# Patient Record
Sex: Female | Born: 1935
Health system: Southern US, Community
[De-identification: ages and names within clinical notes are randomized; demographics above are authoritative.]

## PROBLEM LIST (undated history)

## (undated) DIAGNOSIS — E079 Disorder of thyroid, unspecified: Secondary | ICD-10-CM

## (undated) DIAGNOSIS — I10 Essential (primary) hypertension: Secondary | ICD-10-CM

## (undated) DIAGNOSIS — K219 Gastro-esophageal reflux disease without esophagitis: Secondary | ICD-10-CM

## (undated) HISTORY — DX: Disorder of thyroid, unspecified: E07.9

## (undated) HISTORY — DX: Gastro-esophageal reflux disease without esophagitis: K21.9

## (undated) HISTORY — DX: Essential (primary) hypertension: I10

---

## 2005-07-24 ENCOUNTER — Other Ambulatory Visit: Payer: Self-pay

## 2005-07-24 ENCOUNTER — Inpatient Hospital Stay: Payer: Self-pay | Admitting: Surgery

## 2007-10-21 ENCOUNTER — Ambulatory Visit: Payer: Self-pay | Admitting: Family Medicine

## 2007-12-24 ENCOUNTER — Other Ambulatory Visit: Payer: Self-pay

## 2007-12-24 ENCOUNTER — Ambulatory Visit: Payer: Self-pay | Admitting: Obstetrics and Gynecology

## 2007-12-28 ENCOUNTER — Ambulatory Visit: Payer: Self-pay | Admitting: Obstetrics and Gynecology

## 2008-02-09 ENCOUNTER — Ambulatory Visit: Payer: Self-pay | Admitting: Obstetrics and Gynecology

## 2008-02-15 ENCOUNTER — Inpatient Hospital Stay: Payer: Self-pay | Admitting: Obstetrics and Gynecology

## 2008-11-01 ENCOUNTER — Ambulatory Visit: Payer: Self-pay | Admitting: Family Medicine

## 2014-04-28 DIAGNOSIS — E46 Unspecified protein-calorie malnutrition: Secondary | ICD-10-CM | POA: Diagnosis not present

## 2014-04-28 DIAGNOSIS — M810 Age-related osteoporosis without current pathological fracture: Secondary | ICD-10-CM | POA: Diagnosis not present

## 2014-04-28 DIAGNOSIS — E039 Hypothyroidism, unspecified: Secondary | ICD-10-CM | POA: Diagnosis not present

## 2014-04-28 DIAGNOSIS — K219 Gastro-esophageal reflux disease without esophagitis: Secondary | ICD-10-CM | POA: Diagnosis not present

## 2014-04-28 DIAGNOSIS — I10 Essential (primary) hypertension: Secondary | ICD-10-CM | POA: Diagnosis not present

## 2014-06-08 DIAGNOSIS — E119 Type 2 diabetes mellitus without complications: Secondary | ICD-10-CM | POA: Diagnosis not present

## 2014-06-08 DIAGNOSIS — H35039 Hypertensive retinopathy, unspecified eye: Secondary | ICD-10-CM | POA: Diagnosis not present

## 2014-06-08 DIAGNOSIS — I10 Essential (primary) hypertension: Secondary | ICD-10-CM | POA: Diagnosis not present

## 2014-06-08 DIAGNOSIS — Z961 Presence of intraocular lens: Secondary | ICD-10-CM | POA: Diagnosis not present

## 2014-09-16 ENCOUNTER — Other Ambulatory Visit: Payer: Self-pay | Admitting: Family Medicine

## 2014-09-16 MED ORDER — LEVOTHYROXINE SODIUM 125 MCG PO TABS: 125.0000 ug | ORAL_TABLET | Freq: Every day | ORAL | Status: DC

## 2014-10-31 ENCOUNTER — Encounter: Payer: Self-pay | Admitting: Family Medicine

## 2015-01-11 ENCOUNTER — Encounter: Payer: Self-pay | Admitting: Family Medicine

## 2015-01-27 ENCOUNTER — Other Ambulatory Visit: Payer: Self-pay | Admitting: Family Medicine

## 2015-02-13 ENCOUNTER — Other Ambulatory Visit: Payer: Self-pay | Admitting: Family Medicine

## 2015-02-22 ENCOUNTER — Other Ambulatory Visit: Payer: Self-pay | Admitting: Family Medicine

## 2015-03-18 ENCOUNTER — Other Ambulatory Visit: Payer: Self-pay | Admitting: Family Medicine

## 2015-03-28 ENCOUNTER — Encounter: Payer: Self-pay | Admitting: Family Medicine

## 2015-03-28 ENCOUNTER — Ambulatory Visit (INDEPENDENT_AMBULATORY_CARE_PROVIDER_SITE_OTHER): Payer: Medicare Other | Admitting: Family Medicine

## 2015-03-28 VITALS — BP 101/51 | HR 78 | Temp 97.3°F | Resp 16 | Ht 60.0 in | Wt 98.0 lb

## 2015-03-28 DIAGNOSIS — K219 Gastro-esophageal reflux disease without esophagitis: Secondary | ICD-10-CM | POA: Insufficient documentation

## 2015-03-28 DIAGNOSIS — I10 Essential (primary) hypertension: Secondary | ICD-10-CM | POA: Diagnosis not present

## 2015-03-28 DIAGNOSIS — R7303 Prediabetes: Secondary | ICD-10-CM

## 2015-03-28 DIAGNOSIS — Z23 Encounter for immunization: Secondary | ICD-10-CM | POA: Diagnosis not present

## 2015-03-28 DIAGNOSIS — M810 Age-related osteoporosis without current pathological fracture: Secondary | ICD-10-CM | POA: Diagnosis not present

## 2015-03-28 DIAGNOSIS — E039 Hypothyroidism, unspecified: Secondary | ICD-10-CM | POA: Diagnosis not present

## 2015-03-28 DIAGNOSIS — R7309 Other abnormal glucose: Secondary | ICD-10-CM | POA: Insufficient documentation

## 2015-03-28 LAB — POCT GLYCOSYLATED HEMOGLOBIN (HGB A1C): HEMOGLOBIN A1C: 5.9

## 2015-03-28 MED ORDER — LOSARTAN POTASSIUM 25 MG PO TABS: ORAL_TABLET | ORAL | Status: DC

## 2015-03-28 NOTE — Progress Notes (Signed)
Name: Megan RedderBarbara J Carpenter   MRN: [id number]    DOB: 01-Aug-1928   Date:03/28/2015       Progress Note  Subjective  Chief Complaint  Chief Complaint  Patient presents with  . Diabetes    Prediabetes 04/2013   A1C 6.3     HPI Here for f/u of pre-diabetes and hypothyroid.  Doing well.  Eats too many sweets.  Taking her meds.  No problem-specific assessment & plan notes found for this encounter.   Past Medical History  Diagnosis Date  . Hypertension   . Thyroid disease   . GERD (gastroesophageal reflux disease)     History reviewed. No pertinent past surgical history.  Family History  Problem Relation Age of Onset  . Family history unknown: Yes    Social History   Social History  . Marital Status: Married    Spouse Name: N/A  . Number of Children: N/A  . Years of Education: N/A   Occupational History  . Not on file.   Social History Main Topics  . Smoking status: Never Smoker   . Smokeless tobacco: Never Used  . Alcohol Use: No  . Drug Use: No  . Sexual Activity: Not on file   Other Topics Concern  . Not on file   Social History Narrative  . No narrative on file     Current outpatient prescriptions:  .  alendronate (FOSAMAX) 70 MG tablet, TAKE ONE TABLET BY MOUTH ONCE A WEEK AS DIRECTED, Disp: 4 tablet, Rfl: 6 .  levothyroxine (SYNTHROID, LEVOTHROID) 125 MCG tablet, TAKE ONE (1) TABLET BY MOUTH EVERY DAY BEFORE BREAKFAST, Disp: 35 tablet, Rfl: 0 .  omeprazole (PRILOSEC) 20 MG capsule, TAKE ONE (1) CAPSULE EACH DAY, Disp: 35 capsule, Rfl: 0 .  losartan (COZAAR) 25 MG tablet, Take 1/2 tablet each AM., Disp: 30 tablet, Rfl: 6  Not on File   Review of Systems  Constitutional: Negative for fever, chills, weight loss and malaise/fatigue.  HENT: Negative for hearing loss.   Eyes: Negative for blurred vision and double vision.  Respiratory: Negative for cough, shortness of breath and wheezing.   Cardiovascular: Negative for chest pain, palpitations and leg  swelling.  Gastrointestinal: Negative for heartburn, abdominal pain and blood in stool.  Genitourinary: Negative for dysuria, urgency and frequency.  Musculoskeletal: Negative for myalgias and joint pain.  Skin: Negative for rash.  Neurological: Negative for dizziness, weakness and headaches.      Objective  Filed Vitals:   03/28/15 0925  BP: 101/51  Pulse: 78  Temp: 97.3 F (36.3 C)  Resp: 16  Height: 5' (1.524 m)  Weight: 98 lb (44.453 kg)    Physical Exam  Constitutional: She is oriented to person, place, and time and well-developed, well-nourished, and in no distress. No distress.  HENT:  Head: Normocephalic and atraumatic.  Eyes: Conjunctivae and EOM are normal. Pupils are equal, round, and reactive to light. No scleral icterus.  Neck: Normal range of motion. Neck supple. Carotid bruit is not present. No thyromegaly present.  Cardiovascular: Normal rate, regular rhythm, normal heart sounds and intact distal pulses.  Exam reveals no gallop and no friction rub.   No murmur heard. Pulmonary/Chest: Effort normal and breath sounds normal. No respiratory distress. She has no wheezes. She has no rales.  Abdominal: Soft. Bowel sounds are normal. She exhibits no distension, no abdominal bruit and no mass. There is no tenderness.  Musculoskeletal: She exhibits no edema.  Lymphadenopathy:    She has no cervical  adenopathy.  Neurological: She is alert and oriented to person, place, and time. Coordination normal.  Vitals reviewed.      Recent Results (from the past 2160 hour(s))  POCT HgB A1C     Status: Normal   Collection Time: 03/28/15  9:51 AM  Result Value Ref Range   Hemoglobin A1C 5.9      Assessment & Plan  Problem List Items Addressed This Visit      Cardiovascular and Mediastinum   Hypertension   Relevant Medications   losartan (COZAAR) 25 MG tablet   Other Relevant Orders   Comprehensive Metabolic Panel (CMET)   Lipid Profile     Digestive   GERD  (gastroesophageal reflux disease)   Relevant Orders   CBC with Differential     Endocrine   Hypothyroidism   Relevant Orders   TSH     Musculoskeletal and Integument   Osteoporosis     Other   Prediabetes - Primary   Relevant Orders   POCT HgB A1C (Completed)    Other Visit Diagnoses    Need for influenza vaccination        Relevant Orders    Flu vaccine HIGH DOSE PF (Fluzone High dose) (Completed)    Need for pneumococcal vaccination        Relevant Orders    Pneumococcal polysaccharide vaccine 23-valent greater than or equal to 2yo subcutaneous/IM       Meds ordered this encounter  Medications  . DISCONTD: losartan (COZAAR) 25 MG tablet    Sig:   . losartan (COZAAR) 25 MG tablet    Sig: Take 1/2 tablet each AM.    Dispense:  30 tablet    Refill:  6   1. Prediabetes  - POCT HgB A1C-5.9  2. Gastroesophageal reflux disease without esophagitis -con t. meds - CBC with Differential  3. Essential hypertension  - losartan (COZAAR) 25 MG tablet; Take 1/2 tablet each AM.  Dispense: 30 tablet; Refill: 6 - Comprehensive Metabolic Panel (CMET) - Lipid Profile  4. Hypothyroidism, unspecified hypothyroidism type  - TSH  5. Osteoporosis -cont. meds  6. Need for influenza vaccination  - Flu vaccine HIGH DOSE PF (Fluzone High dose)  7. Need for pneumococcal vaccination  - Pneumococcal polysaccharide vaccine 23-valent greater than or equal to 2yo subcutaneous/IM

## 2015-03-30 DIAGNOSIS — K219 Gastro-esophageal reflux disease without esophagitis: Secondary | ICD-10-CM | POA: Diagnosis not present

## 2015-03-30 DIAGNOSIS — I10 Essential (primary) hypertension: Secondary | ICD-10-CM | POA: Diagnosis not present

## 2015-03-30 DIAGNOSIS — E039 Hypothyroidism, unspecified: Secondary | ICD-10-CM | POA: Diagnosis not present

## 2015-03-30 LAB — CBC WITH DIFFERENTIAL/PLATELET
BASOS: 0 %
Basophils Absolute: 0 10*3/uL (ref 0.0–0.2)
EOS (ABSOLUTE): 0.1 10*3/uL (ref 0.0–0.4)
EOS: 1 %
HEMATOCRIT: 42.2 % (ref 34.0–46.6)
HEMOGLOBIN: 14.9 g/dL (ref 11.1–15.9)
IMMATURE GRANS (ABS): 0 10*3/uL (ref 0.0–0.1)
IMMATURE GRANULOCYTES: 0 %
LYMPHS: 30 %
Lymphocytes Absolute: 2.3 10*3/uL (ref 0.7–3.1)
MCH: 30.7 pg (ref 26.6–33.0)
MCHC: 35.3 g/dL (ref 31.5–35.7)
MCV: 87 fL (ref 79–97)
Monocytes Absolute: 0.7 10*3/uL (ref 0.1–0.9)
Monocytes: 9 %
NEUTROS PCT: 60 %
Neutrophils Absolute: 4.5 10*3/uL (ref 1.4–7.0)
PLATELETS: 293 10*3/uL (ref 150–379)
RBC: 4.86 x10E6/uL (ref 3.77–5.28)
RDW: 13.1 % (ref 12.3–15.4)
WBC: 7.6 10*3/uL (ref 3.4–10.8)

## 2015-03-31 LAB — COMPREHENSIVE METABOLIC PANEL
A/G RATIO: 1.8 (ref 1.1–2.5)
ALT: 8 IU/L (ref 0–32)
AST: 13 IU/L (ref 0–40)
Albumin: 4.6 g/dL (ref 3.5–4.7)
Alkaline Phosphatase: 61 IU/L (ref 39–117)
BUN/Creatinine Ratio: 20 (ref 11–26)
BUN: 15 mg/dL (ref 8–27)
Bilirubin Total: 0.5 mg/dL (ref 0.0–1.2)
CALCIUM: 9.2 mg/dL (ref 8.7–10.3)
CHLORIDE: 102 mmol/L (ref 96–106)
CO2: 24 mmol/L (ref 18–29)
Creatinine, Ser: 0.75 mg/dL (ref 0.57–1.00)
GFR calc Af Amer: 85 mL/min/{1.73_m2} (ref >59)
GFR, EST NON AFRICAN AMERICAN: 73 mL/min/{1.73_m2} (ref >59)
Globulin, Total: 2.5 g/dL (ref 1.5–4.5)
Glucose: 113 mg/dL — ABNORMAL HIGH (ref 65–99)
POTASSIUM: 4.8 mmol/L (ref 3.5–5.2)
Sodium: 142 mmol/L (ref 134–144)
Total Protein: 7.1 g/dL (ref 6.0–8.5)

## 2015-03-31 LAB — LIPID PANEL
CHOL/HDL RATIO: 3.1 ratio (ref 0.0–4.4)
Cholesterol, Total: 175 mg/dL (ref 100–199)
HDL: 57 mg/dL (ref >39)
LDL Calculated: 102 mg/dL — ABNORMAL HIGH (ref 0–99)
TRIGLYCERIDES: 81 mg/dL (ref 0–149)
VLDL Cholesterol Cal: 16 mg/dL (ref 5–40)

## 2015-03-31 LAB — TSH: TSH: 2.6 u[IU]/mL (ref 0.450–4.500)

## 2015-04-04 ENCOUNTER — Other Ambulatory Visit: Payer: Self-pay | Admitting: Family Medicine

## 2015-10-18 ENCOUNTER — Other Ambulatory Visit: Payer: Self-pay | Admitting: Family Medicine

## 2015-11-27 ENCOUNTER — Ambulatory Visit (INDEPENDENT_AMBULATORY_CARE_PROVIDER_SITE_OTHER): Payer: Medicare Other | Admitting: Family Medicine

## 2015-11-27 ENCOUNTER — Encounter: Payer: Self-pay | Admitting: Family Medicine

## 2015-11-27 VITALS — BP 130/78 | HR 65 | Temp 97.8°F | Ht 60.0 in | Wt 91.0 lb

## 2015-11-27 DIAGNOSIS — I10 Essential (primary) hypertension: Secondary | ICD-10-CM | POA: Diagnosis not present

## 2015-11-27 DIAGNOSIS — E038 Other specified hypothyroidism: Secondary | ICD-10-CM

## 2015-11-27 DIAGNOSIS — E034 Atrophy of thyroid (acquired): Secondary | ICD-10-CM | POA: Diagnosis not present

## 2015-11-27 DIAGNOSIS — F0390 Unspecified dementia without behavioral disturbance: Secondary | ICD-10-CM | POA: Diagnosis not present

## 2015-11-27 DIAGNOSIS — M810 Age-related osteoporosis without current pathological fracture: Secondary | ICD-10-CM

## 2015-11-27 DIAGNOSIS — F02818 Dementia in other diseases classified elsewhere, unspecified severity, with other behavioral disturbance: Secondary | ICD-10-CM | POA: Insufficient documentation

## 2015-11-27 DIAGNOSIS — K219 Gastro-esophageal reflux disease without esophagitis: Secondary | ICD-10-CM | POA: Diagnosis not present

## 2015-11-27 DIAGNOSIS — R7303 Prediabetes: Secondary | ICD-10-CM

## 2015-11-27 DIAGNOSIS — F0281 Dementia in other diseases classified elsewhere with behavioral disturbance: Secondary | ICD-10-CM | POA: Insufficient documentation

## 2015-11-27 DIAGNOSIS — G301 Alzheimer's disease with late onset: Secondary | ICD-10-CM

## 2015-11-27 LAB — POCT GLYCOSYLATED HEMOGLOBIN (HGB A1C): Hemoglobin A1C: 6.2

## 2015-11-27 MED ORDER — DONEPEZIL HCL 5 MG PO TABS: 5.0000 mg | ORAL_TABLET | Freq: Every day | ORAL | 6 refills | Status: DC

## 2015-11-27 NOTE — Progress Notes (Signed)
Name: Megan RedderBarbara J Carpenter   MRN: [id number]    DOB: [birth year]   Date:11/27/2015       Progress Note  Subjective  Chief Complaint  Chief Complaint  Patient presents with  . Hypertension  . Hypothyroidism  . Dementia    HPI  Here for f/u of HBP and pre-diabetes.  Having more dementia sx.  Dementia sx are bothering daughter more than anything ewlse.  No problem-specific Assessment & Plan notes found for this encounter.   Past Medical History:  Diagnosis Date  . GERD (gastroesophageal reflux disease)   . Hypertension   . Thyroid disease     No past surgical history on file.  Family History  Problem Relation Age of Onset  . Family history unknown: Yes    Social History   Social History  . Marital status: Married    Spouse name: N/A  . Number of children: N/A  . Years of education: N/A   Occupational History  . Not on file.   Social History Main Topics  . Smoking status: Never Smoker  . Smokeless tobacco: Never Used  . Alcohol use No  . Drug use: No  . Sexual activity: Not on file   Other Topics Concern  . Not on file   Social History Narrative  . No narrative on file     Current Outpatient Prescriptions:  .  alendronate (FOSAMAX) 70 MG tablet, TAKE ONE TABLET BY MOUTH ONCE A WEEK AS DIRECTED, Disp: 4 tablet, Rfl: 6 .  levothyroxine (SYNTHROID, LEVOTHROID) 125 MCG tablet, TAKE ONE (1) TABLET BY MOUTH EVERY DAY BEFORE BREAKFAST, Disp: 35 tablet, Rfl: 6 .  losartan (COZAAR) 25 MG tablet, Take 1/2 tablet each AM., Disp: 30 tablet, Rfl: 6 .  omeprazole (PRILOSEC) 20 MG capsule, TAKE ONE CAPSULE BY MOUTH DAILY, Disp: 30 capsule, Rfl: 0 .  donepezil (ARICEPT) 5 MG tablet, Take 1 tablet (5 mg total) by mouth at bedtime., Disp: 30 tablet, Rfl: 6  Allergies  Allergen Reactions  . Penicillins Hives     Review of Systems  Constitutional: Positive for weight loss. Negative for chills, fever and malaise/fatigue.  HENT: Negative for hearing loss.   Eyes: Negative  for blurred vision and double vision.  Respiratory: Negative for cough, shortness of breath and wheezing.   Cardiovascular: Negative for chest pain, palpitations and leg swelling.  Gastrointestinal: Negative for abdominal pain, blood in stool and heartburn.  Genitourinary: Negative for dysuria, frequency and urgency.  Skin: Negative for rash.  Neurological: Negative for dizziness, tremors, weakness and headaches.      Objective  Vitals:   11/27/15 1102  BP: 130/78  Pulse: 65  Temp: 97.8 F (36.6 C)  TempSrc: Oral  Weight: 91 lb (41.3 kg)  Height: 5' (1.524 m)    Physical Exam  Constitutional: She is oriented to person, place, and time and well-developed, well-nourished, and in no distress. No distress.  HENT:  Head: Normocephalic and atraumatic.  Eyes: Conjunctivae and EOM are normal. Pupils are equal, round, and reactive to light. No scleral icterus.  Neck: Normal range of motion. Carotid bruit is not present.  Cardiovascular: Normal rate, regular rhythm and normal heart sounds.  Exam reveals no gallop and no friction rub.   No murmur heard. Pulmonary/Chest: Effort normal and breath sounds normal. No respiratory distress. She has no wheezes. She has no rales.  Abdominal: Soft. Bowel sounds are normal. She exhibits no distension and no mass. There is no tenderness.  Musculoskeletal: She exhibits no  edema.  Lymphadenopathy:    She has no cervical adenopathy.  Neurological: She is alert and oriented to person, place, and time.  Vitals reviewed.      Recent Results (from the past 2160 hour(s))  POCT HgB A1C     Status: None   Collection Time: 11/27/15 12:02 PM  Result Value Ref Range   Hemoglobin A1C 6.2      Assessment & Plan  Problem List Items Addressed This Visit      Cardiovascular and Mediastinum   Hypertension     Digestive   GERD (gastroesophageal reflux disease)     Endocrine   Hypothyroidism     Nervous and Auditory   Dementia   Relevant  Medications   donepezil (ARICEPT) 5 MG tablet     Musculoskeletal and Integument   Osteoporosis     Other   Prediabetes - Primary   Relevant Orders   POCT HgB A1C (Completed)    Other Visit Diagnoses   None.     Meds ordered this encounter  Medications  . donepezil (ARICEPT) 5 MG tablet    Sig: Take 1 tablet (5 mg total) by mouth at bedtime.    Dispense:  30 tablet    Refill:  6   1. Prediabetes  - POCT HgB A1C-6.2  2. Essential hypertension Cont med  3. Hypothyroidism due to acquired atrophy of thyroid Cont med  4. Gastroesophageal reflux disease without esophagitis Cont med 5. Osteoporosis Cont med  6. Dementia, without behavioral disturbance  - donepezil (ARICEPT) 5 MG tablet; Take 1 tablet (5 mg total) by mouth at bedtime.  Dispense: 30 tablet; Refill: 6

## 2015-11-27 NOTE — Patient Instructions (Addendum)
[facility] for [facility] during the day.

## 2015-11-29 ENCOUNTER — Other Ambulatory Visit: Payer: Self-pay | Admitting: *Deleted

## 2015-11-29 DIAGNOSIS — G3183 Dementia with Lewy bodies: Principal | ICD-10-CM

## 2015-11-29 DIAGNOSIS — F0280 Dementia in other diseases classified elsewhere without behavioral disturbance: Secondary | ICD-10-CM

## 2015-11-29 NOTE — Patient Outreach (Addendum)
[facility]) Care Management  11/29/2015  Megan RedderBarbara J Hogan 1929/03/16 [id number]   Subjective: Telephone call to patient's home/ mobile number, spoke with patient's daughter Larene Beach(Vickie Boltinghouse), states she is currently working, and patient can be reached at [phone].   RNCM advised must obtain authorization from patient prior to discussing the nature of call. Telephone to patient's home number ([phone]), spoke with patient, and HIPAA verified.   [facility] gave [facility] verbal authorization to speak with daughter Larene Beach(Vickie Boltinghouse) regarding her healthcare needs as needed.   Telephone call to patient's daughter Larene Beach(Vickie Boltinghouse), spoke with daughter, patient's date of birth, address, and name verified.  Discussed [facility] services, reason for referral, and patient's daughter in agreement to complete telephone screen. Patient's daughter states she has someone clean patient's house, take patient to lunch, and get her hair done once a week.  States patient has dementia and hypertension.  States patient ambulates without assistive device.  States patient has not had any recent  falls, inpatient hospitalizations, or ED visits.   Patient has no transportation, pharmacy, disease management, disease monitoring, or care coordination needs at this time.   Daughter in agreement to [facility] Social Worker referral for respite, personal care, community outing activities, and Architectural technologistcommunity resource identification.  States patient may not be eligible for Medicaid due to rental property assets and may need dementia care assisted living in the future.  States goal is to keep patient in patient's home as long as possible.  Daughter states she will be going out of town for 10 days in September and will need caregiver assistance. States she is in the process of arranging assistance while out of town.   Patient will [facility] to receive [facility] services.  Objective:  Per chart review: No recent hospitalizations or ED visit.  Patient has a history of hypertension, dementia, hypothyroidism, and pre-diabetes.  Patient's last visit with primary MD was on  11/27/15.    Assessment: Received MD referral on 11/27/15.   Referral source: Dr. Fidel LevyJames Hawkins Jr.   Referral reason: Dementia lives alone.    Telephone screen completed.  Patient will be referred to [facility] Care Management Social Worker for [employer] resources.   No Telephonic RNCM needs at this time.   Plan: RNCM will refer patient to [facility] Social Worker referral for respite, personal care, community outing activities, and Architectural technologistcommunity resource identification.    Ashima Shrake H. Gardiner Barefootooper RN, BSN, CCM [facility] Care Management [facility] Telephonic CM Phone: [phone] Fax: [phone]

## 2015-12-01 ENCOUNTER — Encounter: Payer: Self-pay | Admitting: *Deleted

## 2015-12-01 ENCOUNTER — Other Ambulatory Visit: Payer: Self-pay | Admitting: *Deleted

## 2015-12-01 NOTE — Patient Outreach (Signed)
[facility]) Care Management  12/01/2015  Megan Carpenter 11/17/1928 [id number]   Phone call to patient's daughter, with patient's consent to provide resources for in home support.  Per patient's daughter, she is looking for additional in home assistance for patient and options for outside activities. Assisted Living pursued in the past, however patient refused.  Per patient's daughter, they would like patient to remain in the home as long as possible, however is aware that  if out of home placement is needed, she would need memory care.   Per patient's daughter, patient lives alone and is beginning to require more supervision.  Patient has someone that will take her to lunch, get her hair done, and clean her home and per patient's daughter, she looks forward to the company. However the person that was taking her out recently broke her foot and will not be able to assist until she recovers.  Finances are limited as they are only working with patient's social security.  Eligibility for medicaid is questionable due to property that patient owns.  This Child psychotherapistsocial worker provided patient's daughter with the contact information for the [facility] [phone] as a possible options for support during the day.  [facility] [place] also discussed as an option.  Hiring a private duty aid also explored. Contacting [facility] [phone] for additional resources and respite also discussed.  Per patient's daughter, patient very hesitant to leave her home to the unknown, however she will look into the Adult Day Program.  Patient's daughter also plans to request that her family members take more of an active part in patient's care.  She also plans to possible increase the hours that her current help is providing on a private pay basis.  Patient's daughter has already spoke to [facility] and is working with them to obtain any available respite care  hours.  Patient's daughter verbalized having no further community resource needs and reports that she will call this social worker back if she has any additional questions or needs.  Plan: Case to be closed to [facility] care management at this time-community resources provided.  Adriana ReamsChrystal Land, LCSW [facility] Care Management [phone]

## 2015-12-06 ENCOUNTER — Other Ambulatory Visit: Payer: Self-pay | Admitting: Family Medicine

## 2016-01-29 ENCOUNTER — Ambulatory Visit: Payer: Medicare Other | Admitting: Family Medicine

## 2016-02-15 ENCOUNTER — Other Ambulatory Visit: Payer: Self-pay | Admitting: Family Medicine

## 2016-02-26 ENCOUNTER — Ambulatory Visit (INDEPENDENT_AMBULATORY_CARE_PROVIDER_SITE_OTHER): Payer: Medicare Other | Admitting: Family Medicine

## 2016-02-26 ENCOUNTER — Encounter: Payer: Self-pay | Admitting: Family Medicine

## 2016-02-26 VITALS — BP 137/81 | HR 75 | Temp 97.5°F | Resp 16 | Ht 60.0 in | Wt 93.0 lb

## 2016-02-26 DIAGNOSIS — R7303 Prediabetes: Secondary | ICD-10-CM

## 2016-02-26 DIAGNOSIS — E038 Other specified hypothyroidism: Secondary | ICD-10-CM | POA: Diagnosis not present

## 2016-02-26 DIAGNOSIS — K219 Gastro-esophageal reflux disease without esophagitis: Secondary | ICD-10-CM

## 2016-02-26 DIAGNOSIS — E063 Autoimmune thyroiditis: Secondary | ICD-10-CM | POA: Diagnosis not present

## 2016-02-26 DIAGNOSIS — F0390 Unspecified dementia without behavioral disturbance: Secondary | ICD-10-CM

## 2016-02-26 DIAGNOSIS — Z23 Encounter for immunization: Secondary | ICD-10-CM | POA: Diagnosis not present

## 2016-02-26 DIAGNOSIS — I10 Essential (primary) hypertension: Secondary | ICD-10-CM

## 2016-02-26 DIAGNOSIS — M8000XA Age-related osteoporosis with current pathological fracture, unspecified site, initial encounter for fracture: Secondary | ICD-10-CM

## 2016-02-26 NOTE — Progress Notes (Signed)
Name: Megan RedderBarbara J Carpenter   MRN: [id number]    DOB: 1929-02-24   Date:02/26/2016       Progress Note  Subjective  Chief Complaint  Chief Complaint  Patient presents with  . Weight Check    up 2 lbs since 11/2015  . Dementia    HPI Here to f/u weight and dementia.  Daughter and son decided not to start Aricept yet.  They will wait for now.  Patient ha eaten a little better since last seen.  Ancil BoozerFam ily is checking on Meals on Wheels also.  No problem-specific Assessment & Plan notes found for this encounter.   Past Medical History:  Diagnosis Date  . GERD (gastroesophageal reflux disease)   . Hypertension   . Thyroid disease     History reviewed. No pertinent surgical history.  Family History  Problem Relation Age of Onset  . Family history unknown: Yes    Social History   Social History  . Marital status: Married    Spouse name: N/A  . Number of children: N/A  . Years of education: N/A   Occupational History  . Not on file.   Social History Main Topics  . Smoking status: Never Smoker  . Smokeless tobacco: Never Used  . Alcohol use No  . Drug use: No  . Sexual activity: Not on file   Other Topics Concern  . Not on file   Social History Narrative  . No narrative on file     Current Outpatient Prescriptions:  .  alendronate (FOSAMAX) 70 MG tablet, TAKE 1 TABLET EACH WEEK OR AS DIRECTED, Disp: 4 tablet, Rfl: 12 .  levothyroxine (SYNTHROID, LEVOTHROID) 125 MCG tablet, TAKE ONE TABLET BY MOUTH EVERY DAY BEFORE BREAKFAST, Disp: 35 tablet, Rfl: 12 .  losartan (COZAAR) 25 MG tablet, Take 1/2 tablet each AM., Disp: 30 tablet, Rfl: 6 .  omeprazole (PRILOSEC) 20 MG capsule, TAKE ONE CAPSULE BY MOUTH DAILY, Disp: 30 capsule, Rfl: 0  Allergies  Allergen Reactions  . Penicillins Hives     Review of Systems  Constitutional: Negative for chills, fever, malaise/fatigue and weight loss.  HENT: Negative for hearing loss and tinnitus.   Eyes: Negative for blurred vision  and double vision.  Respiratory: Negative for cough, shortness of breath and wheezing.   Cardiovascular: Negative for chest pain, palpitations and leg swelling.  Gastrointestinal: Negative for abdominal pain, blood in stool and heartburn.  Genitourinary: Negative for dysuria, frequency and urgency.  Musculoskeletal: Negative for joint pain and myalgias.  Skin: Negative for rash.  Neurological: Negative for dizziness, tingling, tremors, weakness and headaches.  Psychiatric/Behavioral:       Mild dementia by hx.      Objective  Vitals:   02/26/16 1049  BP: 137/81  Pulse: 75  Resp: 16  Temp: 97.5 F (36.4 C)  TempSrc: Oral  Weight: 93 lb (42.2 kg)  Height: 5' (1.524 m)    Physical Exam  Constitutional: She is oriented to person, place, and time and well-developed, well-nourished, and in no distress. No distress.  HENT:  Head: Normocephalic and atraumatic.  Eyes: Conjunctivae and EOM are normal. Pupils are equal, round, and reactive to light. No scleral icterus.  Neck: Normal range of motion. Neck supple. Carotid bruit is not present. No thyromegaly present.  Cardiovascular: Normal rate, regular rhythm and normal heart sounds.  Exam reveals no gallop and no friction rub.   No murmur heard. Pulmonary/Chest: Effort normal and breath sounds normal. No respiratory distress. She has  no wheezes. She has no rales.  Abdominal: Soft. Bowel sounds are normal. She exhibits no distension and no mass. There is no tenderness.  Musculoskeletal: She exhibits no edema.  Lymphadenopathy:    She has no cervical adenopathy.  Neurological: She is alert and oriented to person, place, and time.  Vitals reviewed.      No results found for this or any previous visit (from the past 2160 hour(s)).   Assessment & Plan  Problem List Items Addressed This Visit      Cardiovascular and Mediastinum   Hypertension   Relevant Orders   COMPLETE METABOLIC PANEL WITH GFR     Digestive   GERD  (gastroesophageal reflux disease)   Relevant Orders   CBC with Differential     Endocrine   Hypothyroidism   Relevant Orders   TSH     Nervous and Auditory   Dementia     Musculoskeletal and Integument   Osteoporosis     Other   Prediabetes   Relevant Orders   HgB A1c    Other Visit Diagnoses    Need for vaccination    -  Primary   Relevant Orders   Flu vaccine HIGH DOSE PF (Fluzone High dose) (Completed)      No orders of the defined types were placed in this encounter.  1. Need for vaccination  - Flu vaccine HIGH DOSE PF (Fluzone High dose)  2. Essential hypertension Cont Losartan - COMPLETE METABOLIC PANEL WITH GFR  3. Gastroesophageal reflux disease without esophagitis Cont Omeprazole - CBC with Differential  4. Hypothyroidism due to Hashimoto's thyroiditis Cont Levothyroxine-  TSH  5. Dementia without behavioral disturbance, unspecified dementia type   6. Age-related osteoporosis with current pathological fracture, initial encounter Cont Fosamax  7. Prediabetes  - HgB A1c

## 2016-02-27 ENCOUNTER — Other Ambulatory Visit: Payer: Medicare Other

## 2016-02-28 LAB — CBC WITH DIFFERENTIAL/PLATELET
BASOS ABS: 0 {cells}/uL (ref 0–200)
Basophils Relative: 0 %
EOS PCT: 1 %
Eosinophils Absolute: 58 cells/uL (ref 15–500)
HCT: 43 % (ref 35.0–45.0)
Hemoglobin: 14.3 g/dL (ref 11.7–15.5)
LYMPHS ABS: 1566 {cells}/uL (ref 850–3900)
Lymphocytes Relative: 27 %
MCH: 29.7 pg (ref 27.0–33.0)
MCHC: 33.3 g/dL (ref 32.0–36.0)
MCV: 89.4 fL (ref 80.0–100.0)
MONOS PCT: 13 %
MPV: 10.3 fL (ref 7.5–12.5)
Monocytes Absolute: 754 cells/uL (ref 200–950)
NEUTROS PCT: 59 %
Neutro Abs: 3422 cells/uL (ref 1500–7800)
PLATELETS: 266 10*3/uL (ref 140–400)
RBC: 4.81 MIL/uL (ref 3.80–5.10)
RDW: 13.6 % (ref 11.0–15.0)
WBC: 5.8 10*3/uL (ref 3.8–10.8)

## 2016-02-28 LAB — COMPLETE METABOLIC PANEL WITH GFR
ALBUMIN: 4.3 g/dL (ref 3.6–5.1)
ALK PHOS: 59 U/L (ref 33–130)
ALT: 14 U/L (ref 6–29)
AST: 22 U/L (ref 10–35)
BILIRUBIN TOTAL: 0.5 mg/dL (ref 0.2–1.2)
BUN: 16 mg/dL (ref 7–25)
CALCIUM: 9.1 mg/dL (ref 8.6–10.4)
CO2: 28 mmol/L (ref 20–31)
Chloride: 101 mmol/L (ref 98–110)
Creat: 0.73 mg/dL (ref 0.60–0.88)
GFR, EST NON AFRICAN AMERICAN: 75 mL/min (ref >=60)
GFR, Est African American: 87 mL/min (ref >=60)
GLUCOSE: 163 mg/dL — AB (ref 65–99)
POTASSIUM: 4.3 mmol/L (ref 3.5–5.3)
SODIUM: 139 mmol/L (ref 135–146)
TOTAL PROTEIN: 6.8 g/dL (ref 6.1–8.1)

## 2016-02-28 LAB — TSH: TSH: 0.05 m[IU]/L — AB

## 2016-02-28 LAB — HEMOGLOBIN A1C
HEMOGLOBIN A1C: 5.9 % — AB (ref <5.7)
Mean Plasma Glucose: 123 mg/dL

## 2016-03-01 ENCOUNTER — Other Ambulatory Visit: Payer: Self-pay | Admitting: *Deleted

## 2016-03-01 MED ORDER — LEVOTHYROXINE SODIUM 100 MCG PO TABS: ORAL_TABLET | ORAL | 12 refills | Status: DC

## 2016-03-13 ENCOUNTER — Ambulatory Visit (INDEPENDENT_AMBULATORY_CARE_PROVIDER_SITE_OTHER): Payer: Medicare Other | Admitting: Family Medicine

## 2016-03-13 ENCOUNTER — Encounter: Payer: Self-pay | Admitting: Family Medicine

## 2016-03-13 VITALS — BP 107/71 | HR 68 | Temp 98.1°F | Resp 16 | Ht 60.0 in | Wt 94.0 lb

## 2016-03-13 DIAGNOSIS — H9193 Unspecified hearing loss, bilateral: Secondary | ICD-10-CM | POA: Diagnosis not present

## 2016-03-13 DIAGNOSIS — H6123 Impacted cerumen, bilateral: Secondary | ICD-10-CM

## 2016-03-13 NOTE — Assessment & Plan Note (Signed)
Significant amount of large thick impacted cerumen bilaterally, suspected primary cause of current reduced bilateral hearing and symptoms. Consider presbycusis in 6 yr patient, also some cognitive difficulty with alzheimer's dementia could be a factor but seems less likely, especially given history of more recent change over few weeks not gradual loss. No prior hearing aids or hearing evaluation.  Plan: 1. Attempted office ear lavage cerumen removal without much success, noticeable softening and partial removal, but still deeper cerumen impaction. 2. Trial on OTC Debrox drops removal kit and flushing at home 3. Notify office if not improving 2-4 weeks, may return for re-evaluation or as discussed can proceed with ENT referral for formal cerumen removal and consider audiology hearing evaluation

## 2016-03-13 NOTE — Patient Instructions (Signed)
Thank you for coming in to clinic today.  1. She has bilateral impacted ear wax (cerumen), it does appear to have loosened up and is much softer after the ear drops and the flushing, I was able to scrap small fragments out but the larger pieces are still deeper in.  Recommend using the same ear drops at home, over the counter Debrox (Carbamide peroxide), use on both sides, pharmacist will direct you to the appropriate ear drops if you need help. May take a week or more.  You may use a Q-tip only on outer ear rim if you see wax visible, try not to push deeper, you can use rolled up kleenex as a wick to absorb fluid and wax as well.  If you are not making progress let me know, call and leave a message and we can place referral to get her ears 100% clear, hopefully do not even need hearing aids or other testing, but this may be useful as well.  [facility] [address]  [place], [place] [address] Ph: [phone]  [facility] [address]  [place], [place] [address] Ph: [phone]  Linus Salmonshapman McQueen, MD Marion DownerScott Bennett, MD Bud Facereighton Vaught, MD  Please schedule a follow-up appointment with Dr. Althea CharonKaramalegos or Dr Juanetta GoslingHawkins as needed within 4 weeks for ear wax or hearing loss  If you have any other questions or concerns, please feel free to call the clinic or send a message through MyChart. You may also schedule an earlier appointment if necessary.  Saralyn PilarAlexander Karamalegos, DO [facility]

## 2016-03-13 NOTE — Progress Notes (Signed)
Subjective:    Patient ID: Megan Carpenter, female    DOB: July 24, 1928, 82 y.o.   MRN: [id number]  Megan Carpenter is a 82 y.o. female presenting on 03/13/2016 for Ear Pain (as per Daughter can't hear noticed from last couple of days may need referral to ENT)  Daughter Megan Carpenter [facility]) provided history primarily for patient, patient provides some history as well, limited by dementia.  HPI  REDUCED HEARING, Bilateral / Ear Wax: - Reports that patient has had worsening hearing difficulty over past 2-3 weeks, which is a new change as she previously did not have significant problems with hearing previously. Hearing with conversational speech is difficult currently, family has to repeat things 2-3 times, and patient admits to difficulty hearing them and hard for her to hear herself clearly as well with some muffled sounds. - PMH Alzheimer's Dementia, lives by herself has [facility] aides and caregivers - Previously established with [facility] several years ago for dysphagia secondary to paralyzed vocal cord, has never had hearing evaluation. No prior hearing aids. She did require wax cleaning years ago. - Denies any tinnitus, ear pressure pain or drainage, headache, fever/chills, ear or head injury, numbness, tingling, dizziness, vertigo  Social History  Substance Use Topics  . Smoking status: Never Smoker  . Smokeless tobacco: Never Used  . Alcohol use No    Review of Systems Per HPI unless specifically indicated above     Objective:    BP 107/71   Pulse 68   Temp 98.1 F (36.7 C) (Oral)   Resp 16   Ht 5' (1.524 m)   Wt 94 lb (42.6 kg)   BMI 18.36 kg/m   Wt Readings from Last 3 Encounters:  03/13/16 94 lb (42.6 kg)  02/26/16 93 lb (42.2 kg)  11/27/15 91 lb (41.3 kg)    Physical Exam  Constitutional: She appears well-developed and well-nourished. No distress.  Well-appearing, comfortable, cooperative  HENT:  Head: Normocephalic and atraumatic.  Frontal / maxillary sinuses  non-tender. Nares patent without purulence or edema. Oropharynx clear without erythema, exudates, edema or asymmetry.   Bilateral TMs not visible obstructed by significant amount of hard thick impacted cerumen. External ears and mastoid non tender. --------- S/p treatment by nurse with Ear Drops (Carbamide peroxide), and Ear Flushing (warm water, hydrogen peroxide) - Significant softening of ear wax with some removal but still deeper thick impacted cerumen. Attempted curette removal, only partially removed limited results.  Cardiovascular: Normal rate.   Pulmonary/Chest: Effort normal.  Neurological: She is alert.  Skin: Skin is warm and dry. No rash noted. She is not diaphoretic. No erythema.  Psychiatric: Her behavior is normal.  Nursing note and vitals reviewed.   I have personally reviewed the following lab results from 02/26/16.  Results for orders placed or performed in visit on 02/26/16  COMPLETE METABOLIC PANEL WITH GFR  Result Value Ref Range   Sodium 139 135 - 146 mmol/L   Potassium 4.3 3.5 - 5.3 mmol/L   Chloride 101 98 - 110 mmol/L   CO2 28 20 - 31 mmol/L   Glucose, Bld 163 (H) 65 - 99 mg/dL   BUN 16 7 - 25 mg/dL   Creat 0.73 0.60 - 0.88 mg/dL   Total Bilirubin 0.5 0.2 - 1.2 mg/dL   Alkaline Phosphatase 59 33 - 130 U/L   AST 22 10 - 35 U/L   ALT 14 6 - 29 U/L   Total Protein 6.8 6.1 - 8.1 g/dL   Albumin  4.3 3.6 - 5.1 g/dL   Calcium 9.1 8.6 - 10.4 mg/dL   GFR, Est African American 87 >=60 mL/min   GFR, Est Non African American 75 >=60 mL/min  CBC with Differential  Result Value Ref Range   WBC 5.8 3.8 - 10.8 K/uL   RBC 4.81 3.80 - 5.10 MIL/uL   Hemoglobin 14.3 11.7 - 15.5 g/dL   HCT 43.0 35.0 - 45.0 %   MCV 89.4 80.0 - 100.0 fL   MCH 29.7 27.0 - 33.0 pg   MCHC 33.3 32.0 - 36.0 g/dL   RDW 13.6 11.0 - 15.0 %   Platelets 266 140 - 400 K/uL   MPV 10.3 7.5 - 12.5 fL   Neutro Abs 3,422 1,500 - 7,800 cells/uL   Lymphs Abs 1,566 850 - 3,900 cells/uL   Monocytes  Absolute 754 200 - 950 cells/uL   Eosinophils Absolute 58 15 - 500 cells/uL   Basophils Absolute 0 0 - 200 cells/uL   Neutrophils Relative % 59 %   Lymphocytes Relative 27 %   Monocytes Relative 13 %   Eosinophils Relative 1 %   Basophils Relative 0 %   Smear Review Criteria for review not met   TSH  Result Value Ref Range   TSH 0.05 (L) mIU/L  HgB A1c  Result Value Ref Range   Hgb A1c MFr Bld 5.9 (H) <5.7 %   Mean Plasma Glucose 123 mg/dL      Assessment & Plan:   Problem List Items Addressed This Visit    Impacted cerumen of both ears - Primary    Significant amount of large thick impacted cerumen bilaterally, suspected primary cause of current reduced bilateral hearing and symptoms. Consider presbycusis in 23 yr patient, also some cognitive difficulty with alzheimer's dementia could be a factor but seems less likely, especially given history of more recent change over few weeks not gradual loss. No prior hearing aids or hearing evaluation.  Plan: 1. Attempted office ear lavage cerumen removal without much success, noticeable softening and partial removal, but still deeper cerumen impaction. 2. Trial on OTC Debrox drops removal kit and flushing at home 3. Notify office if not improving 2-4 weeks, may return for re-evaluation or as discussed can proceed with ENT referral for formal cerumen removal and consider audiology hearing evaluation      Hearing reduced, bilateral    Suspected secondary to thick impacted cerumen bilaterally. See A&P         No orders of the defined types were placed in this encounter.     Follow up plan: Return in about 4 weeks (around 04/10/2016), or if symptoms worsen or fail to improve, for ear wax, hearing loss.  Megan Putnam, DO [facility] 03/13/2016, 10:43 AM

## 2016-03-13 NOTE — Assessment & Plan Note (Signed)
Suspected secondary to thick impacted cerumen bilaterally. See A&P

## 2016-03-27 DIAGNOSIS — H6123 Impacted cerumen, bilateral: Secondary | ICD-10-CM | POA: Diagnosis not present

## 2016-03-27 DIAGNOSIS — H903 Sensorineural hearing loss, bilateral: Secondary | ICD-10-CM | POA: Diagnosis not present

## 2016-05-20 ENCOUNTER — Other Ambulatory Visit: Payer: Self-pay | Admitting: Family Medicine

## 2016-05-20 DIAGNOSIS — I10 Essential (primary) hypertension: Secondary | ICD-10-CM

## 2016-05-28 ENCOUNTER — Ambulatory Visit: Payer: Medicare Other | Admitting: Family Medicine

## 2016-06-04 ENCOUNTER — Ambulatory Visit: Payer: Medicare Other | Admitting: Family Medicine

## 2016-07-01 ENCOUNTER — Ambulatory Visit: Payer: Medicare Other | Admitting: Family Medicine

## 2016-07-02 ENCOUNTER — Ambulatory Visit (INDEPENDENT_AMBULATORY_CARE_PROVIDER_SITE_OTHER): Payer: Medicare Other | Admitting: Family Medicine

## 2016-07-02 ENCOUNTER — Encounter: Payer: Self-pay | Admitting: Family Medicine

## 2016-07-02 VITALS — BP 123/72 | HR 89 | Temp 97.7°F | Resp 16 | Ht 60.0 in | Wt 94.6 lb

## 2016-07-02 DIAGNOSIS — R7303 Prediabetes: Secondary | ICD-10-CM

## 2016-07-02 DIAGNOSIS — E038 Other specified hypothyroidism: Secondary | ICD-10-CM | POA: Diagnosis not present

## 2016-07-02 DIAGNOSIS — E440 Moderate protein-calorie malnutrition: Secondary | ICD-10-CM | POA: Diagnosis not present

## 2016-07-02 DIAGNOSIS — F0281 Dementia in other diseases classified elsewhere with behavioral disturbance: Secondary | ICD-10-CM | POA: Diagnosis not present

## 2016-07-02 DIAGNOSIS — I10 Essential (primary) hypertension: Secondary | ICD-10-CM

## 2016-07-02 DIAGNOSIS — G301 Alzheimer's disease with late onset: Secondary | ICD-10-CM

## 2016-07-02 DIAGNOSIS — K219 Gastro-esophageal reflux disease without esophagitis: Secondary | ICD-10-CM

## 2016-07-02 DIAGNOSIS — F02818 Dementia in other diseases classified elsewhere, unspecified severity, with other behavioral disturbance: Secondary | ICD-10-CM

## 2016-07-02 DIAGNOSIS — E063 Autoimmune thyroiditis: Secondary | ICD-10-CM | POA: Diagnosis not present

## 2016-07-02 DIAGNOSIS — E43 Unspecified severe protein-calorie malnutrition: Secondary | ICD-10-CM | POA: Insufficient documentation

## 2016-07-02 LAB — POCT GLYCOSYLATED HEMOGLOBIN (HGB A1C): HEMOGLOBIN A1C: 6.2

## 2016-07-02 MED ORDER — OMEPRAZOLE 20 MG PO CPDR: 20.0000 mg | DELAYED_RELEASE_CAPSULE | Freq: Every day | ORAL | 3 refills | Status: DC

## 2016-07-02 NOTE — Assessment & Plan Note (Signed)
Last TSH 02/2016 with low abnormal, Levothyroxine down to 100mcg, did not follow-up with TSH re-check. Clinically doing well on current dose  Plan: 1. Continue Levothryoxine 100mcg daily 2. Check future TSH, Free T4 with all other labs 6 months

## 2016-07-02 NOTE — Assessment & Plan Note (Signed)
Stable Refilled Omeprazole for PRN flare use only, to reduce symptoms of heartburn or hoarseness if worsening again

## 2016-07-02 NOTE — Patient Instructions (Signed)
Thank you for coming in to clinic today.  1. Pre-Diabetes is controlled - A1c stable today 6.2, previously 5.9 over past 2 years - No need to change diet and lifestyle, continue regular diet - No new medications - We can start checking this A1c every 6 months for now  2. Blood pressure is well controlled - For BP and kidney protection, recommend to continue half tab Losartan 25mg  - try to use a pill cutter for this one  3. For stomach acid, refilled the Omeprazole only take as needed if flare up, can take for 2-4 weeks every day 30 min before 1st meal of day  4. For dementia  [facility] (require skilled medical need for nursing / PT, "homebound") [address] [place], [place] [address] Ph [phone] [email or web address]  Services Skilled RN, PT, OT, [employer] Aide, CSW, SLP  [facility] Zerita BoersHeather McKay (OT, Dementia Specialist) [address] [place], [place] [address] Ph [phone] [email or web address] - Navigate home and facility services for patients, caregiver support & resources  ---------------------- [facility] Address: [address], [place], [place] [address] Phone: [phone] ------------------------  HOME HEALTH  [facility] Health Care) [address] [place], [place] [address] [email or web address]  Lenn CalChristine Ray [phone] office  You will be due for FASTING BLOOD WORK (no food or drink after midnight before, only water or coffee without cream/sugar on the morning of)  - Please go ahead and schedule a "Lab Only" visit in the morning at the clinic for lab draw in 6 months before next Annual Physical - Make sure Lab Only appointment is at least 1-2 weeks before your next appointment, so that results will be available  For Lab Results, once available within 2-3 days of blood draw, you can can log in to MyChart online to view your results and a brief explanation. Also, we can discuss  results at next follow-up visit.   Please schedule a follow-up appointment with Dr. Althea CharonKaramalegos in 6 months for Annual Physical / Dementia / Pre-DM / HTN  If you have any other questions or concerns, please feel free to call the clinic or send a message through MyChart. You may also schedule an earlier appointment if necessary.  Saralyn PilarAlexander Altheia Shafran, DO [facility]

## 2016-07-02 NOTE — Assessment & Plan Note (Signed)
Stable Pre-DM, today A1c 6.2, recently has been 5.9 to 6.2 in past 2 yr No current management, no medications, and no diet/lifestyle changes, eats regular diet  Plan: 1. Reassurance, continue without any aggressive intervention, advised given age 83 and dementia, even if was dx with Type 2 DM, goal for A1c would be < 8.0, now advised more important to maintain appropriate nutrition, PO intake, and do not limit carbs - see A&P 2. Space out A1c q 6 months 3. Follow-up 6 months Annual Physical, A1c, labs

## 2016-07-02 NOTE — Progress Notes (Signed)
Subjective:    Patient ID: Megan Carpenter, female    DOB: September 24, 1928, 83 y.o.   MRN: [id number]  Megan Carpenter is a [age] y.o. female presenting on 07/02/2016 for Hypertension  Daughter Megan BoerVicki [facility]) provided history primarily for patient, patient provides some history as well, limited by dementia.  HPI   Pre-Diabetes: No concerns today. Prior A1c stable 5.9 to 6.2 CBGs: Does not check CBGs Meds: None Currently on ARB Lifestyle: Regular diet, does not follow DM diet, see below, goals to maintain and gain wt given poor nutritional status Denies hypoglycemia, polyuria, visual changes, numbness or tingling.  CHRONIC HTN: Reports no concerns. Does not check BP regularly at home. Current Meds - Losartan 25mg  (half tab for dose 12.5mg  daily) Reports good compliance, took med today. Tolerating well, w/o complaints. Denies CP, dyspnea, HA, edema, dizziness / lightheadedness  GERD, Hoarseness - Prior history of GERD in past had done well with PPI PRN only. Not taking chronic antacid. Also reports history of previous hoarseness voice, saw [facility]. - Currently of PPI, without admitting to symptoms of heartburn - Denies abdominal pain, heartburn, nausea, vomiting  DEMENTIA, with behavior disturbances / Protein Calorie Malnutrition - Chronic dementia, Alzheimer's type, gradual worsening, primarily managed by daughter as caregiver, other assistance is limited. In [facility], now she is limited coverage, mostly self pay, has hired other help at home for cleaning and other things but limited. In past had contacted [facility] and [facility] for [facility]. - Now has had Meals on Wheels, just recently started after on waiting list for a while, [facility], seems to be a very positive change - Taking ensure supplement once daily - Admits some behavioral changes occasionally, some wandering no significant agitation or violent  behavior, not considered harm to self or others - Admits reduced appetite and hydration, often can forget meals or not drink water regularly - Denies significant mood changes, depression  Hypothyroidism Continues Levothyroxine 100mcg daily, adjusted 02/2016 with low TSH.  Social History  Substance Use Topics  . Smoking status: Never Smoker  . Smokeless tobacco: Never Used  . Alcohol use No    Review of Systems Per HPI unless specifically indicated above     Objective:    BP 123/72   Pulse 89   Temp 97.7 F (36.5 C) (Oral)   Resp 16   Ht 5' (1.524 m)   Wt 94 lb 9.6 oz (42.9 kg)   BMI 18.48 kg/m   Wt Readings from Last 3 Encounters:  07/02/16 94 lb 9.6 oz (42.9 kg)  03/13/16 94 lb (42.6 kg)  02/26/16 93 lb (42.2 kg)    Physical Exam  Constitutional: She appears well-developed and well-nourished. No distress.  Pleasant 83 year female, chronically ill but currently well appearing, comfortable, cooperative  HENT:  Head: Normocephalic and atraumatic.  Frontal / maxillary sinuses non-tender. Nares patent without purulence or edema. Bilateral TMs clear without erythema, effusion or bulging, resolved cerumen impaction since last visit. Oropharynx clear without erythema, exudates, edema or asymmetry.  Mild oral mucosal tongue dryness, poor dentition  Cardiovascular: Normal rate.   Pulmonary/Chest: Effort normal.  Neurological: She is alert.  Skin: Skin is warm and dry. No rash noted. She is not diaphoretic. No erythema.  Psychiatric: Her behavior is normal.  Nursing note and vitals reviewed.   Nutrition Focused Physical Exam:  Subcutaneous Fat:  Orbital Region: Reduced Upper Arm Region:  Reduced Thoracic and Lumbar Region: Reduced  Muscle:  Temple Region: Mild to moderate atrophy Anterior Thigh Region: Mild to moderate atrophy  Edema: None  Results for orders placed or performed in visit on 07/02/16  POCT HgB A1C  Result Value Ref Range   Hemoglobin A1C 6.2        Assessment & Plan:   Problem List Items Addressed This Visit    Protein-calorie malnutrition, moderate (HCC)    Clinically consistent with protein calorie malnutrition based on some reduced subQ fatty tissue and muscle atrophy, BMI >18, no other systemic symptoms, in setting of dementia, reduced appetite.  Plan: 1. Continue ensure daily, may increase to twice daily if needed 2. Continue meals on wheels, caregiver support for regular meal intake 3. Follow-up 6 months for annual physical, future labs, consider meds such as Mirtazapine to stimulate appetite if needed      Relevant Orders   Lipid panel   Prediabetes - Primary    Stable Pre-DM, today A1c 6.2, recently has been 5.9 to 6.2 in past 2 yr No current management, no medications, and no diet/lifestyle changes, eats regular diet  Plan: 1. Reassurance, continue without any aggressive intervention, advised given age 83 and dementia, even if was dx with Type 2 DM, goal for A1c would be < 8.0, now advised more important to maintain appropriate nutrition, PO intake, and do not limit carbs - see A&P 2. Space out A1c q 6 months 3. Follow-up 6 months Annual Physical, A1c, labs      Relevant Orders   POCT HgB A1C (Completed)   Hemoglobin A1c   Hypothyroidism    Last TSH 02/2016 with low abnormal, Levothyroxine down to , did not follow-up with TSH re-check. Clinically doing well on current dose  Plan: 1. Continue Levothryoxine daily 2. Check future TSH, Free T4 with all other labs 6 months      Relevant Orders   TSH   T4, free   Hypertension    Stable, well controlled Continue Losartan half 25mg  tab (12.5mg  dose) daily - advised to obtain pill cutter for easier use Follow-up 6 months, future labs      Relevant Orders   COMPLETE METABOLIC PANEL WITH GFR   Lipid panel   GERD (gastroesophageal reflux disease)    Stable Refilled Omeprazole for PRN flare use only, to reduce symptoms of heartburn or hoarseness if  worsening again      Relevant Medications   omeprazole (PRILOSEC) 20 MG capsule   Dementia    Currently stable, with history of progressive Alzheimer's dementia, clinically FAST Stage 6 (needs help with ADLs), some history of behavioral disturbance without evidence of harm or danger to self/others. - Increased caregiver burden, limited financial and availability, previous on HH, and other resources - Meds: prior on Aricept, now off  Plan: 1. Reviewed Dementia prognosis and management, explained that may need additional support in future. Provided resources and contact info for [facility] [place] [facility], [facility] from hospice, and [facility] as initial options (in past had used [facility] for other resources), advised daughter (primary caregiver) may need to consider ALF vs more advanced care if needed      Relevant Orders   COMPLETE METABOLIC PANEL WITH GFR      Meds ordered this encounter  Medications  . omeprazole (PRILOSEC) 20 MG capsule    Sig: Take 1 capsule (20 mg total) by mouth daily before breakfast. Take for 2-4 weeks at  a time, only if needed for worsening acid reflux    Dispense:  30 capsule    Refill:  3      Follow up plan: Return in about 6 months (around 01/02/2017) for Annual Physical.  Saralyn Pilar, DO [facility] 07/02/2016, 10:06 PM

## 2016-07-02 NOTE — Assessment & Plan Note (Signed)
Clinically consistent with protein calorie malnutrition based on some reduced subQ fatty tissue and muscle atrophy, BMI >18, no other systemic symptoms, in setting of dementia, reduced appetite.  Plan: 1. Continue ensure daily, may increase to twice daily if needed 2. Continue meals on wheels, caregiver support for regular meal intake 3. Follow-up 6 months for annual physical, future labs, consider meds such as Mirtazapine to stimulate appetite if needed

## 2016-07-02 NOTE — Assessment & Plan Note (Signed)
Stable, well controlled Continue Losartan half 25mg  tab (12.5mg  dose) daily - advised to obtain pill cutter for easier use Follow-up 6 months, future labs

## 2016-07-02 NOTE — Assessment & Plan Note (Signed)
Currently stable, with history of progressive Alzheimer's dementia, clinically FAST Stage 6 (needs help with ADLs), some history of behavioral disturbance without evidence of harm or danger to self/others. - Increased caregiver burden, limited financial and availability, previous on HH, and other resources - Meds: prior on Aricept, now off  Plan: 1. Reviewed Dementia prognosis and management, explained that may need additional support in future. Provided resources and contact info for [facility] [place] [facility], [facility] from hosp[facility], and [facility] as initial options ([facility]), advised daughter (primary caregiver) may need to consider ALF vs more advanced care if needed

## 2016-12-31 ENCOUNTER — Other Ambulatory Visit: Payer: Self-pay | Admitting: Family Medicine

## 2017-01-02 ENCOUNTER — Other Ambulatory Visit: Payer: Self-pay | Admitting: Family Medicine

## 2017-01-02 DIAGNOSIS — K219 Gastro-esophageal reflux disease without esophagitis: Secondary | ICD-10-CM

## 2017-04-02 ENCOUNTER — Other Ambulatory Visit: Payer: Self-pay

## 2017-04-02 MED ORDER — LEVOTHYROXINE SODIUM 100 MCG PO TABS: ORAL_TABLET | ORAL | 0 refills | Status: DC

## 2017-05-09 ENCOUNTER — Encounter: Payer: Medicare Other | Attending: Physician Assistant | Admitting: Physician Assistant

## 2017-05-09 DIAGNOSIS — I10 Essential (primary) hypertension: Secondary | ICD-10-CM | POA: Insufficient documentation

## 2017-05-09 DIAGNOSIS — R7303 Prediabetes: Secondary | ICD-10-CM | POA: Diagnosis not present

## 2017-05-09 DIAGNOSIS — S91301A Unspecified open wound, right foot, initial encounter: Secondary | ICD-10-CM | POA: Insufficient documentation

## 2017-05-09 DIAGNOSIS — Z880 Allergy status to penicillin: Secondary | ICD-10-CM | POA: Diagnosis not present

## 2017-05-09 DIAGNOSIS — X58XXXA Exposure to other specified factors, initial encounter: Secondary | ICD-10-CM | POA: Diagnosis not present

## 2017-05-09 DIAGNOSIS — L97512 Non-pressure chronic ulcer of other part of right foot with fat layer exposed: Secondary | ICD-10-CM | POA: Diagnosis present

## 2017-05-09 DIAGNOSIS — F0390 Unspecified dementia without behavioral disturbance: Secondary | ICD-10-CM | POA: Diagnosis not present

## 2017-05-09 DIAGNOSIS — E440 Moderate protein-calorie malnutrition: Secondary | ICD-10-CM | POA: Insufficient documentation

## 2017-05-11 NOTE — Progress Notes (Addendum)
[name], [name] ([id number]) Visit Report for 05/09/2017 Chief Complaint Document Details Patient Name: Megan Carpenter, Megan Carpenter. Date of Service: 05/09/2017 8:00 AM Medical Record Number: [id number] Patient Account Number: [id number] Date of Birth/Sex: Dec 06, 1928 (84 y.o. Female) Treating RN: Huel Coventry Primary Care Provider: Jonna Clark, Fayrene Fearing Other Clinician: Referring Provider: Jonna Clark, Fayrene Fearing Treating Provider/Extender: Linwood Dibbles, HOYT Weeks in Treatment: 0 Information Obtained from: Patient Electronic Signature(s) Signed: 05/09/2017 4:49:03 PM By: Lenda Kelp PA-C Entered By: Lenda Kelp on 05/09/2017 09:26:18 Megan Carpenter ([id number]) -------------------------------------------------------------------------------- Debridement Details Patient Name: Megan Carpenter Date of Service: 05/09/2017 8:00 AM Medical Record Number: [id number] Patient Account Number: [id number] Date of Birth/Sex: [birth year] (84 y.o. Female) Treating RN: Huel Coventry Primary Care Provider: Jonna Clark, Fayrene Fearing Other Clinician: Referring Provider: Jonna Clark, Fayrene Fearing Treating Provider/Extender: Linwood Dibbles, HOYT Weeks in Treatment: 0 Debridement Performed for Wound #1 Right,Lateral Ankle Assessment: Performed By: Physician STONE III, HOYT E., PA-C Debridement: Debridement Pre-procedure Verification/Time Yes - 09:13 Out Taken: Start Time: 09:14 Pain Control: Lidocaine 4% Topical Solution Level: Skin/Subcutaneous Tissue Total Area Debrided (L x W): 0.5 (cm) x 0.7 (cm) = 0.35 (cm) Tissue and other material Viable, Non-Viable, Exudate, Fibrin/Slough, Subcutaneous debrided: Instrument: Curette Bleeding: Minimum Hemostasis Achieved: Pressure End Time: 09:18 Procedural Pain: 0 Post Procedural Pain: 0 Response to Treatment: Procedure was tolerated well Post Debridement Measurements of Total Wound Length: (cm) 0.5 Width: (cm) 0.7 Depth: (cm) 0.5 Volume: (cm) 0.137 Character of Wound/Ulcer Post  Debridement: Requires Further Debridement Post Procedure Diagnosis Same as Pre-procedure Electronic Signature(s) Signed: 05/09/2017 4:49:03 PM By: Lenda Kelp PA-C Signed: 05/09/2017 5:31:47 PM By: Elliot Gurney, BSN, RN, CWS, Kim RN, BSN Entered By: Elliot Gurney, BSN, RN, CWS, Kim on 05/09/2017 09:18:45 Megan Carpenter ([id number]) -------------------------------------------------------------------------------- HPI Details Patient Name: Megan Carpenter Date of Service: 05/09/2017 8:00 AM Medical Record Number: [id number] Patient Account Number: [id number] Date of Birth/Sex: 12/16/1928 (84 y.o. Female) Treating RN: Huel Coventry Primary Care Provider: Jonna Clark, Fayrene Fearing Other Clinician: Referring Provider: Jonna Clark, Fayrene Fearing Treating Provider/Extender: Linwood Dibbles, HOYT Weeks in Treatment: 0 History of Present Illness Associated Signs and Symptoms: Patient has a history of dementia, moderate protein calorie malnutrition, hypertension, and prediabetes. HPI Description: 05/09/17 on evaluation today patient appears to be doing decently well in regard to her right ankle wound although we are seeing her for initial evaluation. I did see the photograph that her daughter had and showed me today which seems to indicate that the wound was definitely much worse and her foot much more swollen prior to the initiation of antibiotics when she was seen at [facility] on [facility]. Subsequently she was placed on Bactrim DS along with Keflex. They did x-ray her foot and there did not appear to be any fracture or abnormality based on the x-ray noted in the chart which I did review. The x-ray the right ankle should osteoarthritis but no acute fracture. Initially they diagnosed her with a sprain of the ankle but then subsequently were more concerned about the possibility of cellulitis of her foot it was then recommended that she follow up with wound care which is where she comes into me today. This injury occurred last Thursday  which was on 05/01/17. There are unsure of exactly what might have precipitated this event although she does have a history of having scratches from her notable cats that she has in her home her daughter believes this might have been the factor involved. No fevers, chills, nausea, or vomiting noted at  this time. Electronic Signature(s) Signed: 06/03/2017 10:42:09 AM By: Lenda Kelp PA-C Previous Signature: 05/09/2017 4:49:03 PM Version By: Lenda Kelp PA-C Entered By: Lenda Kelp on 06/03/2017 09:34:48 Megan Carpenter ([id number]) -------------------------------------------------------------------------------- Physical Exam Details Patient Name: Megan Carpenter Date of Service: 05/09/2017 8:00 AM Medical Record Number: [id number] Patient Account Number: [id number] Date of Birth/Sex: [birth year] (84 y.o. Female) Treating RN: Huel Coventry Primary Care Provider: Jonna Clark, Fayrene Fearing Other Clinician: Referring Provider: Jonna Clark, Fayrene Fearing Treating Provider/Extender: Linwood Dibbles, HOYT Weeks in Treatment: 0 Constitutional patient is hypertensive.. respirations regular, non-labored and within target range for patient.Marland Kitchen temperature within target range for patient.. Well-nourished and well-hydrated in no acute distress. Eyes conjunctiva clear no eyelid edema noted. pupils equal round and reactive to light and accommodation. Ears, Nose, Mouth, and Throat no gross abnormality of ear auricles or external auditory canals. normal hearing noted during conversation. mucus membranes moist. Respiratory normal breathing without difficulty. clear to auscultation bilaterally. Cardiovascular regular rate and rhythm with normal S1, S2. Faint posterior tibial and dorsalis pedis pulses bilateral lower extremities. no clubbing, cyanosis, significant edema, <3 sec cap refill. Gastrointestinal (GI) soft, non-tender, non-distended, +BS. no ventral hernia noted. Musculoskeletal unsteady while  walking. Psychiatric Patient is not able to cooperate in decision making regarding care. Patient is oriented to person only. pleasant and cooperative. Notes Patient's wound did have some Slough noted centrally and this was sharply debride it today after attaining consent. Patient tolerated the debridement without complication she did have mild discomfort but only in the deeper portions. The wound did have much more undermining than was originally noted post debridement. The good news is it did seem to clean out very well. Electronic Signature(s) Signed: 05/09/2017 4:49:03 PM By: Lenda Kelp PA-C Entered By: Lenda Kelp on 05/09/2017 16:46:38 Megan Carpenter ([id number]) -------------------------------------------------------------------------------- Physician Orders Details Patient Name: Megan Carpenter Date of Service: 05/09/2017 8:00 AM Medical Record Number: [id number] Patient Account Number: [id number] Date of Birth/Sex: Jul 22, 1928 (84 y.o. Female) Treating RN: Huel Coventry Primary Care Provider: Jonna Clark, Fayrene Fearing Other Clinician: Referring Provider: Jonna Clark, Fayrene Fearing Treating Provider/Extender: Linwood Dibbles, HOYT Weeks in Treatment: 0 Verbal / Phone Orders: Yes Clinician: Huel Coventry Read Back and Verified: Yes Diagnosis Coding ICD-10 Coding Code Description S91.301A Unspecified open wound, right foot, initial encounter L97.512 Non-pressure chronic ulcer of other part of right foot with fat layer exposed F03.90 Unspecified dementia without behavioral disturbance E44.0 Moderate protein-calorie malnutrition I10 Essential (primary) hypertension R73.03 Prediabetes Wound Cleansing Wound #1 Right,Lateral Ankle o Clean wound with Normal Saline. o Cleanse wound with mild soap and water o May Shower, gently pat wound dry prior to applying new dressing. Anesthetic (add to Medication List) Wound #1 Right,Lateral Ankle o Topical Lidocaine 4% cream applied to wound bed  prior to debridement (In Clinic Only). Skin Barriers/Peri-Wound Care Wound #1 Right,Lateral Ankle o Skin Prep Primary Wound Dressing Wound #1 Right,Lateral Ankle o Silvercel Non-Adherent - rope pack lightly in wound Secondary Dressing Wound #1 Right,Lateral Ankle o Boardered Foam Dressing Dressing Change Frequency Wound #1 Right,Lateral Ankle o Change dressing every other day. Follow-up Appointments Wound #1 Right,Lateral Ankle o Return Appointment in 1 week. Additional Orders / Instructions KEISY, STRICKLER ([id number]) Wound #1 Right,Lateral Ankle o Vitamin A; Vitamin C, Zinc o Increase protein intake. Home Health Wound #1 Right,Lateral Ankle o Initiate Home Health for Skilled Nursing o Home Health Nurse may visit PRN to address patientos wound care needs. o FACE TO FACE ENCOUNTER: MEDICARE and  MEDICAID PATIENTS: I certify that this patient is under my care and that I had a face-to-face encounter that meets the physician face-to-face encounter requirements with this patient on this date. The encounter with the patient was in whole or in part for the following MEDICAL CONDITION: (primary reason for Home Healthcare) MEDICAL NECESSITY: I certify, that based on my findings, NURSING services are a medically necessary home health service. HOME BOUND STATUS: I certify that my clinical findings support that this patient is homebound (i.e., Due to illness or injury, pt requires aid of supportive devices such as crutches, cane, wheelchairs, walkers, the use of special transportation or the assistance of another person to leave their place of residence. There is a normal inability to leave the home and doing so requires considerable and taxing effort. Other absences are for medical reasons / religious services and are infrequent or of short duration when for other reasons). o If current dressing causes regression in wound condition, may D/C ordered dressing product/s  and apply Normal Saline Moist Dressing daily until next Wound Healing Center / Other MD appointment. Notify Wound Healing Center of regression in wound condition at [phone]. o Please direct any NON-WOUND related issues/requests for orders to patient's Primary Care Physician Patient Medications Allergies: penicillin Notifications Medication Indication Start End lidocaine DOSE 1 - topical 4 % cream - 1 cream topical Electronic Signature(s) Signed: 05/09/2017 4:49:03 PM By: Lenda Kelp PA-C Signed: 05/09/2017 5:25:50 PM By: Alejandro Mulling Entered By: Alejandro Mulling on 05/09/2017 15:12:43 Megan Carpenter ([id number]) -------------------------------------------------------------------------------- Prescription 05/09/2017 Patient Name: Megan Carpenter Provider: Lenda Kelp PA-C Date of Birth: [birth year] NPI#: [id number] Sex: F DEA#: [id number] Phone #: [phone] License #: Patient Address: [facility] [address] [facility] [place], [place] [address] [address], [address] [place], [place] [address] [phone] Allergies penicillin Reaction: rash, swelling Medication Medication: Route: Strength: Form: lidocaine 4 % topical cream topical 4% cream Class: TOPICAL LOCAL ANESTHETICS Dose: Frequency / Time: Indication: 1 1 cream topical Number of Refills: Number of Units: 0 Generic Substitution: Start Date: End Date: One Time Use: Substitution Permitted No Note to Pharmacy: Signature(s): Date(s): Electronic Signature(s) Signed: 05/09/2017 4:49:03 PM By: Lenda Kelp PA-C Signed: 05/09/2017 5:25:50 PM By: Alejandro Mulling Entered By: Alejandro Mulling on 05/09/2017 15:12:44 Megan Carpenter ([id number]) --------------------------------------------------------------------------------  Problem List Details Patient Name: Megan Carpenter Date of Service: 05/09/2017 8:00 AM Medical Record Number:  [id number] Patient Account Number: [id number] Date of Birth/Sex: 1928/08/02 (84 y.o. Female) Treating RN: Huel Coventry Primary Care Provider: Jonna Clark, Fayrene Fearing Other Clinician: Referring Provider: Jonna Clark, Fayrene Fearing Treating Provider/Extender: Linwood Dibbles, HOYT Weeks in Treatment: 0 Active Problems ICD-10 Encounter Code Description Active Date Diagnosis S91.001A Unspecified open wound, right ankle, initial encounter 06/03/2017 Yes L97.312 Non-pressure chronic ulcer of right ankle with fat layer exposed 06/03/2017 Yes F03.90 Unspecified dementia without behavioral disturbance 05/09/2017 Yes E44.0 Moderate protein-calorie malnutrition 05/09/2017 Yes I10 Essential (primary) hypertension 05/09/2017 Yes R73.03 Prediabetes 05/09/2017 Yes Inactive Problems Resolved Problems Electronic Signature(s) Signed: 06/03/2017 10:42:09 AM By: Lenda Kelp PA-C Previous Signature: 05/09/2017 4:49:03 PM Version By: Lenda Kelp PA-C Entered By: Lenda Kelp on 06/03/2017 09:34:32 Gramlich, Sharyn Lull ([id number]) -------------------------------------------------------------------------------- Progress Note Details Patient Name: Megan Carpenter Date of Service: 05/09/2017 8:00 AM Medical Record Number: [id number] Patient Account Number: [id number] Date of Birth/Sex: 01-Jan-1929 (84 y.o. Female) Treating RN: Huel Coventry Primary Care Provider: Jonna Clark, Fayrene Fearing Other Clinician: Referring Provider: Juanetta Gosling  Freida Busman Treating Provider/Extender: Linwood Dibbles, HOYT Weeks in Treatment: 0 Subjective Chief Complaint Information obtained from Patient History of Present Illness (HPI) The following HPI elements were documented for the patient's wound: Associated Signs and Symptoms: Patient has a history of dementia, moderate protein calorie malnutrition, hypertension, and prediabetes. 05/09/17 on evaluation today patient appears to be doing decently well in regard to her right ankle wound although we are seeing her for  initial evaluation. I did see the photograph that her daughter had and showed me today which seems to indicate that the wound was definitely much worse and her foot much more swollen prior to the initiation of antibiotics when she was seen at [facility] on 05/01/17. Subsequently she was placed on Bactrim DS along with Keflex. They did x-ray her foot and there did not appear to be any fracture or abnormality based on the x-ray noted in the chart which I did review. The x-ray the right ankle should osteoarthritis but no acute fracture. Initially they diagnosed her with a sprain of the ankle but then subsequently were more concerned about the possibility of cellulitis of her foot it was then recommended that she follow up with wound care which is where she comes into me today. This injury occurred last Thursday which was on 05/01/17. There are unsure of exactly what might have precipitated this event although she does have a history of having scratches from her notable cats that she has in her home her daughter believes this might have been the factor involved. No fevers, chills, nausea, or vomiting noted at this time. Wound History Patient presents with 1 open wound that has been present for approximately 2 weeks. Patient has been treating wound in the following manner: bandaid. Laboratory tests have not been performed in the last month. Patient reportedly has not tested positive for an antibiotic resistant organism. Patient reportedly has not tested positive for osteomyelitis. Patient reportedly has not had testing performed to evaluate circulation in the legs. Patient experiences the following problems associated with their wounds: infection. Patient History Information obtained from Patient, Caregiver. Allergies penicillin (Reaction: rash, swelling) Family History Cancer - Father, Heart Disease - Paternal Grandparents, Lung Disease - Father, No family history of Diabetes, Hypertension, Kidney  Disease, Seizures, Stroke, Thyroid Problems, Tuberculosis. Social History Never smoker, Marital Status - Widowed, Alcohol Use - Never, Drug Use - No History, Caffeine Use - Daily. Medical History Eyes Patient has history of Cataracts - surgery bilateral Denies history of Glaucoma, Optic Neuritis Ear/Nose/Mouth/Throat Denies history of Chronic sinus problems/congestion, Middle ear problems MAURICA, OMURA ([id number]) Hematologic/Lymphatic Denies history of Anemia, Hemophilia, Human Immunodeficiency Virus, Lymphedema, Sickle Cell Disease Respiratory Denies history of Aspiration, Asthma, Chronic Obstructive Pulmonary Disease (COPD), Pneumothorax, Sleep Apnea, Tuberculosis Cardiovascular Patient has history of Hypertension Denies history of Angina, Arrhythmia, Congestive Heart Failure, Coronary Artery Disease, Deep Vein Thrombosis, Hypotension, Myocardial Infarction, Peripheral Arterial Disease, Peripheral Venous Disease, Phlebitis, Vasculitis Gastrointestinal Denies history of Cirrhosis , Colitis, Crohn s, Hepatitis A, Hepatitis B, Hepatitis C Endocrine Denies history of Type I Diabetes, Type II Diabetes Genitourinary Denies history of End Stage Renal Disease Immunological Denies history of Lupus Erythematosus, Raynaud s, Scleroderma Integumentary (Skin) Denies history of History of Burn, History of pressure wounds Musculoskeletal Denies history of Gout, Rheumatoid Arthritis, Osteoarthritis, Osteomyelitis Neurologic Patient has history of Dementia Denies history of Neuropathy, Quadriplegia, Paraplegia, Seizure Disorder Oncologic Denies history of Received Chemotherapy, Received Radiation Psychiatric Denies history of Anorexia/bulimia, Confinement Anxiety Review of Systems (ROS) Constitutional Symptoms (  General Health) The patient has no complaints or symptoms. Eyes The patient has no complaints or symptoms. Ear/Nose/Mouth/Throat The patient has no complaints or  symptoms. Hematologic/Lymphatic The patient has no complaints or symptoms. Respiratory The patient has no complaints or symptoms. Cardiovascular The patient has no complaints or symptoms. Gastrointestinal The patient has no complaints or symptoms. Endocrine Complains or has symptoms of Thyroid disease. Denies complaints or symptoms of Hepatitis, Polydypsia (Excessive Thirst). Genitourinary The patient has no complaints or symptoms. Immunological The patient has no complaints or symptoms. Integumentary (Skin) Complains or has symptoms of Wounds. Denies complaints or symptoms of Bleeding or bruising tendency, Breakdown, Swelling. Musculoskeletal The patient has no complaints or symptoms. Neurologic The patient has no complaints or symptoms. Oncologic The patient has no complaints or symptoms. Megan RedderDOBY, Nobuko J. ([id number]) Psychiatric Complains or has symptoms of Anxiety. Objective Constitutional patient is hypertensive.. respirations regular, non-labored and within target range for patient.Marland Kitchen. temperature within target range for patient.. Well-nourished and well-hydrated in no acute distress. Vitals Time Taken: 8:33 AM, Height: 60 in, Source: Measured, Weight: 88.3 lbs, Source: Measured, BMI: 17.2, Temperature: 97.5 F, Pulse: 81 bpm, Respiratory Rate: 16 breaths/min, Blood Pressure: 144/68 mmHg. Eyes conjunctiva clear no eyelid edema noted. pupils equal round and reactive to light and accommodation. Ears, Nose, Mouth, and Throat no gross abnormality of ear auricles or external auditory canals. normal hearing noted during conversation. mucus membranes moist. Respiratory normal breathing without difficulty. clear to auscultation bilaterally. Cardiovascular regular rate and rhythm with normal S1, S2. Faint posterior tibial and dorsalis pedis pulses bilateral lower extremities. no clubbing, cyanosis, significant edema, Gastrointestinal (GI) soft, non-tender, non-distended, +BS.  no ventral hernia noted. Musculoskeletal unsteady while walking. Psychiatric Patient is not able to cooperate in decision making regarding care. Patient is oriented to person only. pleasant and cooperative. General Notes: Patient's wound did have some Slough noted centrally and this was sharply debride it today after attaining consent. Patient tolerated the debridement without complication she did have mild discomfort but only in the deeper portions. The wound did have much more undermining than was originally noted post debridement. The good news is it did seem to clean out very well. Integumentary (Hair, Skin) Wound #1 status is Open. Original cause of wound was Trauma. The wound is located on the Right,Lateral Ankle. The wound measures 0.5cm length x 0.7cm width x 0.1cm depth; 0.275cm^2 area and 0.027cm^3 volume. There is Fat Layer (Subcutaneous Tissue) Exposed exposed. There is no tunneling or undermining noted. There is a medium amount of serous drainage noted. The wound margin is flat and intact. There is no granulation within the wound bed. There is a large (67-100%) amount of necrotic tissue within the wound bed including Eschar and Adherent Slough. The periwound skin appearance exhibited: Rubor. The periwound skin appearance did not exhibit: Callus, Crepitus, Excoriation, Induration, Rash, Scarring, Dry/Scaly, Maceration, Atrophie Blanche, Cyanosis, Ecchymosis, Hemosiderin Staining, Mottled, Pallor, Erythema. Megan RedderDOBY, Tristen J. ([id number]) Assessment Active Problems ICD-10 S91.001A - Unspecified open wound, right ankle, initial encounter L97.312 - Non-pressure chronic ulcer of right ankle with fat layer exposed F03.90 - Unspecified dementia without behavioral disturbance E44.0 - Moderate protein-calorie malnutrition I10 - Essential (primary) hypertension R73.03 - Prediabetes Procedures Wound #1 Pre-procedure diagnosis of Wound #1 is a Trauma, Other located on the Right,Lateral  Ankle . There was a Skin/Subcutaneous Tissue Debridement ([id number]) debridement with total area of 0.35 sq cm performed by STONE III, HOYT E., PA-C. with the following instrument(s): Curette to remove Viable and Non-Viable tissue/material including  Exudate, Fibrin/Slough, and Subcutaneous after achieving pain control using Lidocaine 4% Topical Solution. A time out was conducted at 09:13, prior to the start of the procedure. A Minimum amount of bleeding was controlled with Pressure. The procedure was tolerated well with a pain level of 0 throughout and a pain level of 0 following the procedure. Post Debridement Measurements: 0.5cm length x 0.7cm width x 0.5cm depth; 0.137cm^3 volume. Character of Wound/Ulcer Post Debridement requires further debridement. Post procedure Diagnosis Wound #1: Same as Pre-Procedure Plan Wound Cleansing: Wound #1 Right,Lateral Ankle: Clean wound with Normal Saline. Cleanse wound with mild soap and water May Shower, gently pat wound dry prior to applying new dressing. Anesthetic (add to Medication List): Wound #1 Right,Lateral Ankle: Topical Lidocaine 4% cream applied to wound bed prior to debridement (In Clinic Only). Skin Barriers/Peri-Wound Care: Wound #1 Right,Lateral Ankle: Skin Prep Primary Wound Dressing: Wound #1 Right,Lateral Ankle: Silvercel Non-Adherent - rope pack lightly in wound Secondary Dressing: Wound #1 Right,Lateral Ankle: Boardered Foam Dressing Dressing Change Frequency: Wound #1 Right,Lateral Ankle: Thomley, Traniya J. ([id number]) Change dressing every other day. Follow-up Appointments: Wound #1 Right,Lateral Ankle: Return Appointment in 1 week. Additional Orders / Instructions: Wound #1 Right,Lateral Ankle: Vitamin A; Vitamin C, Zinc Increase protein intake. Home Health: Wound #1 Right,Lateral Ankle: Initiate Home Health for Skilled Nursing Home Health Nurse may visit PRN to address patient s wound care needs. FACE TO FACE  ENCOUNTER: MEDICARE and MEDICAID PATIENTS: I certify that this patient is under my care and that I had a face-to-face encounter that meets the physician face-to-face encounter requirements with this patient on this date. The encounter with the patient was in whole or in part for the following MEDICAL CONDITION: (primary reason for Home Healthcare) MEDICAL NECESSITY: I certify, that based on my findings, NURSING services are a medically necessary home health service. HOME BOUND STATUS: I certify that my clinical findings support that this patient is homebound (i.e., Due to illness or injury, pt requires aid of supportive devices such as crutches, cane, wheelchairs, walkers, the use of special transportation or the assistance of another person to leave their place of residence. There is a normal inability to leave the home and doing so requires considerable and taxing effort. Other absences are for medical reasons / religious services and are infrequent or of short duration when for other reasons). If current dressing causes regression in wound condition, may D/C ordered dressing product/s and apply Normal Saline Moist Dressing daily until next [facility] Healing Center / Other MD appointment. Notify W[facility]aling Center of regression in wound condition at [phone]. Please direct any NON-WOUND related issues/requests for orders to patient's Primary Care Physician The following medication(s) was prescribed: lidocaine topical 4 % cream 1 1 cream topical was prescribed at facility I am going to recommend currently that we initiate treatment with the silver alginate dressing which we packed into the undermining of the wound. We are going to see were things stand in one weeks time we see her for reevaluation in that regard. Obviously we do not need repeating the x-rays that she had wanted this did appear to be normal according to the document that I reviewed. This was from the orthopedic urgent care. We  will see how she is in one weeks time if anything worsens in the interim patient's daughter will contact our office for additional recommendations. If she is unable to get in touch with [place] because it is a weekend I would suggest they go to the ER  as soon as possible. Otherwise I do want her to complete the antibiotics that she has been prescribed as this seems to be helpful in improving the cellulitis. Electronic Signature(s) Signed: 06/03/2017 10:42:09 AM By: Lenda Kelp PA-C Previous Signature: 05/09/2017 4:49:03 PM Version By: Lenda Kelp PA-C Entered By: Lenda Kelp on 06/03/2017 09:35:07 Megan Carpenter ([id number]) -------------------------------------------------------------------------------- ROS/PFSH Details Patient Name: Megan Carpenter Date of Service: 05/09/2017 8:00 AM Medical Record Number: [id number] Patient Account Number: [id number] Date of Birth/Sex: [birth year] (84 y.o. Female) Treating RN: Huel Coventry Primary Care Provider: Jonna Clark, Fayrene Fearing Other Clinician: Referring Provider: Jonna Clark, Fayrene Fearing Treating Provider/Extender: Linwood Dibbles, HOYT Weeks in Treatment: 0 Information Obtained From Patient Caregiver Wound History Do you currently have one or more open woundso Yes How many open wounds do you currently haveo 1 Approximately how long have you had your woundso 2 weeks How have you been treating your wound(s) until nowo bandaid Has your wound(s) ever healed and then re-openedo No Have you had any lab work done in the past montho No Have you tested positive for an antibiotic resistant organism (MRSA, VRE)o No Have you tested positive for osteomyelitis (bone infection)o No Have you had any tests for circulation on your legso No Have you had other problems associated with your woundso Infection Endocrine Complaints and Symptoms: Positive for: Thyroid disease Negative for: Hepatitis; Polydypsia (Excessive Thirst) Medical History: Negative for: Type I  Diabetes; Type II Diabetes Integumentary (Skin) Complaints and Symptoms: Positive for: Wounds Negative for: Bleeding or bruising tendency; Breakdown; Swelling Medical History: Negative for: History of Burn; History of pressure wounds Psychiatric Complaints and Symptoms: Positive for: Anxiety Medical History: Negative for: Anorexia/bulimia; Confinement Anxiety Constitutional Symptoms (General Health) Complaints and Symptoms: No Complaints or Symptoms Eyes Complaints and Symptoms: No Complaints or Symptoms Buffin, Italia J. ([id number]) Medical History: Positive for: Cataracts - surgery bilateral Negative for: Glaucoma; Optic Neuritis Ear/Nose/Mouth/Throat Complaints and Symptoms: No Complaints or Symptoms Medical History: Negative for: Chronic sinus problems/congestion; Middle ear problems Hematologic/Lymphatic Complaints and Symptoms: No Complaints or Symptoms Medical History: Negative for: Anemia; Hemophilia; Human Immunodeficiency Virus; Lymphedema; Sickle Cell Disease Respiratory Complaints and Symptoms: No Complaints or Symptoms Medical History: Negative for: Aspiration; Asthma; Chronic Obstructive Pulmonary Disease (COPD); Pneumothorax; Sleep Apnea; Tuberculosis Cardiovascular Complaints and Symptoms: No Complaints or Symptoms Medical History: Positive for: Hypertension Negative for: Angina; Arrhythmia; Congestive Heart Failure; Coronary Artery Disease; Deep Vein Thrombosis; Hypotension; Myocardial Infarction; Peripheral Arterial Disease; Peripheral Venous Disease; Phlebitis; Vasculitis Gastrointestinal Complaints and Symptoms: No Complaints or Symptoms Medical History: Negative for: Cirrhosis ; Colitis; Crohnos; Hepatitis A; Hepatitis B; Hepatitis C Genitourinary Complaints and Symptoms: No Complaints or Symptoms Medical History: Negative for: End Stage Renal Disease Immunological Complaints and Symptoms: No Complaints or Symptoms Llamas, Karlee J.  ([id number]) Medical History: Negative for: Lupus Erythematosus; Raynaudos; Scleroderma Musculoskeletal Complaints and Symptoms: No Complaints or Symptoms Medical History: Negative for: Gout; Rheumatoid Arthritis; Osteoarthritis; Osteomyelitis Neurologic Complaints and Symptoms: No Complaints or Symptoms Medical History: Positive for: Dementia Negative for: Neuropathy; Quadriplegia; Paraplegia; Seizure Disorder Oncologic Complaints and Symptoms: No Complaints or Symptoms Medical History: Negative for: Received Chemotherapy; Received Radiation HBO Extended History Items Eyes: Cataracts Immunizations Pneumococcal Vaccine: Received Pneumococcal Vaccination: Yes Implantable Devices Family and Social History Cancer: Yes - Father; Diabetes: No; Heart Disease: Yes - Paternal Grandparents; Hypertension: No; Kidney Disease: No; Lung Disease: Yes - Father; Seizures: No; Stroke: No; Thyroid Problems: No; Tuberculosis: No; Never smoker; Marital Status - Widowed; Alcohol Use: Never; Drug Use:  No History; Caffeine Use: Daily; Advanced Directives: No; Patient does not want information on Advanced Directives; Do not resuscitate: No; Living Will: No; Medical Power of Attorney: No Electronic Signature(s) Signed: 05/09/2017 4:49:03 PM By: Lenda Kelp PA-C Signed: 05/09/2017 5:31:47 PM By: Elliot Gurney, BSN, RN, CWS, Kim RN, BSN Entered By: Elliot Gurney, BSN, RN, CWS, Kim on 05/09/2017 08:41:11 Megan Carpenter ([id number]) -------------------------------------------------------------------------------- SuperBill Details Patient Name: Megan Carpenter Date of Service: 05/09/2017 Medical Record Number: [id number] Patient Account Number: [id number] Date of Birth/Sex: May 23, 1928 (84 y.o. Female) Treating RN: Huel Coventry Primary Care Provider: Jonna Clark, Fayrene Fearing Other Clinician: Referring Provider: Jonna Clark, Fayrene Fearing Treating Provider/Extender: Linwood Dibbles, HOYT Weeks in Treatment: 0 Diagnosis Coding ICD-10  Codes Code Description S91.001A Unspecified open wound, right ankle, initial encounter L97.312 Non-pressure chronic ulcer of right ankle with fat layer exposed F03.90 Unspecified dementia without behavioral disturbance E44.0 Moderate protein-calorie malnutrition I10 Essential (primary) hypertension R73.03 Prediabetes Facility Procedures CPT4 Code: 25956387 Description: 99213 - WOUND CARE VISIT-LEV 3 EST PT Modifier: Quantity: 1 CPT4 Code: 56433295 Description: 11042 - DEB SUBQ TISSUE 20 SQ CM/< ICD-10 Diagnosis Description L97.312 Non-pressure chronic ulcer of right ankle with fat layer ex Modifier: posed Quantity: 1 Physician Procedures CPT4 Code: 1884166 Description: WC PHYS LEVEL 3 o NEW PT ICD-10 Diagnosis Description S91.001A Unspecified open wound, right ankle, initial encounter L97.312 Non-pressure chronic ulcer of right ankle with fat layer ex F03.90 Unspecified dementia without behavioral  disturbance E44.0 Moderate protein-calorie malnutrition Modifier: 25 posed Quantity: 1 CPT4 Code: 0630160 Description: 11042 - WC PHYS SUBQ TISS 20 SQ CM ICD-10 Diagnosis Description L97.312 Non-pressure chronic ulcer of right ankle with fat layer ex Modifier: posed Quantity: 1 Electronic Signature(s) Signed: 06/03/2017 10:42:09 AM By: Lenda Kelp PA-C Previous Signature: 05/09/2017 4:49:03 PM Version By: Lenda Kelp PA-C Entered By: Lenda Kelp on 06/03/2017 09:35:28

## 2017-05-11 NOTE — Progress Notes (Signed)
Megan RedderDOBY, Georgianna J. ([id number]) Visit Report for 05/09/2017 Abuse/Suicide Risk Screen Details Patient Name: Megan RedderDOBY, Megan J. Date of Service: 05/09/2017 8:00 AM Medical Record Number: [id number] Patient Account Number: [id number] Date of Birth/Sex: 10/01/1928 (84 y.o. Female) Treating RN: Huel CoventryWoody, Kim Primary Care Kaye Luoma: Jonna ClarkHawkins Jr, Fayrene FearingJames Other Clinician: Referring Beckham Capistran: Referral, Self Treating Dyon Rotert/Extender: Linwood DibblesSTONE III, HOYT Weeks in Treatment: 0 Abuse/Suicide Risk Screen Items Answer ABUSE/SUICIDE RISK SCREEN: Has anyone close to you tried to hurt or harm you recentlyo No Do you feel uncomfortable with anyone in your familyo No Has anyone forced you do things that you didnot want to doo No Do you have any thoughts of harming yourselfo No Patient displays signs or symptoms of abuse and/or neglect. No Electronic Signature(s) Signed: 05/09/2017 5:31:47 PM By: Elliot GurneyWoody, BSN, RN, CWS, Kim RN, BSN Entered By: Elliot GurneyWoody, BSN, RN, CWS, Kim on 05/09/2017 08:41:20 Megan RedderBY, Aloni J. ([id number]) -------------------------------------------------------------------------------- Activities of Daily Living Details Patient Name: Megan RedderDOBY, Megan J. Date of Service: 05/09/2017 8:00 AM Medical Record Number: [id number] Patient Account Number: [id number] Date of Birth/Sex: 10/01/1928 (84 y.o. Female) Treating RN: Huel CoventryWoody, Kim Primary Care Teodor Prater: Jonna ClarkHawkins Jr, Fayrene FearingJames Other Clinician: Referring Peightyn Roberson: Referral, Self Treating Jessamyn Watterson/Extender: Linwood DibblesSTONE III, HOYT Weeks in Treatment: 0 Activities of Daily Living Items Answer Activities of Daily Living (Please select one for each item) Drive Automobile Not Able Take Medications Need Assistance Use Telephone Need Assistance Care for Appearance Need Assistance Use Toilet Need Assistance Bath / Shower Need Assistance Dress Self Need Assistance Feed Self Need Assistance Walk Need Assistance Get In / Out Bed Need Assistance Housework Need Assistance Prepare  Meals Need Assistance Handle Money Need Assistance Shop for Self Need Assistance Electronic Signature(s) Signed: 05/09/2017 5:31:47 PM By: Elliot GurneyWoody, BSN, RN, CWS, Kim RN, BSN Entered By: Elliot GurneyWoody, BSN, RN, CWS, Kim on 05/09/2017 08:41:38 Megan RedderBY, Nekeshia J. ([id number]) -------------------------------------------------------------------------------- Education Assessment Details Patient Name: Megan RedderBY, Megan J. Date of Service: 05/09/2017 8:00 AM Medical Record Number: [id number] Patient Account Number: [id number] Date of Birth/Sex: 10/01/1928 (84 y.o. Female) Treating RN: Huel CoventryWoody, Kim Primary Care Octavia Velador: Jonna ClarkHawkins Jr, Fayrene FearingJames Other Clinician: Referring Demetrius Barrell: Referral, Self Treating Elverna Caffee/Extender: Skeet SimmerSTONE III, HOYT Weeks in Treatment: 0 Primary Learner Assessed: Patient Learning Preferences/Education Level/Primary Language Learning Preference: Explanation, Demonstration Highest Education Level: High School Preferred Language: English Cognitive Barrier Assessment/Beliefs Language Barrier: No Translator Needed: No Memory Deficit: No Emotional Barrier: No Cultural/Religious Beliefs Affecting Medical Care: No Physical Barrier Assessment Impaired Vision: No Impaired Hearing: No Decreased Hand dexterity: No Knowledge/Comprehension Assessment Knowledge Level: High Comprehension Level: High Ability to understand written High instructions: Ability to understand verbal High instructions: Motivation Assessment Anxiety Level: Calm Cooperation: Cooperative Education Importance: Acknowledges Need Interest in Health Problems: Asks Questions Perception: Coherent Willingness to Engage in Self- High Management Activities: Readiness to Engage in Self- High Management Activities: Electronic Signature(s) Signed: 05/09/2017 5:31:47 PM By: Elliot GurneyWoody, BSN, RN, CWS, Kim RN, BSN Entered By: Elliot GurneyWoody, BSN, RN, CWS, Kim on 05/09/2017 08:42:06 Megan RedderBY, Keiona J.  ([id number]) -------------------------------------------------------------------------------- Fall Risk Assessment Details Patient Name: Megan RedderBY, Megan J. Date of Service: 05/09/2017 8:00 AM Medical Record Number: [id number] Patient Account Number: [id number] Date of Birth/Sex: 10/01/1928 (84 y.o. Female) Treating RN: Huel CoventryWoody, Kim Primary Care Tishara Pizano: Jonna ClarkHawkins Jr, Fayrene FearingJames Other Clinician: Referring Rosalita Carey: Referral, Self Treating Kristiann Noyce/Extender: Linwood DibblesSTONE III, HOYT Weeks in Treatment: 0 Fall Risk Assessment Items Have you had 2 or more falls in the last 12 monthso 0 Yes Have you had any fall that resulted in injury in the last 12 monthso 0 Yes FALL  RISK ASSESSMENT: History of falling - immediate or within 3 months 25 Yes Secondary diagnosis 0 No Ambulatory aid None/bed rest/wheelchair/nurse 0 Yes Crutches/cane/walker 0 No Furniture 0 No IV Access/Saline Lock 0 No Gait/Training Normal/bed rest/immobile 0 Yes Weak 0 No Impaired 0 No Mental Status Oriented to own ability 0 Yes Electronic Signature(s) Signed: 05/09/2017 5:31:47 PM By: Elliot Gurney, BSN, RN, CWS, Kim RN, BSN Entered By: Elliot Gurney, BSN, RN, CWS, Kim on 05/09/2017 08:42:43 Megan Carpenter ([id number]) -------------------------------------------------------------------------------- Foot Assessment Details Patient Name: Megan Carpenter Date of Service: 05/09/2017 8:00 AM Medical Record Number: [id number] Patient Account Number: [id number] Date of Birth/Sex: [birth year] (84 y.o. Female) Treating RN: Huel Coventry Primary Care Annisten Manchester: Jonna Clark, Fayrene Fearing Other Clinician: Referring Daily Crate: Referral, Self Treating Megan Carpenter/Extender: Linwood Dibbles, HOYT Weeks in Treatment: 0 Foot Assessment Items [x]  Unable to perform due to altered mental status Site Locations + = Sensation present, - = Sensation absent, C = Callus, U = Ulcer R = Redness, W = Warmth, M = Maceration, PU = Pre-ulcerative lesion F = Fissure, S = Swelling, D =  Dryness Assessment Right: Left: Other Deformity: No No Prior Foot Ulcer: No No Prior Amputation: No No Charcot Joint: No No Ambulatory Status: Ambulatory Without Help Gait: Steady Electronic Signature(s) Signed: 05/09/2017 5:31:47 PM By: Elliot Gurney, BSN, RN, CWS, Kim RN, BSN Entered By: Elliot Gurney, BSN, RN, CWS, Kim on 05/09/2017 08:43:28 Megan Carpenter ([id number]) -------------------------------------------------------------------------------- Nutrition Risk Assessment Details Patient Name: Megan Carpenter Date of Service: 05/09/2017 8:00 AM Medical Record Number: [id number] Patient Account Number: [id number] Date of Birth/Sex: 04/22/1928 (84 y.o. Female) Treating RN: Huel Coventry Primary Care Poppi Scantling: Jonna Clark, Fayrene Fearing Other Clinician: Referring Marshon Bangs: Referral, Self Treating Eliav Mechling/Extender: Linwood Dibbles, HOYT Weeks in Treatment: 0 Height (in): 60 Weight (lbs): 88.3 Body Mass Index (BMI): 17.2 Nutrition Risk Assessment Items NUTRITION RISK SCREEN: I have an illness or condition that made me change the kind and/or amount of 0 No food I eat I eat fewer than two meals per day 3 Yes I eat few fruits and vegetables, or milk products 0 No I have three or more drinks of beer, liquor or wine almost every day 0 No I have tooth or mouth problems that make it hard for me to eat 0 No I don't always have enough money to buy the food I need 0 No I eat alone most of the time 1 Yes I take three or more different prescribed or over-the-counter drugs a day 0 No Without wanting to, I have lost or gained 10 pounds in the last six months 0 No I am not always physically able to shop, cook and/or feed myself 0 No Nutrition Protocols Good Risk Protocol Provide education on Moderate Risk Protocol 0 nutrition Electronic Signature(s) Signed: 05/09/2017 5:31:47 PM By: Elliot Gurney, BSN, RN, CWS, Kim RN, BSN Entered By: Elliot Gurney, BSN, RN, CWS, Kim on 05/09/2017 08:43:10

## 2017-05-13 NOTE — Progress Notes (Signed)
Megan, Carpenter ([id number]) Visit Report for 05/09/2017 Allergy List Details Patient Name: Megan Carpenter, Megan Carpenter. Date of Service: 05/09/2017 8:00 AM Medical Record Number: [id number] Patient Account Number: [id number] Date of Birth/Sex: 07-21-1928 (84 y.o. Female) Treating RN: Huel Coventry Primary Care Megan Carpenter: Jonna Clark, Fayrene Fearing Other Clinician: Referring Ivory Bail: Referral, Self Treating Jihan Rudy/Extender: Linwood Dibbles, HOYT Weeks in Treatment: 0 Allergies Active Allergies penicillin Reaction: rash, swelling Allergy Notes Electronic Signature(s) Signed: 05/09/2017 5:31:47 PM By: Elliot Gurney, BSN, RN, CWS, Kim RN, BSN Entered By: Elliot Gurney, BSN, RN, CWS, Kim on 05/09/2017 08:36:06 Megan Carpenter ([id number]) -------------------------------------------------------------------------------- Arrival Information Details Patient Name: Megan Carpenter Date of Service: 05/09/2017 8:00 AM Medical Record Number: [id number] Patient Account Number: [id number] Date of Birth/Sex: Jun 03, 1928 (84 y.o. Female) Treating RN: Huel Coventry Primary Care Aamirah Salmi: Jonna Clark, Fayrene Fearing Other Clinician: Referring Iya Hamed: Referral, Self Treating Cina Klumpp/Extender: Linwood Dibbles, HOYT Weeks in Treatment: 0 Visit Information Patient Arrived: Ambulatory Arrival Time: 08:32 Accompanied By: daughter Transfer Assistance: None Patient Identification Verified: Yes Secondary Verification Process Completed: Yes Patient Has Alerts: Yes Electronic Signature(s) Signed: 05/09/2017 5:31:47 PM By: Elliot Gurney, BSN, RN, CWS, Kim RN, BSN Entered By: Elliot Gurney, BSN, RN, CWS, Kim on 05/09/2017 08:32:40 Megan Carpenter ([id number]) -------------------------------------------------------------------------------- Clinic Level of Care Assessment Details Patient Name: Megan, [name]. Date of Service: 05/09/2017 8:00 AM Medical Record Number: [id number] Patient Account Number: [id number] Date of Birth/Sex: 1928/12/13 (84 y.o. Female) Treating RN: Huel Coventry Primary Care Dasie Chancellor: Jonna Clark, Fayrene Fearing Other Clinician: Referring Ruben Pyka: Referral, Self Treating Mansur Patti/Extender: Linwood Dibbles, HOYT Weeks in Treatment: 0 Clinic Level of Care Assessment Items TOOL 1 Quantity Score []  - Use when EandM and Procedure is performed on INITIAL visit 0 ASSESSMENTS - Nursing Assessment / Reassessment X - General Physical Exam (combine w/ comprehensive assessment (listed just below) when 1 20 performed on new pt. evals) X- 1 25 Comprehensive Assessment (HX, ROS, Risk Assessments, Wounds Hx, etc.) ASSESSMENTS - Wound and Skin Assessment / Reassessment []  - Dermatologic / Skin Assessment (not related to wound area) 0 ASSESSMENTS - Ostomy and/or Continence Assessment and Care []  - Incontinence Assessment and Management 0 []  - 0 Ostomy Care Assessment and Management (repouching, etc.) PROCESS - Coordination of Care X - Simple Patient / Family Education for ongoing care 1 15 []  - 0 Complex (extensive) Patient / Family Education for ongoing care X- 1 10 Staff obtains Chiropractor, Records, Test Results / Process Orders []  - 0 Staff telephones HHA, Nursing Homes / Clarify orders / etc []  - 0 Routine Transfer to another Facility (non-emergent condition) []  - 0 Routine Hospital Admission (non-emergent condition) X- 1 15 New Admissions / Manufacturing engineer / Ordering NPWT, Apligraf, etc. []  - 0 Emergency Hospital Admission (emergent condition) PROCESS - Special Needs []  - Pediatric / Minor Patient Management 0 []  - 0 Isolation Patient Management []  - 0 Hearing / Language / Visual special needs []  - 0 Assessment of Community assistance (transportation, D/C planning, etc.) []  - 0 Additional assistance / Altered mentation []  - 0 Support Surface(s) Assessment (bed, cushion, seat, etc.) Wygant, Helayne J. ([id number]) INTERVENTIONS - Miscellaneous []  - External ear exam 0 []  - 0 Patient Transfer (multiple staff / Nurse, adult / Similar devices) []   - 0 Simple Staple / Suture removal (25 or less) []  - 0 Complex Staple / Suture removal (26 or more) []  - 0 Hypo/Hyperglycemic Management (do not check if billed separately) X- 1 15 Ankle / Brachial Index (ABI) - do not check if billed separately Has  the patient been seen at the hospital within the last three years: Yes Total Score: 100 Level Of Care: New/Established - Level 3 Electronic Signature(s) Signed: 05/09/2017 5:31:47 PM By: Elliot GurneyWoody, BSN, RN, CWS, Kim RN, BSN Entered By: Elliot GurneyWoody, BSN, RN, CWS, Kim on 05/09/2017 10:18:59 Megan Carpenter, Megan J. ([id number]) -------------------------------------------------------------------------------- Encounter Discharge Information Details Patient Name: Megan Carpenter, Megan J. Date of Service: 05/09/2017 8:00 AM Medical Record Number: [id number] Patient Account Number: [id number] Date of Birth/Sex: [birth year] (84 y.o. Female) Treating RN: Huel CoventryWoody, Kim Primary Care Maraki Macquarrie: Jonna ClarkHawkins Jr, Fayrene FearingJames Other Clinician: Referring Bralynn Velador: Referral, Self Treating Jelani Vreeland/Extender: Linwood DibblesSTONE III, HOYT Weeks in Treatment: 0 Encounter Discharge Information Items Discharge Pain Level: 0 Discharge Condition: Stable Ambulatory Status: Ambulatory Discharge Destination: Home Transportation: Private Auto Accompanied By: daughter Schedule Follow-up Appointment: Yes Medication Reconciliation completed and No provided to Patient/Care Chantil Bari: Provided on Clinical Summary of Care: 05/09/2017 Form Type Recipient Paper Patient BD Electronic Signature(s) Signed: 05/13/2017 10:47:22 AM By: Gwenlyn PerkingMoore, Shelia Entered By: Gwenlyn PerkingMoore, Shelia on 05/09/2017 09:31:18 Megan Carpenter, Megan J. ([id number]) -------------------------------------------------------------------------------- General Visit Notes Details Patient Name: Megan Carpenter, Megan J. Date of Service: 05/09/2017 8:00 AM Medical Record Number: [id number] Patient Account Number: [id number] Date of Birth/Sex: [birth year] (84 y.o. Female) Treating RN:  Huel CoventryWoody, Kim Primary Care Ellis Koffler: Jonna ClarkHawkins Jr, Fayrene FearingJames Other Clinician: Referring Efstathios Sawin: Referral, Self Treating Kearsten Ginther/Extender: Linwood DibblesSTONE III, HOYT Weeks in Treatment: 0 Notes Patient lives alone and has dementia. Daughter states the patient has many cats that live inside and outside. Patient smells of cat urine. Daughter also states she does not think Mom is eating. [employer] delivers daily to her mom, but daughter thinks she gives most of her food to the cats. Daughter states her mother has had several falls in the past months and shows me a photo of her knee that was swollen and bruised after one of the falls. Patient states she has not fallen at all. Daughter also states that they ate looking into assisted care, but it is so expensive. Electronic Signature(s) Signed: 05/09/2017 10:30:50 AM By: Elliot GurneyWoody, BSN, RN, CWS, Kim RN, BSN Previous Signature: 05/09/2017 10:30:03 AM Version By: Elliot GurneyWoody, BSN, RN, CWS, Kim RN, BSN Entered By: Elliot GurneyWoody, BSN, RN, CWS, Kim on 05/09/2017 10:30:50 Megan Carpenter, Megan J. ([id number]) -------------------------------------------------------------------------------- Lower Extremity Assessment Details Patient Name: Megan Carpenter, Megan J. Date of Service: 05/09/2017 8:00 AM Medical Record Number: [id number] Patient Account Number: [id number] Date of Birth/Sex: [birth year] (84 y.o. Female) Treating RN: Huel CoventryWoody, Kim Primary Care Sharesa Kemp: Jonna ClarkHawkins Jr, Fayrene FearingJames Other Clinician: Referring Aneri Slagel: Referral, Self Treating Taylia Berber/Extender: Linwood DibblesSTONE III, HOYT Weeks in Treatment: 0 Edema Assessment Assessed: [Left: No] [Right: No] Edema: [Left: No] [Right: No] Vascular Assessment Pulses: Dorsalis Pedis Palpable: [Left:Yes] [Right:Yes] Posterior Tibial Palpable: [Left:Yes] [Right:Yes] Extremity colors, hair growth, and conditions: Extremity Color: [Left:Normal] [Right:Normal] Hair Growth on Extremity: [Left:Yes] [Right:Yes] Temperature of Extremity: [Left:Warm]  [Right:Warm] Capillary Refill: [Left:< 3 seconds] [Right:< 3 seconds] Blood Pressure: Brachial: [Right:128] Dorsalis Pedis: [Left:Dorsalis Pedis: 140] Ankle: Posterior Tibial: [Left:Posterior Tibial: 150] [Right:1.17] Toe Nail Assessment Left: Right: Thick: Yes Yes Discolored: Yes Yes Deformed: Yes Yes Improper Length and Hygiene: Yes Yes Electronic Signature(s) Signed: 05/09/2017 5:31:47 PM By: Elliot GurneyWoody, BSN, RN, CWS, Kim RN, BSN Entered By: Elliot GurneyWoody, BSN, RN, CWS, Kim on 05/09/2017 08:53:14 Mira, Sharyn LullBARBARA J. ([id number]) -------------------------------------------------------------------------------- Multi Wound Chart Details Patient Name: Megan Carpenter, Megan J. Date of Service: 05/09/2017 8:00 AM Medical Record Number: [id number] Patient Account Number: [id number] Date of Birth/Sex: [birth year] (84 y.o. Female) Treating RN: Huel CoventryWoody, Kim Primary Care Sherel Fennell: Jonna ClarkHawkins Jr, Fayrene FearingJames Other Clinician:  Referring Mikael Debell: Referral, Self Treating Leigha Olberding/Extender: STONE III, HOYT Weeks in Treatment: 0 Vital Signs Height(in): 60 Pulse(bpm): 81 Weight(lbs): 88.3 Blood Pressure(mmHg): 144/68 Body Mass Index(BMI): 17 Temperature(F): 97.5 Respiratory Rate 16 (breaths/min): Photos: [N/A:N/A] Wound Location: Right Ankle - Lateral N/A N/A Wounding Event: Trauma N/A N/A Primary Etiology: Trauma, Other N/A N/A Comorbid History: Cataracts, Hypertension, N/A N/A Dementia Date Acquired: 04/25/2017 N/A N/A Weeks of Treatment: 0 N/A N/A Wound Status: Open N/A N/A Measurements L x W x D 0.5x0.7x0.1 N/A N/A (cm) Area (cm) : 0.275 N/A N/A Volume (cm) : 0.027 N/A N/A % Reduction in Area: 0.00% N/A N/A % Reduction in Volume: 0.00% N/A N/A Classification: Full Thickness Without N/A N/A Exposed Support Structures Exudate Amount: Medium N/A N/A Exudate Type: Serous N/A N/A Exudate Color: amber N/A N/A Wound Margin: Flat and Intact N/A N/A Granulation Amount: None Present (0%) N/A N/A Necrotic  Amount: Large (67-100%) N/A N/A Necrotic Tissue: Eschar, Adherent Slough N/A N/A Exposed Structures: Fat Layer (Subcutaneous N/A N/A Tissue) Exposed: Yes Fascia: No Tendon: No Muscle: No Joint: No Bone: No Epithelialization: None N/A N/A Bianchini, Dajha J. ([id number]) Periwound Skin Texture: Excoriation: No N/A N/A Induration: No Callus: No Crepitus: No Rash: No Scarring: No Periwound Skin Moisture: Maceration: No N/A N/A Dry/Scaly: No Periwound Skin Color: Rubor: Yes N/A N/A Atrophie Blanche: No Cyanosis: No Ecchymosis: No Erythema: No Hemosiderin Staining: No Mottled: No Pallor: No Tenderness on Palpation: No N/A N/A Wound Preparation: Ulcer Cleansing: N/A N/A Rinsed/Irrigated with Saline Topical Anesthetic Applied: Other: lidocaine 4% Treatment Notes Electronic Signature(s) Signed: 05/09/2017 5:31:47 PM By: Elliot Gurney, BSN, RN, CWS, Kim RN, BSN Entered By: Elliot Gurney, BSN, RN, CWS, Kim on 05/09/2017 09:11:35 Megan Carpenter ([id number]) -------------------------------------------------------------------------------- Multi-Disciplinary Care Plan Details Patient Name: DENISSA, COZART. Date of Service: 05/09/2017 8:00 AM Medical Record Number: [id number] Patient Account Number: [id number] Date of Birth/Sex: [birth year] (84 y.o. Female) Treating RN: Huel Coventry Primary Care Kaimana Neuzil: Jonna Clark, Fayrene Fearing Other Clinician: Referring Nakina Spatz: Referral, Self Treating Rykker Coviello/Extender: Linwood Dibbles, HOYT Weeks in Treatment: 0 Active Inactive ` Orientation to the Wound Care Program Nursing Diagnoses: Knowledge deficit related to the wound healing center program Goals: Patient/caregiver will verbalize understanding of the Wound Healing Center Program Date Initiated: 05/09/2017 Target Resolution Date: 06/09/2017 Goal Status: Active Interventions: Provide education on orientation to the wound center Notes: ` Wound/Skin Impairment Nursing Diagnoses: Impaired tissue  integrity Goals: Patient/caregiver will verbalize understanding of skin care regimen Date Initiated: 05/09/2017 Target Resolution Date: 06/09/2017 Goal Status: Active Ulcer/skin breakdown will have a volume reduction of 30% by week 4 Date Initiated: 05/09/2017 Target Resolution Date: 06/09/2017 Goal Status: Active Interventions: Assess patient/caregiver ability to obtain necessary supplies Assess patient/caregiver ability to perform ulcer/skin care regimen upon admission and as needed Assess ulceration(s) every visit Treatment Activities: Skin care regimen initiated : 05/09/2017 Notes: Electronic Signature(s) Signed: 05/09/2017 5:31:47 PM By: Elliot Gurney, BSN, RN, CWS, Kim RN, BSN Koran, Sharyn Lull ([id number]) Entered By: Elliot Gurney, BSN, RN, CWS, Kim on 05/09/2017 09:11:21 Megan Carpenter ([id number]) -------------------------------------------------------------------------------- Pain Assessment Details Patient Name: Megan Carpenter Date of Service: 05/09/2017 8:00 AM Medical Record Number: [id number] Patient Account Number: [id number] Date of Birth/Sex: [birth year] (84 y.o. Female) Treating RN: Huel Coventry Primary Care Yudit Modesitt: Jonna Clark, Fayrene Fearing Other Clinician: Referring Kavitha Lansdale: Referral, Self Treating Nikayla Madaris/Extender: Linwood Dibbles, HOYT Weeks in Treatment: 0 Active Problems Location of Pain Severity and Description of Pain Patient Has Paino No Site Locations With Dressing Change: No Pain Management and Medication Current Pain Management: Goals  for Pain Management Topical or injectable lidocaine is offered to patient for acute pain when surgical debridement is performed. If needed, Patient is instructed to use over the counter pain medication for the following 24-48 hours after debridement. Wound care MDs do not prescribed pain medications. Patient has chronic pain or uncontrolled pain. Patient has been instructed to make an appointment with their Primary Care Physician for pain  management. Electronic Signature(s) Signed: 05/09/2017 5:31:47 PM By: Elliot Gurney, BSN, RN, CWS, Kim RN, BSN Entered By: Elliot Gurney, BSN, RN, CWS, Kim on 05/09/2017 08:33:02 Megan Carpenter ([id number]) -------------------------------------------------------------------------------- Patient/Caregiver Education Details Patient Name: Megan Carpenter Date of Service: 05/09/2017 8:00 AM Medical Record Number: [id number] Patient Account Number: [id number] Date of Birth/Gender: September 28, 1928 (84 y.o. Female) Treating RN: Huel Coventry Primary Care Physician: Jonna Clark, Fayrene Fearing Other Clinician: Referring Physician: Referral, Self Treating Physician/Extender: Skeet Simmer in Treatment: 0 Education Assessment Education Provided To: Patient Education Topics Provided Welcome To The [facility]ound [facility] Center: Handouts: Welcome To The [facility] Methods: Explain/Verbal Responses: State content correctly Wound/Skin Impairment: Handouts: Caring for Your Ulcer, Other: change dressing as ordered Methods: Demonstration, Explain/Verbal Responses: State content correctly Electronic Signature(s) Signed: 05/09/2017 5:31:47 PM By: Elliot Gurney, BSN, RN, CWS, Kim RN, BSN Entered By: Elliot Gurney, BSN, RN, CWS, Kim on 05/09/2017 09:12:32 Megan Carpenter ([id number]) -------------------------------------------------------------------------------- Wound Assessment Details Patient Name: Megan Carpenter Date of Service: 05/09/2017 8:00 AM Medical Record Number: [id number] Patient Account Number: [id number] Date of Birth/Sex: June 11, 1928 (84 y.o. Female) Treating RN: Huel Coventry Primary Care Shaniyah Wix: Jonna Clark, Fayrene Fearing Other Clinician: Referring Truth Barot: Referral, Self Treating Kayleeann Huxford/Extender: Linwood Dibbles, HOYT Weeks in Treatment: 0 Wound Status Wound Number: 1 Primary Etiology: Trauma, Other Wound Location: Right Ankle - Lateral Wound Status: Open Wounding Event: Trauma Comorbid History: Cataracts, Hypertension,  Dementia Date Acquired: 04/25/2017 Weeks Of Treatment: 0 Clustered Wound: No Photos Wound Measurements Length: (cm) 0.5 Width: (cm) 0.7 Depth: (cm) 0.1 Area: (cm) 0.275 Volume: (cm) 0.027 % Reduction in Area: 0% % Reduction in Volume: 0% Epithelialization: None Tunneling: No Undermining: No Wound Description Full Thickness Without Exposed Support Classification: Structures Wound Margin: Flat and Intact Exudate Medium Amount: Exudate Type: Serous Exudate Color: amber Foul Odor After Cleansing: No Slough/Fibrino Yes Wound Bed Granulation Amount: None Present (0%) Exposed Structure Necrotic Amount: Large (67-100%) Fascia Exposed: No Necrotic Quality: Eschar, Adherent Slough Fat Layer (Subcutaneous Tissue) Exposed: Yes Tendon Exposed: No Muscle Exposed: No Joint Exposed: No Bone Exposed: No Periwound Skin Texture Texture Color No Abnormalities Noted: No No Abnormalities Noted: No Ouellet, Artina J. ([id number]) Callus: No Atrophie Blanche: No Crepitus: No Cyanosis: No Excoriation: No Ecchymosis: No Induration: No Erythema: No Rash: No Hemosiderin Staining: No Scarring: No Mottled: No Pallor: No Moisture Rubor: Yes No Abnormalities Noted: No Dry / Scaly: No Maceration: No Wound Preparation Ulcer Cleansing: Rinsed/Irrigated with Saline Topical Anesthetic Applied: Other: lidocaine 4%, Treatment Notes Wound #1 (Right, Lateral Ankle) 1. Cleansed with: Clean wound with Normal Saline 2. Anesthetic Topical Lidocaine 4% cream to wound bed prior to debridement 3. Megan-wound Care: Skin Prep 4. Dressing Applied: Other dressing (specify in notes) 5. Secondary Dressing Applied Bordered Foam Dressing Notes silvercel Electronic Signature(s) Signed: 05/09/2017 5:31:47 PM By: Elliot Gurney, BSN, RN, CWS, Kim RN, BSN Entered By: Elliot Gurney, BSN, RN, CWS, Kim on 05/09/2017 08:47:13 Megan Carpenter  ([id number]) -------------------------------------------------------------------------------- Vitals Details Patient Name: Megan Carpenter Date of Service: 05/09/2017 8:00 AM Medical Record Number: [id number] Patient Account Number: [id number] Date of Birth/Sex: May 22, 1928 (84 y.o.  Female) Treating RN: Huel Coventry Primary Care Holli Rengel: Jonna Clark, Fayrene Fearing Other Clinician: Referring Rashard Ryle: Referral, Self Treating Nastashia Gallo/Extender: Linwood Dibbles, HOYT Weeks in Treatment: 0 Vital Signs Time Taken: 08:33 Temperature (F): 97.5 Height (in): 60 Pulse (bpm): 81 Source: Measured Respiratory Rate (breaths/min): 16 Weight (lbs): 88.3 Blood Pressure (mmHg): 144/68 Source: Measured Reference Range: 80 - 120 mg / dl Body Mass Index (BMI): 17.2 Electronic Signature(s) Signed: 05/09/2017 5:31:47 PM By: Elliot Gurney, BSN, RN, CWS, Kim RN, BSN Entered By: Elliot Gurney, BSN, RN, CWS, Kim on 05/09/2017 08:33:49

## 2017-05-16 ENCOUNTER — Encounter: Payer: Medicare Other | Attending: Physician Assistant | Admitting: Physician Assistant

## 2017-05-16 DIAGNOSIS — F0390 Unspecified dementia without behavioral disturbance: Secondary | ICD-10-CM | POA: Insufficient documentation

## 2017-05-16 DIAGNOSIS — E441 Mild protein-calorie malnutrition: Secondary | ICD-10-CM | POA: Insufficient documentation

## 2017-05-16 DIAGNOSIS — L97512 Non-pressure chronic ulcer of other part of right foot with fat layer exposed: Secondary | ICD-10-CM | POA: Diagnosis not present

## 2017-05-16 DIAGNOSIS — Z681 Body mass index (BMI) 19 or less, adult: Secondary | ICD-10-CM | POA: Insufficient documentation

## 2017-05-16 DIAGNOSIS — L97312 Non-pressure chronic ulcer of right ankle with fat layer exposed: Secondary | ICD-10-CM | POA: Insufficient documentation

## 2017-05-16 DIAGNOSIS — I10 Essential (primary) hypertension: Secondary | ICD-10-CM | POA: Diagnosis not present

## 2017-05-18 NOTE — Progress Notes (Addendum)
Megan RedderDOBY, Randal J. ([id number]) Visit Report for 05/16/2017 Arrival Information Details Patient Name: Megan RedderDOBY, Megan J. Date of Service: 05/16/2017 1:45 PM Medical Record Number: [id number] Patient Account Number: [id number] Date of Birth/Sex: 18-Nov-1928 (84 y.o. Female) Treating RN: Renne CriglerFlinchum, Cheryl Primary Care Epsie Walthall: Jonna ClarkHawkins Jr, Fayrene FearingJames Other Clinician: Referring Bransen Fassnacht: Jonna ClarkHawkins Jr, Fayrene FearingJames Treating Jordyn Doane/Extender: Linwood DibblesSTONE III, HOYT Weeks in Treatment: 1 Visit Information History Since Last Visit All ordered tests and consults were completed: No Patient Arrived: Ambulatory Added or deleted any medications: No Arrival Time: 14:15 Any new allergies or adverse reactions: No Accompanied By: daughter Had a fall or experienced change in No Transfer Assistance: None activities of daily living that may affect Patient Identification Verified: Yes risk of falls: Secondary Verification Process Completed: Yes Signs or symptoms of abuse/neglect since last visito No Patient Has Alerts: Yes Hospitalized since last visit: No Pain Present Now: No Electronic SignatureCarpenters) Signed: 05/16/2017 5:07:18 PM By: Renne CriglerFlinchum, Cheryl Entered By: Renne CriglerFlinchum, Cheryl on 05/16/2017 14:17:14 Megan Carpenter, Megan J. ([id number]) -------------------------------------------------------------------------------- Encounter Discharge Information Details Patient Name: Megan Carpenter, Megan J. Date of Service: 05/16/2017 1:45 PM Medical Record Number: [id number] Patient Account Number: [id number] Date of Birth/Sex: 18-Nov-1928 (84 y.o. Female) Treating RN: Renne CriglerFlinchum, Cheryl Primary Care Jermall Isaacson: Jonna ClarkHawkins Jr, Fayrene FearingJames Other Clinician: Referring Leonarda Leis: Jonna ClarkHawkins Jr, Fayrene FearingJames Treating Arizbeth Cawthorn/Extender: Linwood DibblesSTONE III, HOYT Weeks in Treatment: 1 Encounter Discharge Information Items Discharge Pain Level: 0 Discharge Condition: Stable Ambulatory Status: Ambulatory Discharge Destination: Home Transportation: Private Auto Accompanied By:  daughter Schedule Follow-up Appointment: Yes Medication Reconciliation completed and No provided to Patient/Care Gitel Beste: Provided on Clinical Summary of Care: 05/16/2017 Form Type Recipient Paper Patient BD Electronic SignatureCarpenters) Signed: 05/16/2017 4:27:15 PM By: Gwenlyn PerkingMoore, Shelia Entered By: Gwenlyn PerkingMoore, Shelia on 05/16/2017 14:36:21 Urbani, Sharyn LullBARBARA J. ([id number]) -------------------------------------------------------------------------------- Lower Extremity Assessment Details Patient Name: Megan Carpenter, Megan J. Date of Service: 05/16/2017 1:45 PM Medical Record Number: [id number] Patient Account Number: [id number] Date of Birth/Sex: 18-Nov-1928 (84 y.o. Female) Treating RN: Renne CriglerFlinchum, Cheryl Primary Care Eleazar Kimmey: Jonna ClarkHawkins Jr, Fayrene FearingJames Other Clinician: Referring Terry Abila: Jonna ClarkHawkins Jr, Fayrene FearingJames Treating Casyn Becvar/Extender: Linwood DibblesSTONE III, HOYT Weeks in Treatment: 1 Edema Assessment Assessed: [Left: No] [Right: No] Edema: [Left: N] [Right: o] Vascular Assessment Claudication: Claudication Assessment [Right:None] Pulses: Dorsalis Pedis Palpable: [Right:Yes] Posterior Tibial Extremity colors, hair growth, and conditions: Extremity Color: [Right:Normal] Hair Growth on Extremity: [Right:Yes] Temperature of Extremity: [Right:Cool] Capillary Refill: [Right:< 3 seconds] Toe Nail Assessment Left: Right: Thick: Yes Discolored: Yes Deformed: No Improper Length and Hygiene: Yes Electronic SignatureCarpenters) Signed: 05/16/2017 5:07:18 PM By: Renne CriglerFlinchum, Cheryl Entered By: Renne CriglerFlinchum, Cheryl on 05/16/2017 14:22:59 Megan Carpenter, Javaya J. ([id number]) -------------------------------------------------------------------------------- Multi Wound Chart Details Patient Name: Megan Carpenter, Megan J. Date of Service: 05/16/2017 1:45 PM Medical Record Number: [id number] Patient Account Number: [id number] Date of Birth/Sex: 18-Nov-1928 (84 y.o. Female) Treating RN: Renne CriglerFlinchum, Cheryl Primary Care Diandra Cimini: Jonna ClarkHawkins Jr, Fayrene FearingJames Other  Clinician: Referring Shubh Chiara: Jonna ClarkHawkins Jr, Fayrene FearingJames Treating Clela Hagadorn/Extender: Linwood DibblesSTONE III, HOYT Weeks in Treatment: 1 Vital Signs HeightCarpenterin): 60 PulseCarpenterbpm): 82 WeightCarpenterlbs): 88.3 Blood PressureCarpentermmHg): 141/68 Body Mass IndexCarpenterBMI): 17 TemperatureCarpenterF): 98.0 Respiratory Rate 16 (breaths/min): Photos: [1:No Photos] [N/A:N/A] Wound Location: [1:Right Ankle - Lateral] [N/A:N/A] Wounding Event: [1:Trauma] [N/A:N/A] Primary Etiology: [1:Trauma, Other] [N/A:N/A] Comorbid History: [1:Cataracts, Hypertension, Dementia] [N/A:N/A] Date Acquired: [1:04/25/2017] [N/A:N/A] Weeks of Treatment: [1:1] [N/A:N/A] Wound Status: [1:Open] [N/A:N/A] Measurements L x W x D [1:0.4x0.6x0.2] [N/A:N/A] (cm) Area (cm) : [1:0.188] [N/A:N/A] Volume (cm) : [1:0.038] [N/A:N/A] % Reduction in Area: [1:31.60%] [N/A:N/A] % Reduction in Volume: [1:-40.70%] [N/A:N/A] Classification: [1:Full Thickness Without Exposed Support Structures] [N/A:N/A] Exudate Amount: [1:Medium] [N/A:N/A]  Exudate Type: [1:Serous] [N/A:N/A] Exudate Color: [1:amber] [N/A:N/A] Wound Margin: [1:Flat and Intact] [N/A:N/A] Granulation Amount: [1:None Present (0%)] [N/A:N/A] Necrotic Amount: [1:Large (67-100%)] [N/A:N/A] Necrotic Tissue: [1:Eschar, Adherent Slough] [N/A:N/A] Exposed Structures: [1:Fat Layer (Subcutaneous Tissue) Exposed: Yes Fascia: No Tendon: No Muscle: No Joint: No Bone: No] [N/A:N/A] Epithelialization: [1:None] [N/A:N/A] Periwound Skin Texture: [1:Excoriation: No Induration: No Callus: No Crepitus: No] [N/A:N/A] Rash: No Scarring: No Periwound Skin Moisture: Maceration: No N/A N/A Dry/Scaly: No Periwound Skin Color: Rubor: Yes N/A N/A Atrophie Blanche: No Cyanosis: No Ecchymosis: No Erythema: No Hemosiderin Staining: No Mottled: No Pallor: No Tenderness on Palpation: No N/A N/A Wound Preparation: Ulcer Cleansing: N/A N/A Rinsed/Irrigated with Saline Topical Anesthetic Applied: Other: lidocaine 4% Treatment  Notes Electronic SignatureCarpenters) Signed: 05/16/2017 5:07:18 PM By: Renne Crigler Entered By: Renne Crigler on 05/16/2017 14:23:10 Megan [id number]) -------------------------------------------------------------------------------- Multi-Disciplinary Care Plan Details Patient Name: Megan Carpenter Date of Service: 05/16/2017 1:45 PM Medical Record Number: [id number] Patient Account Number: [id number] Date of Birth/Sex: 04-03-1929 (84 y.o. Female) Treating RN: Renne Crigler Primary Care Itzae Miralles: Jonna Clark, Fayrene Fearing Other Clinician: Referring Hartley Urton: Jonna Clark, Fayrene Fearing Treating Dannell Gortney/Extender: Linwood Dibbles, HOYT Weeks in Treatment: 1 Active Inactive ` Orientation to the Wound Care Program Nursing Diagnoses: Knowledge deficit related to the wound healing center program Goals: Patient/caregiver will verbalize understanding of the Wound Healing Center Program Date Initiated: 05/09/2017 Target Resolution Date: 06/09/2017 Goal Status: Active Interventions: Provide education on orientation to the wound center Notes: ` Wound/Skin Impairment Nursing Diagnoses: Impaired tissue integrity Goals: Patient/caregiver will verbalize understanding of skin care regimen Date Initiated: 05/09/2017 Target Resolution Date: 06/09/2017 Goal Status: Active Ulcer/skin breakdown will have a volume reduction of 30% by week 4 Date Initiated: 05/09/2017 Target Resolution Date: 06/09/2017 Goal Status: Active Interventions: Assess patient/caregiver ability to obtain necessary supplies Assess patient/caregiver ability to perform ulcer/skin care regimen upon admission and as needed Assess ulcerationCarpenters) every visit Treatment Activities: Skin care regimen initiated : 05/09/2017 Notes: Electronic SignatureCarpenters) Signed: 05/16/2017 5:07:18 PM By: Shaune Spittle ([id number]) Entered By: Renne Crigler on 05/16/2017 14:23:04 Megan Carpenter  ([id number]) -------------------------------------------------------------------------------- Pain Assessment Details Patient Name: Megan Carpenter Date of Service: 05/16/2017 1:45 PM Medical Record Number: [id number] Patient Account Number: [id number] Date of Birth/Sex: [birth year] (84 y.o. Female) Treating RN: Renne Crigler Primary Care Asyia Hornung: Jonna Clark, Fayrene Fearing Other Clinician: Referring Glenna Brunkow: Jonna Clark, Fayrene Fearing Treating Bryor Rami/Extender: Linwood Dibbles, HOYT Weeks in Treatment: 1 Active Problems Location of Pain Severity and Description of Pain Patient Has Paino No Site Locations Pain Management and Medication Current Pain Management: Electronic SignatureCarpenters) Signed: 05/16/2017 5:07:18 PM By: Renne Crigler Entered By: Renne Crigler on 05/16/2017 14:17:21 Megan [id number]) -------------------------------------------------------------------------------- Patient/Caregiver Education Details Patient Name: Megan Carpenter Date of Service: 05/16/2017 1:45 PM Medical Record Number: [id number] Patient Account Number: [id number] Date of Birth/Gender: 22-Mar-1929 (84 y.o. Female) Treating RN: Renne Crigler Primary Care Physician: Jonna Clark, Fayrene Fearing Other Clinician: Referring Physician: Jonna Clark, Fayrene Fearing Treating Physician/Extender: Skeet Simmer in Treatment: 1 Education Assessment Education Provided To: Patient Education Topics Provided Wound Debridement: Handouts: Wound Debridement Methods: Explain/Verbal Responses: State content correctly Wound/Skin Impairment: Handouts: Caring for Your Ulcer Methods: Explain/Verbal Responses: State content correctly Electronic SignatureCarpenters) Signed: 05/16/2017 5:07:18 PM By: Renne Crigler Entered By: Renne Crigler on 05/16/2017 14:30:03 Megan [id number]) -------------------------------------------------------------------------------- Wound Assessment Details Patient Name: Megan Carpenter. Date of  Service: 05/16/2017 1:45 PM Medical Record Number: [id number] Patient Account Number: [id number] Date of Birth/Sex: Sep 24, 1928 (84 y.o. Female)  Treating RN: Renne Crigler Primary Care Angles Trevizo: Jonna Clark, Fayrene Fearing Other Clinician: Referring Dreshaun Stene: Jonna Clark, Fayrene Fearing Treating Janitza Revuelta/Extender: Linwood Dibbles, HOYT Weeks in Treatment: 1 Wound Status Wound Number: 1 Primary Etiology: Trauma, Other Wound Location: Right Ankle - Lateral Wound Status: Open Wounding Event: Trauma Comorbid History: Cataracts, Hypertension, Dementia Date Acquired: 04/25/2017 Weeks Of Treatment: 1 Clustered Wound: No Photos Photo Uploaded By: Renne Crigler on 05/16/2017 17:18:17 Wound Measurements Length: (cm) 0.4 Width: (cm) 0.6 Depth: (cm) 0.2 Area: (cm) 0.188 Volume: (cm) 0.038 % Reduction in Area: 31.6% % Reduction in Volume: -40.7% Epithelialization: None Tunneling: No Undermining: No Wound Description Full Thickness Without Exposed Support Classification: Structures Wound Margin: Flat and Intact Exudate Medium Amount: Exudate Type: Serous Exudate Color: amber Foul Odor After Cleansing: No Slough/Fibrino Yes Wound Bed Granulation Amount: None Present (0%) Exposed Structure Necrotic Amount: Large (67-100%) Fascia Exposed: No Necrotic Quality: Eschar, Adherent Slough Fat Layer (Subcutaneous Tissue) Exposed: Yes Tendon Exposed: No Muscle Exposed: No Joint Exposed: No Bone Exposed: No Souders, Cyrena J. ([id number]) Periwound Skin Texture Texture Color No Abnormalities Noted: No No Abnormalities Noted: No Callus: No Atrophie Blanche: No Crepitus: No Cyanosis: No Excoriation: No Ecchymosis: No Induration: No Erythema: No Rash: No Hemosiderin Staining: No Scarring: No Mottled: No Pallor: No Moisture Rubor: Yes No Abnormalities Noted: No Dry / Scaly: No Maceration: No Wound Preparation Ulcer Cleansing: Rinsed/Irrigated with Saline Topical Anesthetic Applied: Other:  lidocaine 4%, Electronic SignatureCarpenters) Signed: 05/16/2017 5:07:18 PM By: Renne Crigler Entered By: Renne Crigler on 05/16/2017 14:21:56 Megan [id number]) -------------------------------------------------------------------------------- Vitals Details Patient Name: Megan Carpenter Date of Service: 05/16/2017 1:45 PM Medical Record Number: [id number] Patient Account Number: [id number] Date of Birth/Sex: [birth year] (84 y.o. Female) Treating RN: Renne Crigler Primary Care Eagle Pitta: Jonna Clark, Fayrene Fearing Other Clinician: Referring [place] Churchill: Jonna Clark, Fayrene Fearing Treating Lakiyah Arntson/Extender: Linwood Dibbles, HOYT Weeks in Treatment: 1 Vital Signs Time Taken: 02:17 Temperature (F): 98.0 Height (in): 60 Pulse (bpm): 82 Weight (lbs): 88.3 Respiratory Rate (breaths/min): 16 Body Mass Index (BMI): 17.2 Blood Pressure (mmHg): 141/68 Reference Range: 80 - 120 mg / dl Electronic SignatureCarpenters) Signed: 05/16/2017 5:07:18 PM By: Renne Crigler Entered By: Renne Crigler on 05/16/2017 14:17:37

## 2017-05-18 NOTE — Progress Notes (Addendum)
ANNJANETTE, WERTENBERGER ([id number]) Visit Report for 05/16/2017 Chief Complaint Document Details Patient Name: Megan Carpenter, Megan Carpenter. Date of Service: 05/16/2017 1:45 PM Medical Record Number: [id number] Patient Account Number: [id number] Date of Birth/Sex: [birth year] (84 y.o. Female) Treating RN: Renne Crigler Primary Care Provider: Jonna Clark, Fayrene Fearing Other Clinician: Referring Provider: Jonna Clark, Fayrene Fearing Treating Provider/Extender: Linwood Dibbles, HOYT Weeks in Treatment: 1 Information Obtained from: Patient Chief Complaint Right ankle ulcer Electronic Signature(s) Signed: 07/02/2017 3:36:20 AM Megan Carpenter: Lenda Kelp PA-C Previous Signature: 05/16/2017 4:48:53 PM Version Megan Carpenter: Lenda Kelp PA-C Entered Megan Carpenter: Lenda Kelp on 07/02/2017 02:37:12 Megan Carpenter ([id number]) -------------------------------------------------------------------------------- Debridement Details Patient Name: Megan Carpenter Date of Service: 05/16/2017 1:45 PM Medical Record Number: [id number] Patient Account Number: [id number] Date of Birth/Sex: 04/20/1928 (84 y.o. Female) Treating RN: Renne Crigler Primary Care Provider: Jonna Clark, Fayrene Fearing Other Clinician: Referring Provider: Jonna Clark, Fayrene Fearing Treating Provider/Extender: Linwood Dibbles, HOYT Weeks in Treatment: 1 Debridement Performed for Wound #1 Right,Lateral Ankle Assessment: Performed Megan Carpenter: Physician STONE III, HOYT E., PA-C Debridement: Debridement Pre-procedure Verification/Time Yes - 02:27 Out Taken: Start Time: 02:27 Pain Control: Other : lidocaine 4% Level: Skin/Subcutaneous Tissue Total Area Debrided (L x W): 0.4 (cm) x 0.6 (cm) = 0.24 (cm) Tissue and other material Viable, Non-Viable, Fibrin/Slough, Skin, Subcutaneous debrided: Instrument: Curette Bleeding: Minimum Hemostasis Achieved: Pressure End Time: 02:28 Procedural Pain: 0 Post Procedural Pain: 0 Response to Treatment: Procedure was tolerated well Post Debridement Measurements of Total  Wound Length: (cm) 0.4 Width: (cm) 0.6 Depth: (cm) 0.3 Volume: (cm) 0.057 Character of Wound/Ulcer Post Debridement: Stable Post Procedure Diagnosis Same as Pre-procedure Electronic Signature(s) Signed: 05/16/2017 4:48:53 PM Megan Carpenter: Lenda Kelp PA-C Signed: 05/16/2017 5:07:18 PM Megan Carpenter: Renne Crigler Entered Megan Carpenter: Renne Crigler on 05/16/2017 14:28:15 Megan Carpenter ([id number]) -------------------------------------------------------------------------------- HPI Details Patient Name: Megan Carpenter Date of Service: 05/16/2017 1:45 PM Medical Record Number: [id number] Patient Account Number: [id number] Date of Birth/Sex: 02-09-1929 (84 y.o. Female) Treating RN: Renne Crigler Primary Care Provider: Jonna Clark, Fayrene Fearing Other Clinician: Referring Provider: Jonna Clark, Fayrene Fearing Treating Provider/Extender: Linwood Dibbles, HOYT Weeks in Treatment: 1 History of Present Illness Associated Signs and Symptoms: Patient has a history of dementia, moderate protein calorie malnutrition, hypertension, and prediabetes. HPI Description: 05/09/17 on evaluation today patient appears to be doing decently well in regard to her right ankle wound although we are seeing her for initial evaluation. I did see the photograph that her daughter had and showed me today which seems to indicate that the wound was definitely much worse and her foot much more swollen prior to the initiation of antibiotics when she was seen at [facility] on 05/01/17. Subsequently she was placed on Bactrim DS along with Keflex. They did x-ray her ankle and there did not appear to be any fracture or abnormality based on the x-ray noted in the chart which I did review. The x-ray the right ankle should osteoarthritis but no acute fracture. Initially they diagnosed her with a sprain of the ankle but then subsequently were more concerned about the possibility of cellulitis of her foot it was then recommended that she follow up with wound care which  is where she comes into me today. This injury occurred last Thursday which was on 05/01/17. There are unsure of exactly what might have precipitated this event although she does have a history of having scratches from her notable cats that she has in her home her daughter believes this might have been the factor involved. No fevers,  chills, nausea, or vomiting noted at this time. 05/16/17 on evaluation today patient appears to be doing well in regard to her right lateral ankle wound. She has been tolerating the dressing changes without complication and there does not appear to be evidence of infection which is great news. Overall I do think she is showing signs of heading in the right direction as far as healing this wound is concerned. Patient is seen with her daughter today who has been helping to monitor this wound at home. Electronic Signature(s) Signed: 07/02/2017 3:36:20 AM Megan Carpenter: Lenda Kelp PA-C Previous Signature: 05/16/2017 4:48:53 PM Version Megan Carpenter: Lenda Kelp PA-C Entered Megan Carpenter: Lenda Kelp on 07/02/2017 02:38:09 Megan Carpenter ([id number]) -------------------------------------------------------------------------------- Physical Exam Details Patient Name: Megan Carpenter Date of Service: 05/16/2017 1:45 PM Medical Record Number: [id number] Patient Account Number: [id number] Date of Birth/Sex: 05-15-1928 (84 y.o. Female) Treating RN: Renne Crigler Primary Care Provider: Jonna Clark, Fayrene Fearing Other Clinician: Referring Provider: Jonna Clark, Fayrene Fearing Treating Provider/Extender: Linwood Dibbles, HOYT Weeks in Treatment: 1 Constitutional Thin and well-hydrated in no acute distress. Respiratory normal breathing without difficulty. Psychiatric this patient is able to make decisions and demonstrates good insight into disease process. Alert and Oriented x 3. pleasant and cooperative. Notes Patient does have some Slough noted in the wound bed but fortunately there does not appear to be any  evidence of significant infection in fact her erythema surrounding the wound bed itself appears to be greatly improved. Electronic Signature(s) Signed: 05/16/2017 4:48:53 PM Megan Carpenter: Lenda Kelp PA-C Entered Megan Carpenter: Lenda Kelp on 05/16/2017 16:39:38 Megan Carpenter ([id number]) -------------------------------------------------------------------------------- Physician Orders Details Patient Name: Megan Carpenter Date of Service: 05/16/2017 1:45 PM Medical Record Number: [id number] Patient Account Number: [id number] Date of Birth/Sex: [birth year] (84 y.o. Female) Treating RN: Renne Crigler Primary Care Provider: Jonna Clark, Fayrene Fearing Other Clinician: Referring Provider: Jonna Clark, Fayrene Fearing Treating Provider/Extender: Linwood Dibbles, HOYT Weeks in Treatment: 1 Verbal / Phone Orders: No Diagnosis Coding ICD-10 Coding Code Description S91.301A Unspecified open wound, right foot, initial encounter L97.512 Non-pressure chronic ulcer of other part of right foot with fat layer exposed F03.90 Unspecified dementia without behavioral disturbance E44.0 Moderate protein-calorie malnutrition I10 Essential (primary) hypertension R73.03 Prediabetes Wound Cleansing Wound #1 Right,Lateral Ankle o Clean wound with Normal Saline. o Cleanse wound with mild soap and water o May Shower, gently pat wound dry prior to applying new dressing. Anesthetic (add to Medication List) Wound #1 Right,Lateral Ankle o Topical Lidocaine 4% cream applied to wound bed prior to debridement (In Clinic Only). Skin Barriers/Peri-Wound Care Wound #1 Right,Lateral Ankle o Skin Prep Primary Wound Dressing Wound #1 Right,Lateral Ankle o Silvercel Non-Adherent - rope pack lightly in wound Secondary Dressing Wound #1 Right,Lateral Ankle o Boardered Foam Dressing Dressing Change Frequency Wound #1 Right,Lateral Ankle o Change dressing every other day. Follow-up Appointments Wound #1 Right,Lateral Ankle o Return  Appointment in 1 week. Additional Orders / Instructions AVIKA, CARBINE ([id number]) Wound #1 Right,Lateral Ankle o Vitamin A; Vitamin C, Zinc o Increase protein intake. Home Health Wound #1 Right,Lateral Ankle o Initiate Home Health for Skilled Nursing o Home Health Nurse may visit PRN to address patientos wound care needs. o FACE TO FACE ENCOUNTER: MEDICARE and MEDICAID PATIENTS: I certify that this patient is under my care and that I had a face-to-face encounter that meets the physician face-to-face encounter requirements with this patient on this date. The encounter with the patient was in whole or in part for the following MEDICAL  CONDITION: (primary reason for Home Healthcare) MEDICAL NECESSITY: I certify, that based on my findings, NURSING services are a medically necessary home health service. HOME BOUND STATUS: I certify that my clinical findings support that this patient is homebound (i.e., Due to illness or injury, pt requires aid of supportive devices such as crutches, cane, wheelchairs, walkers, the use of special transportation or the assistance of another person to leave their place of residence. There is a normal inability to leave the home and doing so requires considerable and taxing effort. Other absences are for medical reasons / religious services and are infrequent or of short duration when for other reasons). o If current dressing causes regression in wound condition, may D/C ordered dressing product/s and apply Normal Saline Moist Dressing daily until next Wound Healing Center / Other MD appointment. Notify Wound Healing Center of regression in wound condition at [phone]. o Please direct any NON-WOUND related issues/requests for orders to patient's Primary Care Physician Electronic Signature(s) Signed: 05/16/2017 4:48:53 PM Megan Carpenter: Lenda Kelp PA-C Signed: 05/16/2017 5:07:18 PM Megan Carpenter: Renne Crigler Entered Megan Carpenter: Renne Crigler on 05/16/2017  14:28:55 Megan Carpenter ([id number]) -------------------------------------------------------------------------------- Problem List Details Patient Name: Megan Carpenter Date of Service: 05/16/2017 1:45 PM Medical Record Number: [id number] Patient Account Number: [id number] Date of Birth/Sex: 06-02-1928 (84 y.o. Female) Treating RN: Renne Crigler Primary Care Provider: Jonna Clark, Fayrene Fearing Other Clinician: Referring Provider: Jonna Clark, Fayrene Fearing Treating Provider/Extender: Linwood Dibbles, HOYT Weeks in Treatment: 1 Active Problems ICD-10 Encounter Code Description Active Date Diagnosis S91.001A Unspecified open wound, right ankle, initial encounter 06/03/2017 Yes L97.312 Non-pressure chronic ulcer of right ankle with fat layer exposed 06/03/2017 Yes F03.90 Unspecified dementia without behavioral disturbance 05/09/2017 Yes E44.0 Moderate protein-calorie malnutrition 05/09/2017 Yes I10 Essential (primary) hypertension 05/09/2017 Yes R73.03 Prediabetes 05/09/2017 Yes Inactive Problems Resolved Problems Electronic Signature(s) Signed: 06/03/2017 10:42:09 AM Megan Carpenter: Lenda Kelp PA-C Previous Signature: 05/16/2017 4:48:53 PM Version Megan Carpenter: Lenda Kelp PA-C Entered Megan Carpenter: Lenda Kelp on 06/03/2017 09:31:22 Megan Carpenter ([id number]) -------------------------------------------------------------------------------- Progress Note Details Patient Name: Megan Carpenter Date of Service: 05/16/2017 1:45 PM Medical Record Number: [id number] Patient Account Number: [id number] Date of Birth/Sex: [birth year] (84 y.o. Female) Treating RN: Renne Crigler Primary Care Provider: Jonna Clark, Fayrene Fearing Other Clinician: Referring Provider: Jonna Clark, Fayrene Fearing Treating Provider/Extender: Linwood Dibbles, HOYT Weeks in Treatment: 1 Subjective Chief Complaint Information obtained from Patient Right ankle ulcer History of Present Illness (HPI) The following HPI elements were documented for the patient's wound: Associated  Signs and Symptoms: Patient has a history of dementia, moderate protein calorie malnutrition, hypertension, and prediabetes. 05/09/17 on evaluation today patient appears to be doing decently well in regard to her right ankle wound although we are seeing her for initial evaluation. I did see the photograph that her daughter had and showed me today which seems to indicate that the wound was definitely much worse and her foot much more swollen prior to the initiation of antibiotics when she was seen at [facility] on 05/01/17. Subsequently she was placed on Bactrim DS along with Keflex. They did x-ray her ankle and there did not appear to be any fracture or abnormality based on the x-ray noted in the chart which I did review. The x-ray the right ankle should osteoarthritis but no acute fracture. Initially they diagnosed her with a sprain of the ankle but then subsequently were more concerned about the possibility of cellulitis of her foot it was then recommended that she follow up with wound care  which is where she comes into me today. This injury occurred last Thursday which was on 05/01/17. There are unsure of exactly what might have precipitated this event although she does have a history of having scratches from her notable cats that she has in her home her daughter believes this might have been the factor involved. No fevers, chills, nausea, or vomiting noted at this time. 05/16/17 on evaluation today patient appears to be doing well in regard to her right lateral ankle wound. She has been tolerating the dressing changes without complication and there does not appear to be evidence of infection which is great news. Overall I do think she is showing signs of heading in the right direction as far as healing this wound is concerned. Patient is seen with her daughter today who has been helping to monitor this wound at home. Patient History Information obtained from Patient. Family History Cancer -  Father, Heart Disease - Paternal Grandparents, Lung Disease - Father, No family history of Diabetes, Hypertension, Kidney Disease, Seizures, Stroke, Thyroid Problems, Tuberculosis. Social History Never smoker, Marital Status - Widowed, Alcohol Use - Never, Drug Use - No History, Caffeine Use - Daily. Review of Systems (ROS) Constitutional Symptoms (General Health) Denies complaints or symptoms of Fever, Chills. Respiratory The patient has no complaints or symptoms. Cardiovascular The patient has no complaints or symptoms. Psychiatric The patient has no complaints or symptoms. Megan Carpenter, Megan Carpenter ([id number]) Objective Constitutional Thin and well-hydrated in no acute distress. Vitals Time Taken: 2:17 AM, Height: 60 in, Weight: 88.3 lbs, BMI: 17.2, Temperature: 98.0 F, Pulse: 82 bpm, Respiratory Rate: 16 breaths/min, Blood Pressure: 141/68 mmHg. Respiratory normal breathing without difficulty. Psychiatric this patient is able to make decisions and demonstrates good insight into disease process. Alert and Oriented x 3. pleasant and cooperative. General Notes: Patient does have some Slough noted in the wound bed but fortunately there does not appear to be any evidence of significant infection in fact her erythema surrounding the wound bed itself appears to be greatly improved. Integumentary (Hair, Skin) Wound #1 status is Open. Original cause of wound was Trauma. The wound is located on the Right,Lateral Ankle. The wound measures 0.4cm length x 0.6cm width x 0.2cm depth; 0.188cm^2 area and 0.038cm^3 volume. There is Fat Layer (Subcutaneous Tissue) Exposed exposed. There is no tunneling or undermining noted. There is a medium amount of serous drainage noted. The wound margin is flat and intact. There is no granulation within the wound bed. There is a large (67-100%) amount of necrotic tissue within the wound bed including Eschar and Adherent Slough. The periwound skin appearance exhibited:  Rubor. The periwound skin appearance did not exhibit: Callus, Crepitus, Excoriation, Induration, Rash, Scarring, Dry/Scaly, Maceration, Atrophie Blanche, Cyanosis, Ecchymosis, Hemosiderin Staining, Mottled, Pallor, Erythema. Assessment Active Problems ICD-10 S91.001A - Unspecified open wound, right ankle, initial encounter L97.312 - Non-pressure chronic ulcer of right ankle with fat layer exposed F03.90 - Unspecified dementia without behavioral disturbance E44.0 - Moderate protein-calorie malnutrition I10 - Essential (primary) hypertension R73.03 - Prediabetes Gaunce, Narmeen J. ([id number]) Procedures Wound #1 Pre-procedure diagnosis of Wound #1 is a Trauma, Other located on the Right,Lateral Ankle . There was a Skin/Subcutaneous Tissue Debridement ([id number]) debridement with total area of 0.24 sq cm performed Megan Carpenter STONE III, HOYT E., PA-C. with the following instrument(s): Curette to remove Viable and Non-Viable tissue/material including Fibrin/Slough, Skin, and Subcutaneous after achieving pain control using Other (lidocaine 4%). A time out was conducted at 02:27, prior to the start of the  procedure. A Minimum amount of bleeding was controlled with Pressure. The procedure was tolerated well with a pain level of 0 throughout and a pain level of 0 following the procedure. Post Debridement Measurements: 0.4cm length x 0.6cm width x 0.3cm depth; 0.057cm^3 volume. Character of Wound/Ulcer Post Debridement is stable. Post procedure Diagnosis Wound #1: Same as Pre-Procedure Plan Wound Cleansing: Wound #1 Right,Lateral Ankle: Clean wound with Normal Saline. Cleanse wound with mild soap and water May Shower, gently pat wound dry prior to applying new dressing. Anesthetic (add to Medication List): Wound #1 Right,Lateral Ankle: Topical Lidocaine 4% cream applied to wound bed prior to debridement (In Clinic Only). Skin Barriers/Peri-Wound Care: Wound #1 Right,Lateral Ankle: Skin Prep Primary  Wound Dressing: Wound #1 Right,Lateral Ankle: Silvercel Non-Adherent - rope pack lightly in wound Secondary Dressing: Wound #1 Right,Lateral Ankle: Boardered Foam Dressing Dressing Change Frequency: Wound #1 Right,Lateral Ankle: Change dressing every other day. Follow-up Appointments: Wound #1 Right,Lateral Ankle: Return Appointment in 1 week. Additional Orders / Instructions: Wound #1 Right,Lateral Ankle: Vitamin A; Vitamin C, Zinc Increase protein intake. Home Health: Wound #1 Right,Lateral Ankle: Initiate Home Health for Skilled Nursing Home Health Nurse may visit PRN to address patient s wound care needs. FACE TO FACE ENCOUNTER: MEDICARE and MEDICAID PATIENTS: I certify that this patient is under my care and that I had a face-to-face encounter that meets the physician face-to-face encounter requirements with this patient on this date. The encounter with the patient was in whole or in part for the following MEDICAL CONDITION: (primary reason for Home Healthcare) MEDICAL NECESSITY: I certify, that based on my findings, NURSING services are a medically necessary home health service. HOME BOUND STATUS: I certify that my clinical findings support that this patient is homebound (i.e., Due to illness or injury, pt requires aid of supportive devices such as crutches, cane, wheelchairs, walkers, the use of special Megan Carpenter, Megan J. ([id number]) transportation or the assistance of another person to leave their place of residence. There is a normal inability to leave the home and doing so requires considerable and taxing effort. Other absences are for medical reasons / religious services and are infrequent or of short duration when for other reasons). If current dressing causes regression in wound condition, may D/C ordered dressing product/s and apply Normal Saline Moist Dressing daily until next Wound Healing Center / Other MD appointment. Notify Wound Healing Center of regression in wound  condition at [phone]. Please direct any NON-WOUND related issues/requests for orders to patient's Primary Care Physician Point I'm going to recommend that we continue with the Current wound care measures that she seems to be making good progress. Especially post debridement the wound appear to be doing well. We will see were things stand in one weeks time. Please see above for specific wound care orders. We will see patient for re-evaluation in 1 week(s) here in the clinic. If anything worsens or changes patient will contact our office for additional recommendations. Electronic Signature(s) Signed: 07/02/2017 3:36:20 AM Megan Carpenter: Lenda KelpStone III, Hoyt PA-C Previous Signature: 06/03/2017 10:42:09 AM Version Megan Carpenter: Lenda KelpStone III, Hoyt PA-C Previous Signature: 05/16/2017 4:48:53 PM Version Megan Carpenter: Lenda KelpStone III, Hoyt PA-C Entered Megan Carpenter: Lenda KelpStone III, Hoyt on 07/02/2017 02:39:34 Megan Carpenter, Megan J. ([id number]) -------------------------------------------------------------------------------- ROS/PFSH Details Patient Name: Megan Carpenter, Megan J. Date of Service: 05/16/2017 1:45 PM Medical Record Number: [id number] Patient Account Number: [id number] Date of Birth/Sex: 1929-01-27 (84 y.o. Female) Treating RN: Renne CriglerFlinchum, Cheryl Primary Care Provider: Jonna ClarkHawkins Jr, Fayrene FearingJames Other Clinician: Referring Provider: Jonna ClarkHawkins Jr, Fayrene FearingJames Treating Provider/Extender:  STONE III, HOYT Weeks in Treatment: 1 Information Obtained From Patient Wound History Do you currently have one or more open woundso Yes How many open wounds do you currently haveo 1 Approximately how long have you had your woundso 2 weeks How have you been treating your wound(s) until nowo bandaid Has your wound(s) ever healed and then re-openedo No Have you had any lab work done in the past montho No Have you tested positive for an antibiotic resistant organism (MRSA, VRE)o No Have you tested positive for osteomyelitis (bone infection)o No Have you had any tests for circulation on your  legso No Have you had other problems associated with your woundso Infection Constitutional Symptoms (General Health) Complaints and Symptoms: Negative for: Fever; Chills Eyes Medical History: Positive for: Cataracts - surgery bilateral Negative for: Glaucoma; Optic Neuritis Ear/Nose/Mouth/Throat Medical History: Negative for: Chronic sinus problems/congestion; Middle ear problems Hematologic/Lymphatic Medical History: Negative for: Anemia; Hemophilia; Human Immunodeficiency Virus; Lymphedema; Sickle Cell Disease Respiratory Complaints and Symptoms: No Complaints or Symptoms Medical History: Negative for: Aspiration; Asthma; Chronic Obstructive Pulmonary Disease (COPD); Pneumothorax; Sleep Apnea; Tuberculosis Cardiovascular Complaints and Symptoms: No Complaints or Symptoms Medical HistoryDORISMAR, Megan Carpenter ([id number]) Positive for: Hypertension Negative for: Angina; Arrhythmia; Congestive Heart Failure; Coronary Artery Disease; Deep Vein Thrombosis; Hypotension; Myocardial Infarction; Peripheral Arterial Disease; Peripheral Venous Disease; Phlebitis; Vasculitis Gastrointestinal Medical History: Negative for: Cirrhosis ; Colitis; Crohnos; Hepatitis A; Hepatitis B; Hepatitis C Endocrine Medical History: Negative for: Type I Diabetes; Type II Diabetes Genitourinary Medical History: Negative for: End Stage Renal Disease Immunological Medical History: Negative for: Lupus Erythematosus; Raynaudos; Scleroderma Integumentary (Skin) Medical History: Negative for: History of Burn; History of pressure wounds Musculoskeletal Medical History: Negative for: Gout; Rheumatoid Arthritis; Osteoarthritis; Osteomyelitis Neurologic Medical History: Positive for: Dementia Negative for: Neuropathy; Quadriplegia; Paraplegia; Seizure Disorder Oncologic Medical History: Negative for: Received Chemotherapy; Received Radiation Psychiatric Complaints and Symptoms: No Complaints or  Symptoms Medical History: Negative for: Anorexia/bulimia; Confinement Anxiety HBO Extended History Items Eyes: Cataracts Immunizations Pneumococcal Vaccine: Megan Carpenter, Megan Carpenter ([id number]) Received Pneumococcal Vaccination: Yes Implantable Devices Family and Social History Cancer: Yes - Father; Diabetes: No; Heart Disease: Yes - Paternal Grandparents; Hypertension: No; Kidney Disease: No; Lung Disease: Yes - Father; Seizures: No; Stroke: No; Thyroid Problems: No; Tuberculosis: No; Never smoker; Marital Status - Widowed; Alcohol Use: Never; Drug Use: No History; Caffeine Use: Daily; Advanced Directives: No; Patient does not want information on Advanced Directives; Do not resuscitate: No; Living Will: No; Medical Power of Attorney: No Physician Affirmation I have reviewed and agree with the above information. Electronic Signature(s) Signed: 05/16/2017 4:48:53 PM Megan Carpenter: Lenda Kelp PA-C Signed: 05/16/2017 5:07:18 PM Megan Carpenter: Renne Crigler Entered Megan Carpenter: Lenda Kelp on 05/16/2017 16:39:14 Megan Carpenter, Megan Carpenter ([id number]) -------------------------------------------------------------------------------- SuperBill Details Patient Name: Megan Carpenter Date of Service: 05/16/2017 Medical Record Number: [id number] Patient Account Number: [id number] Date of Birth/Sex: Jun 17, 1928 (84 y.o. Female) Treating RN: Renne Crigler Primary Care Provider: Jonna Clark, Fayrene Fearing Other Clinician: Referring Provider: Jonna Clark, Fayrene Fearing Treating Provider/Extender: Linwood Dibbles, HOYT Weeks in Treatment: 1 Diagnosis Coding ICD-10 Codes Code Description S91.001A Unspecified open wound, right ankle, initial encounter L97.312 Non-pressure chronic ulcer of right ankle with fat layer exposed F03.90 Unspecified dementia without behavioral disturbance E44.0 Moderate protein-calorie malnutrition I10 Essential (primary) hypertension R73.03 Prediabetes Facility Procedures CPT4 Code: [id number] Description: 11042 - DEB SUBQ TISSUE  20 SQ CM/< ICD-10 Diagnosis Description L97.312 Non-pressure chronic ulcer of right ankle with fat layer ex Modifier: posed Quantity: 1 Physician Procedures CPT4 Code:  5621308 Description: 11042 - WC PHYS SUBQ TISS 20 SQ CM ICD-10 Diagnosis Description L97.312 Non-pressure chronic ulcer of right ankle with fat layer ex Modifier: posed Quantity: 1 Electronic Signature(s) Signed: 06/03/2017 10:42:09 AM Megan Carpenter: Lenda Kelp PA-C Previous Signature: 05/16/2017 4:48:53 PM Version Megan Carpenter: Lenda Kelp PA-C Entered Megan Carpenter: Lenda Kelp on 06/03/2017 09:32:26

## 2017-05-23 ENCOUNTER — Encounter: Payer: Medicare Other | Admitting: Physician Assistant

## 2017-05-23 DIAGNOSIS — L97312 Non-pressure chronic ulcer of right ankle with fat layer exposed: Secondary | ICD-10-CM | POA: Diagnosis not present

## 2017-05-26 NOTE — Progress Notes (Signed)
Megan RedderDOBY, Megan J. ([id number]) Visit Report for 05/23/2017 Arrival Information Details Patient Name: Megan RedderDOBY, Megan J. Date of Service: 05/23/2017 12:30 PM Medical Record Number: [id number] Patient Account Number: [id number] Date of Birth/Sex: 15-Mar-1929 (84 y.o. Female) Treating RN: Renne CriglerFlinchum, Cheryl Primary Care Zareth Rippetoe: Jonna ClarkHawkins Jr, Fayrene FearingJames Other Clinician: Referring Samiel Peel: Jonna ClarkHawkins Jr, Fayrene FearingJames Treating Aldred Mase/Extender: Linwood DibblesSTONE III, HOYT Weeks in Treatment: 2 Visit Information History Since Last Visit All ordered tests and consults were completed: No Patient Arrived: Ambulatory Added or deleted any medications: No Arrival Time: 12:38 Any new allergies or adverse reactions: No Accompanied By: friend Had a fall or experienced change in No Transfer Assistance: None activities of daily living that may affect Patient Identification Verified: Yes risk of falls: Secondary Verification Process Completed: Yes Signs or symptoms of abuse/neglect since last visito No Patient Has Alerts: Yes Hospitalized since last visit: No Pain Present Now: No Electronic Signature(s) Signed: 05/23/2017 4:49:48 PM By: Renne CriglerFlinchum, Cheryl Entered By: Renne CriglerFlinchum, Cheryl on 05/23/2017 12:39:13 Megan Carpenter, Megan J. ([id number]) -------------------------------------------------------------------------------- Encounter Discharge Information Details Patient Name: Megan Carpenter, Megan J. Date of Service: 05/23/2017 12:30 PM Medical Record Number: [id number] Patient Account Number: [id number] Date of Birth/Sex: 15-Mar-1929 (84 y.o. Female) Treating RN: Renne CriglerFlinchum, Cheryl Primary Care Carnita Golob: Jonna ClarkHawkins Jr, Fayrene FearingJames Other Clinician: Referring Tomoya Ringwald: Jonna ClarkHawkins Jr, Fayrene FearingJames Treating Milady Fleener/Extender: Linwood DibblesSTONE III, HOYT Weeks in Treatment: 2 Encounter Discharge Information Items Discharge Pain Level: 0 Discharge Condition: Stable Ambulatory Status: Ambulatory Discharge Destination: Home Private Transportation: Auto Accompanied By:  friend Schedule Follow-up Appointment: Yes Medication Reconciliation completed and provided No to Patient/Care Ryen Rhames: Clinical Summary of Care: Provided Form Type Recipient Paper Patient bd Electronic Signature(s) Signed: 05/23/2017 4:49:48 PM By: Renne CriglerFlinchum, Cheryl Previous Signature: 05/23/2017 1:04:50 PM Version By: Dayton MartesWallace, RCP,RRT,CHT, Sallie RCP, RRT, CHT Entered By: Renne CriglerFlinchum, Cheryl on 05/23/2017 13:05:21 Megan Carpenter, Megan J. ([id number]) -------------------------------------------------------------------------------- Lower Extremity Assessment Details Patient Name: Megan Carpenter, Megan J. Date of Service: 05/23/2017 12:30 PM Medical Record Number: [id number] Patient Account Number: [id number] Date of Birth/Sex: 15-Mar-1929 (84 y.o. Female) Treating RN: Renne CriglerFlinchum, Cheryl Primary Care Adra Shepler: Jonna ClarkHawkins Jr, Fayrene FearingJames Other Clinician: Referring Corynn Solberg: Jonna ClarkHawkins Jr, Fayrene FearingJames Treating Christl Fessenden/Extender: Linwood DibblesSTONE III, HOYT Weeks in Treatment: 2 Edema Assessment Assessed: [Left: No] [Right: No] Edema: [Left: N] [Right: o] Vascular Assessment Claudication: Claudication Assessment [Right:None] Pulses: Dorsalis Pedis Palpable: [Right:Yes] Posterior Tibial Extremity colors, hair growth, and conditions: Extremity Color: [Right:Normal] Hair Growth on Extremity: [Right:No] Temperature of Extremity: [Right:Cool] Capillary Refill: [Right:< 3 seconds] Toe Nail Assessment Left: Right: Thick: Yes Discolored: Yes Deformed: Yes Improper Length and Hygiene: Yes Electronic Signature(s) Signed: 05/23/2017 4:49:48 PM By: Renne CriglerFlinchum, Cheryl Entered By: Renne CriglerFlinchum, Cheryl on 05/23/2017 12:45:28 Slinger, Megan LullBARBARA J. ([id number]) -------------------------------------------------------------------------------- Multi Wound Chart Details Patient Name: Megan Carpenter, Megan J. Date of Service: 05/23/2017 12:30 PM Medical Record Number: [id number] Patient Account Number: [id number] Date of Birth/Sex: 15-Mar-1929 (84 y.o. Female) Treating  RN: Renne CriglerFlinchum, Cheryl Primary Care Kamry Faraci: Jonna ClarkHawkins Jr, Fayrene FearingJames Other Clinician: Referring Kinzley Savell: Jonna ClarkHawkins Jr, Fayrene FearingJames Treating Jeriko Kowalke/Extender: Linwood DibblesSTONE III, HOYT Weeks in Treatment: 2 Vital Signs Height(in): 60 Pulse(bpm): 80 Weight(lbs): 88.3 Blood Pressure(mmHg): 138/50 Body Mass Index(BMI): 17 Temperature(F): 98.0 Respiratory Rate 16 (breaths/min): Photos: [1:No Photos] [N/A:N/A] Wound Location: [1:Right Ankle - Lateral] [N/A:N/A] Wounding Event: [1:Trauma] [N/A:N/A] Primary Etiology: [1:Trauma, Other] [N/A:N/A] Comorbid History: [1:Cataracts, Hypertension, Dementia] [N/A:N/A] Date Acquired: [1:04/25/2017] [N/A:N/A] Weeks of Treatment: [1:2] [N/A:N/A] Wound Status: [1:Open] [N/A:N/A] Measurements L x W x D [1:0.2x0.3x0.3] [N/A:N/A] (cm) Area (cm) : [1:0.047] [N/A:N/A] Volume (cm) : [1:0.014] [N/A:N/A] % Reduction in Area: [1:82.90%] [N/A:N/A] % Reduction in Volume: [1:48.10%] [N/A:N/A] Classification: [  1:Full Thickness Without Exposed Support Structures] [N/A:N/A] Exudate Amount: [1:Medium] [N/A:N/A] Exudate Type: [1:Serous] [N/A:N/A] Exudate Color: [1:amber] [N/A:N/A] Wound Margin: [1:Flat and Intact] [N/A:N/A] Granulation Amount: [1:Medium (34-66%)] [N/A:N/A] Necrotic Amount: [1:Medium (34-66%)] [N/A:N/A] Exposed Structures: [1:Fat Layer (Subcutaneous Tissue) Exposed: Yes Fascia: No Tendon: No Muscle: No Joint: No Bone: No] [N/A:N/A] Epithelialization: [1:None] [N/A:N/A] Periwound Skin Texture: [1:Excoriation: No Induration: No Callus: No Crepitus: No Rash: No Scarring: No] [N/A:N/A] Periwound Skin Moisture: Maceration: No N/A N/A Dry/Scaly: No Periwound Skin Color: Rubor: Yes N/A N/A Atrophie Blanche: No Cyanosis: No Ecchymosis: No Erythema: No Hemosiderin Staining: No Mottled: No Pallor: No Temperature: No Abnormality N/A N/A Tenderness on Palpation: No N/A N/A Wound Preparation: Ulcer Cleansing: N/A N/A Rinsed/Irrigated with Saline Topical  Anesthetic Applied: Other: lidocaine 4% Treatment Notes Electronic Signature(s) Signed: 05/23/2017 4:49:48 PM By: Renne Crigler Entered By: Renne Crigler on 05/23/2017 12:45:40 Megan Carpenter ([id number]) -------------------------------------------------------------------------------- Multi-Disciplinary Care Plan Details Patient Name: Megan Carpenter Date of Service: 05/23/2017 12:30 PM Medical Record Number: [id number] Patient Account Number: [id number] Date of Birth/Sex: 21-Oct-1928 (84 y.o. Female) Treating RN: Renne Crigler Primary Care Deaysia Grigoryan: Jonna Clark, Fayrene Fearing Other Clinician: Referring Macen Joslin: Jonna Clark, Fayrene Fearing Treating Ola Fawver/Extender: Linwood Dibbles, HOYT Weeks in Treatment: 2 Active Inactive ` Orientation to the Wound Care Program Nursing Diagnoses: Knowledge deficit related to the wound healing center program Goals: Patient/caregiver will verbalize understanding of the Wound Healing Center Program Date Initiated: 05/09/2017 Target Resolution Date: 06/09/2017 Goal Status: Active Interventions: Provide education on orientation to the wound center Notes: ` Wound/Skin Impairment Nursing Diagnoses: Impaired tissue integrity Goals: Patient/caregiver will verbalize understanding of skin care regimen Date Initiated: 05/09/2017 Target Resolution Date: 06/09/2017 Goal Status: Active Ulcer/skin breakdown will have a volume reduction of 30% by week 4 Date Initiated: 05/09/2017 Target Resolution Date: 06/09/2017 Goal Status: Active Interventions: Assess patient/caregiver ability to obtain necessary supplies Assess patient/caregiver ability to perform ulcer/skin care regimen upon admission and as needed Assess ulceration(s) every visit Treatment Activities: Skin care regimen initiated : 05/09/2017 Notes: Electronic Signature(s) Signed: 05/23/2017 4:49:48 PM By: Shaune Spittle ([id number]) Entered By: Renne Crigler on 05/23/2017 12:45:32 Megan Carpenter ([id number]) -------------------------------------------------------------------------------- Pain Assessment Details Patient Name: Megan Carpenter Date of Service: 05/23/2017 12:30 PM Medical Record Number: [id number] Patient Account Number: [id number] Date of Birth/Sex: Sep 19, 1928 (84 y.o. Female) Treating RN: Renne Crigler Primary Care Jordi Kamm: Jonna Clark, Fayrene Fearing Other Clinician: Referring Beatriz Quintela: Jonna Clark, Fayrene Fearing Treating Eleanore Junio/Extender: Linwood Dibbles, HOYT Weeks in Treatment: 2 Active Problems Location of Pain Severity and Description of Pain Patient Has Paino No Site Locations Pain Management and Medication Current Pain Management: Electronic Signature(s) Signed: 05/23/2017 4:49:48 PM By: Renne Crigler Entered By: Renne Crigler on 05/23/2017 12:39:21 Megan Carpenter ([id number]) -------------------------------------------------------------------------------- Patient/Caregiver Education Details Patient Name: Megan Carpenter Date of Service: 05/23/2017 12:30 PM Medical Record Number: [id number] Patient Account Number: [id number] Date of Birth/Gender: Jun 15, 1928 (84 y.o. Female) Treating RN: Renne Crigler Primary Care Physician: Jonna Clark, Fayrene Fearing Other Clinician: Referring Physician: Jonna Clark, Fayrene Fearing Treating Physician/Extender: Skeet Simmer in Treatment: 2 Education Assessment Education Provided To: Patient Education Topics Provided Wound/Skin Impairment: Handouts: Caring for Your Ulcer Methods: Explain/Verbal Responses: State content correctly Electronic Signature(s) Signed: 05/23/2017 4:49:48 PM By: Renne Crigler Entered By: Renne Crigler on 05/23/2017 13:05:31 Megan Carpenter ([id number]) -------------------------------------------------------------------------------- Wound Assessment Details Patient Name: Megan Carpenter Date of Service: 05/23/2017 12:30 PM Medical Record Number: [id number] Patient Account Number: [id number] Date  of Birth/Sex: 1928-06-05 (84 y.o. Female) Treating RN:  Renne Crigler Primary Care Ina Poupard: Jonna Clark, Fayrene Fearing Other Clinician: Referring Tanaja Ganger: Jonna Clark, Fayrene Fearing Treating Percival Glasheen/Extender: Linwood Dibbles, HOYT Weeks in Treatment: 2 Wound Status Wound Number: 1 Primary Etiology: Trauma, Other Wound Location: Right Ankle - Lateral Wound Status: Open Wounding Event: Trauma Comorbid History: Cataracts, Hypertension, Dementia Date Acquired: 04/25/2017 Weeks Of Treatment: 2 Clustered Wound: No Photos Photo Uploaded By: Renne Crigler on 05/23/2017 16:38:29 Wound Measurements Length: (cm) 0.2 Width: (cm) 0.3 Depth: (cm) 0.3 Area: (cm) 0.047 Volume: (cm) 0.014 % Reduction in Area: 82.9% % Reduction in Volume: 48.1% Epithelialization: None Tunneling: Yes Position (o'clock): 10 Maximum Distance: (cm) 1.5 Undermining: No Wound Description Full Thickness Without Exposed Support Classification: Structures Wound Margin: Flat and Intact Exudate Medium Amount: Exudate Type: Serous Exudate Color: amber Foul Odor After Cleansing: No Slough/Fibrino Yes Wound Bed Granulation Amount: Medium (34-66%) Exposed Structure Necrotic Amount: Medium (34-66%) Fascia Exposed: No Necrotic Quality: Adherent Slough Fat Layer (Subcutaneous Tissue) Exposed: Yes Tendon Exposed: No Prude, Damira J. ([id number]) Muscle Exposed: No Joint Exposed: No Bone Exposed: No Periwound Skin Texture Texture Color No Abnormalities Noted: No No Abnormalities Noted: No Callus: No Atrophie Blanche: No Crepitus: No Cyanosis: No Excoriation: No Ecchymosis: No Induration: No Erythema: No Rash: No Hemosiderin Staining: No Scarring: No Mottled: No Pallor: No Moisture Rubor: Yes No Abnormalities Noted: No Dry / Scaly: No Temperature / Pain Maceration: No Temperature: No Abnormality Wound Preparation Ulcer Cleansing: Rinsed/Irrigated with Saline Topical Anesthetic Applied: Other: lidocaine  4%, Treatment Notes Wound #1 (Right, Lateral Ankle) 1. Cleansed with: Clean wound with Normal Saline 2. Anesthetic Topical Lidocaine 4% cream to wound bed prior to debridement 4. Dressing Applied: Iodoform packing Gauze 5. Secondary Dressing Applied Bordered Foam Dressing Electronic Signature(s) Signed: 05/23/2017 4:49:48 PM By: Renne Crigler Entered By: Renne Crigler on 05/23/2017 12:54:52 Megan Carpenter ([id number]) -------------------------------------------------------------------------------- Vitals Details Patient Name: Megan Carpenter Date of Service: 05/23/2017 12:30 PM Medical Record Number: [id number] Patient Account Number: [id number] Date of Birth/Sex: February 26, 1929 (84 y.o. Female) Treating RN: Renne Crigler Primary Care Navia Lindahl: Jonna Clark, Fayrene Fearing Other Clinician: Referring Theo Reither: Jonna Clark, Fayrene Fearing Treating Darene Nappi/Extender: Linwood Dibbles, HOYT Weeks in Treatment: 2 Vital Signs Time Taken: 12:39 Temperature (F): 98.0 Height (in): 60 Pulse (bpm): 80 Weight (lbs): 88.3 Respiratory Rate (breaths/min): 16 Body Mass Index (BMI): 17.2 Blood Pressure (mmHg): 138/50 Reference Range: 80 - 120 mg / dl Electronic Signature(s) Signed: 05/23/2017 4:49:48 PM By: Renne Crigler Entered By: Renne Crigler on 05/23/2017 12:41:21

## 2017-05-28 NOTE — Progress Notes (Addendum)
MERYL, HUBERS ([id number]) Visit Report for 05/23/2017 Chief Complaint Document Details Patient Name: Megan Carpenter, Megan Carpenter. Date of Service: 05/23/2017 12:30 PM Medical Record Number: [id number] Patient Account Number: [id number] Date of Birth/Sex: 1928/05/22 (84 y.o. Female) Treating RN: Renne Crigler Primary Care Provider: Jonna Clark, Fayrene Fearing Other Clinician: Referring Provider: Jonna Clark, Fayrene Fearing Treating Provider/Extender: Linwood Dibbles, Shalonda Sachse Weeks in Treatment: 2 Information Obtained from: Patient Chief Complaint Right foot ulcer Electronic Signature(s) Signed: 05/26/2017 9:06:28 AM By: Lenda Kelp PA-C Entered By: Lenda Kelp on 05/23/2017 12:52:22 Megan Carpenter ([id number]) -------------------------------------------------------------------------------- HPI Details Patient Name: Megan Carpenter Date of Service: 05/23/2017 12:30 PM Medical Record Number: [id number] Patient Account Number: [id number] Date of Birth/Sex: Jun 19, 1928 (84 y.o. Female) Treating RN: Renne Crigler Primary Care Provider: Jonna Clark, Fayrene Fearing Other Clinician: Referring Provider: Jonna Clark, Fayrene Fearing Treating Provider/Extender: Linwood Dibbles, Phillippa Straub Weeks in Treatment: 2 History of Present Illness Associated Signs and Symptoms: Patient has a history of dementia, moderate protein calorie malnutrition, hypertension, and prediabetes. HPI Description: 05/09/17 on evaluation today patient appears to be doing decently well in regard to her right ankle Megan although we are seeing her for initial evaluation. I did see the photograph that her daughter had and showed me today which seems to indicate that the Megan was definitely much worse and her foot much more swollen prior to the initiation of antibiotics when she was seen at [facility] on 05/01/17. Subsequently she was placed on Bactrim DS along with Keflex. They did x-ray her foot and there did not appear to be any fracture or abnormality based on the x-ray noted in the  chart which I did review. The x-ray the right ankle should osteoarthritis but no acute fracture. Initially they diagnosed her with a sprain of the ankle but then subsequently were more concerned about the possibility of cellulitis of her foot it was then recommended that she follow up with [facility] which is where she comes into me today. This injury occurred last Thursday which was on 05/01/17. There are unsure of exactly what might have precipitated this event although she does have a history of having scratches from her notable cats that she has in her home her daughter believes this might have been the factor involved. No fevers, chills, nausea, or vomiting noted at this time. 05/16/17 on evaluation today patient appears to be doing well in regard to her right lateral ankle Megan. She has been tolerating the dressing changes without complication and there does not appear to be evidence of infection which is great news. Overall I do think she is showing signs of heading in the right direction as far as healing this Megan is concerned. Patient is seen with her daughter today who has been helping to monitor this Megan at home. 05/23/17 on evaluation today patient appears to be doing well in regard to her right lateral ankle ulcer. She has been tolerating the dressing changes without any complication. With that being said she still is having discomfort although it's not nearly as significant and there does not appear to be any evidence of infection which is great news. No fevers, chills, nausea, or vomiting noted at this time. Electronic Signature(s) Signed: 06/03/2017 10:42:09 AM By: Lenda Kelp PA-C Previous Signature: 05/26/2017 9:06:28 AM Version By: Lenda Kelp PA-C Entered By: Lenda Kelp on 06/03/2017 09:29:37 Megan Carpenter ([id number]) -------------------------------------------------------------------------------- Physical Exam Details Patient Name: Megan Carpenter Date of  Service: 05/23/2017 12:30 PM Medical Record Number: [id number] Patient  Account Number: [id number] Date of Birth/Sex: 1928/09/30 (84 y.o. Female) Treating RN: Renne Crigler Primary Care Provider: Jonna Clark, Fayrene Fearing Other Clinician: Referring Provider: Jonna Clark, Fayrene Fearing Treating Provider/Extender: Linwood Dibbles, Rilla Buckman Weeks in Treatment: 2 Constitutional Well-nourished and well-hydrated in no acute distress. Respiratory normal breathing without difficulty. clear to auscultation bilaterally. Cardiovascular regular rate and rhythm with normal S1, S2. Psychiatric this patient is able to make decisions and demonstrates good insight into disease process. Alert and Oriented x 3. pleasant and cooperative. Notes Megan did not require any debridement today and there is a small hole with tunnel noted at 10 o'clock but other than this overall she appears well. Electronic Signature(s) Signed: 05/26/2017 9:06:28 AM By: Lenda Kelp PA-C Entered By: Lenda Kelp on 05/23/2017 17:47:09 Megan Carpenter ([id number]) -------------------------------------------------------------------------------- Physician Orders Details Patient Name: Megan Carpenter Date of Service: 05/23/2017 12:30 PM Medical Record Number: [id number] Patient Account Number: [id number] Date of Birth/Sex: [birth year] (84 y.o. Female) Treating RN: Renne Crigler Primary Care Provider: Jonna Clark, Fayrene Fearing Other Clinician: Referring Provider: Jonna Clark, Fayrene Fearing Treating Provider/Extender: Linwood Dibbles, Alfredo Spong Weeks in Treatment: 2 Verbal / Phone Orders: No Diagnosis Coding ICD-10 Coding Code Description S91.301A Unspecified open Megan, right foot, initial encounter L97.512 Non-pressure chronic ulcer of other part of right foot with fat layer exposed F03.90 Unspecified dementia without behavioral disturbance E44.0 Moderate protein-calorie malnutrition I10 Essential (primary) hypertension R73.03 Prediabetes Megan Cleansing Megan #1  Right,Lateral Ankle o Clean Megan with Normal Saline. o Cleanse Megan with mild soap and water o May Shower, gently pat Megan dry prior to applying new dressing. Anesthetic (add to Medication List) Megan #1 Right,Lateral Ankle o Topical Lidocaine 4% cream applied to Megan bed prior to debridement (In Clinic Only). Skin Barriers/Peri-Megan Care Megan #1 Right,Lateral Ankle o Skin Prep Primary Megan Dressing Megan #1 Right,Lateral Ankle o Iodoform packing Gauze - cut 1/4 inch gauze to pack into the 10 o'clock area of Megan Secondary Dressing Megan #1 Right,Lateral Ankle o Boardered Foam Dressing Dressing Change Frequency Megan #1 Right,Lateral Ankle o Change dressing every other day. Follow-up Appointments Megan #1 Right,Lateral Ankle o Return Appointment in 1 week. Additional Orders / Instructions MOYA, DUAN ([id number]) Megan #1 Right,Lateral Ankle o Vitamin A; Vitamin C, Zinc o Increase protein intake. Home Health Megan #1 Right,Lateral Ankle o Initiate Home Health for Skilled Nursing o Home Health Nurse may visit PRN to address patientos Megan care needs. o FACE TO FACE ENCOUNTER: MEDICARE and MEDICAID PATIENTS: I certify that this patient is under my care and that I had a face-to-face encounter that meets the physician face-to-face encounter requirements with this patient on this date. The encounter with the patient was in whole or in part for the following MEDICAL CONDITION: (primary reason for Home Healthcare) MEDICAL NECESSITY: I certify, that based on my findings, NURSING services are a medically necessary home health service. HOME BOUND STATUS: I certify that my clinical findings support that this patient is homebound (i.e., Due to illness or injury, pt requires aid of supportive devices such as crutches, cane, wheelchairs, walkers, the use of special transportation or the assistance of another person to leave their place of residence.  There is a normal inability to leave the home and doing so requires considerable and taxing effort. Other absences are for medical reasons / religious services and are infrequent or of short duration when for other reasons). o If current dressing causes regression in Megan condition, may D/C ordered dressing product/s and apply Normal Saline Moist  Dressing daily until next [facility] / Other MD appointment. Notify [facility] of regression in Megan condition at [phone]. o Please direct any NON-Megan related issues/requests for orders to patient's Primary Care Physician Electronic Signature(s) Signed: 05/23/2017 4:49:48 PM By: Renne Crigler Signed: 05/26/2017 9:06:28 AM By: Lenda Kelp PA-C Entered By: Renne Crigler on 05/23/2017 13:03:55 Holtzer, Megan Carpenter ([id number]) -------------------------------------------------------------------------------- Problem List Details Patient Name: Megan Carpenter Date of Service: 05/23/2017 12:30 PM Medical Record Number: [id number] Patient Account Number: [id number] Date of Birth/Sex: 13-Oct-1928 (84 y.o. Female) Treating RN: Renne Crigler Primary Care Provider: Jonna Clark, Fayrene Fearing Other Clinician: Referring Provider: Jonna Clark, Fayrene Fearing Treating Provider/Extender: Linwood Dibbles, Kristy Catoe Weeks in Treatment: 2 Active Problems ICD-10 Encounter Code Description Active Date Diagnosis S91.001A Unspecified open Megan, right ankle, initial encounter 06/03/2017 Yes L97.312 Non-pressure chronic ulcer of right ankle with fat layer exposed 06/03/2017 Yes F03.90 Unspecified dementia without behavioral disturbance 05/09/2017 Yes E44.0 Moderate protein-calorie malnutrition 05/09/2017 Yes I10 Essential (primary) hypertension 05/09/2017 Yes R73.03 Prediabetes 05/09/2017 Yes Inactive Problems Resolved Problems Electronic Signature(s) Signed: 06/03/2017 10:42:09 AM By: Lenda Kelp PA-C Previous Signature: 05/26/2017 9:06:28 AM Version By:  Lenda Kelp PA-C Entered By: Lenda Kelp on 06/03/2017 09:33:06 Megan Carpenter, Megan Carpenter ([id number]) -------------------------------------------------------------------------------- Progress Note Details Patient Name: Megan Carpenter Date of Service: 05/23/2017 12:30 PM Medical Record Number: [id number] Patient Account Number: [id number] Date of Birth/Sex: 1928/10/07 (84 y.o. Female) Treating RN: Renne Crigler Primary Care Provider: Jonna Clark, Fayrene Fearing Other Clinician: Referring Provider: Jonna Clark, Fayrene Fearing Treating Provider/Extender: Linwood Dibbles, Zykeem Bauserman Weeks in Treatment: 2 Subjective Chief Complaint Information obtained from Patient Right foot ulcer History of Present Illness (HPI) The following HPI elements were documented for the patient's Megan: Associated Signs and Symptoms: Patient has a history of dementia, moderate protein calorie malnutrition, hypertension, and prediabetes. 05/09/17 on evaluation today patient appears to be doing decently well in regard to her right ankle Megan although we are seeing her for initial evaluation. I did see the photograph that her daughter had and showed me today which seems to indicate that the Megan was definitely much worse and her foot much more swollen prior to the initiation of antibiotics when she was seen at [facility] on 05/01/17. Subsequently she was placed on Bactrim DS along with Keflex. They did x-ray her foot and there did not appear to be any fracture or abnormality based on the x-ray noted in the chart which I did review. The x-ray the right ankle should osteoarthritis but no acute fracture. Initially they diagnosed her with a sprain of the ankle but then subsequently were more concerned about the possibility of cellulitis of her foot it was then recommended that she follow up with [facility] care which is where she comes into me today. This injury occurred last Thursday which was on 05/01/17. There are unsure of exactly what might have  precipitated this event although she does have a history of having scratches from her notable cats that she has in her home her daughter believes this might have been the factor involved. No fevers, chills, nausea, or vomiting noted at this time. 05/16/17 on evaluation today patient appears to be doing well in regard to her right lateral ankle Megan. She has been tolerating the dressing changes without complication and there does not appear to be evidence of infection which is great news. Overall I do think she is showing signs of heading in the right direction as far as healing this Megan is concerned.  Patient is seen with her daughter today who has been helping to monitor this Megan at home. 05/23/17 on evaluation today patient appears to be doing well in regard to her right lateral ankle ulcer. She has been tolerating the dressing changes without any complication. With that being said she still is having discomfort although it's not nearly as significant and there does not appear to be any evidence of infection which is great news. No fevers, chills, nausea, or vomiting noted at this time. Patient History Information obtained from Patient. Family History Cancer - Father, Heart Disease - Paternal Grandparents, Lung Disease - Father, No family history of Diabetes, Hypertension, Kidney Disease, Seizures, Stroke, Thyroid Problems, Tuberculosis. Social History Never smoker, Marital Status - Widowed, Alcohol Use - Never, Drug Use - No History, Caffeine Use - Daily. Review of Systems (ROS) Constitutional Symptoms (General Health) Denies complaints or symptoms of Fever, Chills. Respiratory SABRIAH, HOBBINS ([id number]) The patient has no complaints or symptoms. Cardiovascular The patient has no complaints or symptoms. Psychiatric The patient has no complaints or symptoms. Objective Constitutional Well-nourished and well-hydrated in no acute distress. Vitals Time Taken: 12:39 PM, Height: 60 in,  Weight: 88.3 lbs, BMI: 17.2, Temperature: 98.0 F, Pulse: 80 bpm, Respiratory Rate: 16 breaths/min, Blood Pressure: 138/50 mmHg. Respiratory normal breathing without difficulty. clear to auscultation bilaterally. Cardiovascular regular rate and rhythm with normal S1, S2. Psychiatric this patient is able to make decisions and demonstrates good insight into disease process. Alert and Oriented x 3. pleasant and cooperative. General Notes: Megan did not require any debridement today and there is a small hole with tunnel noted at 10 o'clock but other than this overall she appears well. Integumentary (Hair, Skin) Megan #1 status is Open. Original cause of Megan was Trauma. The Megan is located on the Right,Lateral Ankle. The Megan measures 0.2cm length x 0.3cm width x 0.3cm depth; 0.047cm^2 area and 0.014cm^3 volume. There is Fat Layer (Subcutaneous Tissue) Exposed exposed. There is no undermining noted, however, there is tunneling at 10:00 with a maximum distance of 1.5cm. There is a medium amount of serous drainage noted. The Megan margin is flat and intact. There is medium (34-66%) granulation within the Megan bed. There is a medium (34-66%) amount of necrotic tissue within the Megan bed including Adherent Slough. The periwound skin appearance exhibited: Rubor. The periwound skin appearance did not exhibit: Callus, Crepitus, Excoriation, Induration, Rash, Scarring, Dry/Scaly, Maceration, Atrophie Blanche, Cyanosis, Ecchymosis, Hemosiderin Staining, Mottled, Pallor, Erythema. Periwound temperature was noted as No Abnormality. Assessment Active Problems ICD-10 S91.001A - Unspecified open Megan, right ankle, initial encounter L97.312 - Non-pressure chronic ulcer of right ankle with fat layer exposed F03.90 - Unspecified dementia without behavioral disturbance E44.0 - Moderate protein-calorie malnutrition Megan Carpenter, Megan J. ([id number]) I10 - Essential (primary) hypertension R73.03 -  Prediabetes Plan Megan Cleansing: Megan #1 Right,Lateral Ankle: Clean Megan with Normal Saline. Cleanse Megan with mild soap and water May Shower, gently pat Megan dry prior to applying new dressing. Anesthetic (add to Medication List): Megan #1 Right,Lateral Ankle: Topical Lidocaine 4% cream applied to Megan bed prior to debridement (In Clinic Only). Skin Barriers/Peri-Megan Care: Megan #1 Right,Lateral Ankle: Skin Prep Primary Megan Dressing: Megan #1 Right,Lateral Ankle: Iodoform packing Gauze - cut 1/4 inch gauze to pack into the 10 o'clock area of Megan Secondary Dressing: Megan #1 Right,Lateral Ankle: Boardered Foam Dressing Dressing Change Frequency: Megan #1 Right,Lateral Ankle: Change dressing every other day. Follow-up Appointments: Megan #1 Right,Lateral Ankle: Return Appointment in 1 week. Additional Orders /  Instructions: Megan #1 Right,Lateral Ankle: Vitamin A; Vitamin C, Zinc Increase protein intake. Home Health: Megan #1 Right,Lateral Ankle: Initiate Home Health for Skilled Nursing Home Health Nurse may visit PRN to address patient s Megan care needs. FACE TO FACE ENCOUNTER: MEDICARE and MEDICAID PATIENTS: I certify that this patient is under my care and that I had a face-to-face encounter that meets the physician face-to-face encounter requirements with this patient on this date. The encounter with the patient was in whole or in part for the following MEDICAL CONDITION: (primary reason for Home Healthcare) MEDICAL NECESSITY: I certify, that based on my findings, NURSING services are a medically necessary home health service. HOME BOUND STATUS: I certify that my clinical findings support that this patient is homebound (i.e., Due to illness or injury, pt requires aid of supportive devices such as crutches, cane, wheelchairs, walkers, the use of special transportation or the assistance of another person to leave their place of residence. There is a normal  inability to leave the home and doing so requires considerable and taxing effort. Other absences are for medical reasons / religious services and are infrequent or of short duration when for other reasons). If current dressing causes regression in Megan condition, may D/C ordered dressing product/s and apply Normal Saline Moist Dressing daily until next [facility] / Other MD appointment. Notify [facility] of regression in Megan condition at [phone]. Please direct any NON-Megan related issues/requests for orders to patient's Primary Care Physician Megan, Carpenter ([id number]) I am going to recommend packing this ulcer with Iodoform gauze quarter inch cut in half in order to allow this to continue to drain without showing over. Patient is in agreement with plan. We will see were things stand following in one weeks time. Please see above for specific Megan care orders. We will see patient for re-evaluation in 1 week(s) here in the clinic. If anything worsens or changes patient will contact our office for additional recommendations. Electronic Signature(s) Signed: 06/03/2017 10:42:09 AM By: Lenda Kelp PA-C Previous Signature: 05/26/2017 9:06:28 AM Version By: Lenda Kelp PA-C Entered By: Lenda Kelp on 06/03/2017 09:33:31 Megan Carpenter, Megan Carpenter ([id number]) -------------------------------------------------------------------------------- ROS/PFSH Details Patient Name: Megan Carpenter Date of Service: 05/23/2017 12:30 PM Medical Record Number: [id number] Patient Account Number: [id number] Date of Birth/Sex: 08-Jun-1928 (84 y.o. Female) Treating RN: Renne Crigler Primary Care Provider: Jonna Clark, Fayrene Fearing Other Clinician: Referring Provider: Jonna Clark, Fayrene Fearing Treating Provider/Extender: Linwood Dibbles, Nova Evett Weeks in Treatment: 2 Information Obtained From Patient Megan History Do you currently have one or more open woundso Yes How many open wounds do you currently haveo  1 Approximately how long have you had your woundso 2 weeks How have you been treating your Megan(s) until nowo bandaid Has your Megan(s) ever healed and then re-openedo No Have you had any lab work done in the past montho No Have you tested positive for an antibiotic resistant organism (MRSA, VRE)o No Have you tested positive for osteomyelitis (bone infection)o No Have you had any tests for circulation on your legso No Have you had other problems associated with your woundso Infection Constitutional Symptoms (General Health) Complaints and Symptoms: Negative for: Fever; Chills Eyes Medical History: Positive for: Cataracts - surgery bilateral Negative for: Glaucoma; Optic Neuritis Ear/Nose/Mouth/Throat Medical History: Negative for: Chronic sinus problems/congestion; Middle ear problems Hematologic/Lymphatic Medical History: Negative for: Anemia; Hemophilia; Human Immunodeficiency Virus; Lymphedema; Sickle Cell Disease Respiratory Complaints and Symptoms: No Complaints or Symptoms Medical History: Negative for: Aspiration; Asthma;  Chronic Obstructive Pulmonary Disease (COPD); Pneumothorax; Sleep Apnea; Tuberculosis Cardiovascular Complaints and Symptoms: No Complaints or Symptoms Medical HistoryDonley Carpenter: Himebaugh, Nioka J. ([id number]) Positive for: Hypertension Negative for: Angina; Arrhythmia; Congestive Heart Failure; Coronary Artery Disease; Deep Vein Thrombosis; Hypotension; Myocardial Infarction; Peripheral Arterial Disease; Peripheral Venous Disease; Phlebitis; Vasculitis Gastrointestinal Medical History: Negative for: Cirrhosis ; Colitis; Crohnos; Hepatitis A; Hepatitis B; Hepatitis C Endocrine Medical History: Negative for: Type I Diabetes; Type II Diabetes Genitourinary Medical History: Negative for: End Stage Renal Disease Immunological Medical History: Negative for: Lupus Erythematosus; Raynaudos; Scleroderma Integumentary (Skin) Medical History: Negative for:  History of Burn; History of pressure wounds Musculoskeletal Medical History: Negative for: Gout; Rheumatoid Arthritis; Osteoarthritis; Osteomyelitis Neurologic Medical History: Positive for: Dementia Negative for: Neuropathy; Quadriplegia; Paraplegia; Seizure Disorder Oncologic Medical History: Negative for: Received Chemotherapy; Received Radiation Psychiatric Complaints and Symptoms: No Complaints or Symptoms Medical History: Negative for: Anorexia/bulimia; Confinement Anxiety HBO Extended History Items Eyes: Cataracts Immunizations Pneumococcal Vaccine: Megan RedderDOBY, Austin J. ([id number]) Received Pneumococcal Vaccination: Yes Implantable Devices Family and Social History Cancer: Yes - Father; Diabetes: No; Heart Disease: Yes - Paternal Grandparents; Hypertension: No; Kidney Disease: No; Lung Disease: Yes - Father; Seizures: No; Stroke: No; Thyroid Problems: No; Tuberculosis: No; Never smoker; Marital Status - Widowed; Alcohol Use: Never; Drug Use: No History; Caffeine Use: Daily; Advanced Directives: No; Patient does not want information on Advanced Directives; Do not resuscitate: No; Living Will: No; Medical Power of Attorney: No Physician Affirmation I have reviewed and agree with the above information. Electronic Signature(s) Signed: 05/26/2017 9:06:28 AM By: Lenda KelpStone III, Mykel Mohl PA-C Signed: 05/27/2017 4:28:38 PM By: Renne CriglerFlinchum, Cheryl Entered By: Lenda KelpStone III, Maelee Hoot on 05/23/2017 17:46:48 Megan Carpenter, Megan J. ([id number]) -------------------------------------------------------------------------------- SuperBill Details Patient Name: Megan Carpenter, Chrishawna J. Date of Service: 05/23/2017 Medical Record Number: [id number] Patient Account Number: [id number] Date of Birth/Sex: [birth year] (84 y.o. Female) Treating RN: Renne CriglerFlinchum, Cheryl Primary Care Provider: Jonna ClarkHawkins Jr, Fayrene FearingJames Other Clinician: Referring Provider: Jonna ClarkHawkins Jr, Fayrene FearingJames Treating Provider/Extender: Linwood DibblesSTONE III, Darilyn Storbeck Weeks in Treatment: 2 Diagnosis  Coding ICD-10 Codes Code Description S91.001A Unspecified open Megan, right ankle, initial encounter L97.312 Non-pressure chronic ulcer of right ankle with fat layer exposed F03.90 Unspecified dementia without behavioral disturbance E44.0 Moderate protein-calorie malnutrition I10 Essential (primary) hypertension R73.03 Prediabetes Facility Procedures CPT4 Code: [id number] Description: [id number] - Megan CARE VISIT-LEV 2 EST PT Modifier: Quantity: 1 Physician Procedures CPT4 Code: [id number] Description: [address]9213 - WC PHYS LEVEL 3 - EST PT ICD-10 Diagnosis Description S91.001A Unspecified open Megan, right ankle, initial encounter 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

## 2017-05-30 ENCOUNTER — Ambulatory Visit: Payer: Medicare Other | Admitting: Physician Assistant

## 2017-06-18 ENCOUNTER — Telehealth: Payer: Self-pay | Admitting: Family Medicine

## 2017-06-18 NOTE — Telephone Encounter (Signed)
Patient's daughter called about concern of possible gas leak at her mother's home. She was not there with her and wanted to get advice. I was notified and made recommendations that were provided to patient's daughter by our staff, advised of potential symptoms of gas poisoning specifically carbon monoxide difficulty breathing, headache, flushed color change, and advised that they need to determine if actual leak and contact gas company, also one option is to call poison control [phone] to review concerns about potential exposure to gas and they review signs/symptoms and protocol, lastly if symptoms they should take her to hospital ED for more urgent evaluation.  Saralyn PilarAlexander Zaydenn Balaguer, DO [facility] 06/18/2017, 5:01 PM

## 2017-06-19 ENCOUNTER — Ambulatory Visit: Payer: Medicare Other | Admitting: Nurse Practitioner

## 2017-06-20 ENCOUNTER — Encounter: Payer: Self-pay | Admitting: Family Medicine

## 2017-06-20 ENCOUNTER — Ambulatory Visit (INDEPENDENT_AMBULATORY_CARE_PROVIDER_SITE_OTHER): Payer: Medicare Other | Admitting: Family Medicine

## 2017-06-20 VITALS — BP 147/68 | HR 70 | Temp 98.7°F | Resp 16 | Ht 60.0 in | Wt 95.0 lb

## 2017-06-20 DIAGNOSIS — R7303 Prediabetes: Secondary | ICD-10-CM

## 2017-06-20 DIAGNOSIS — G301 Alzheimer's disease with late onset: Secondary | ICD-10-CM | POA: Diagnosis not present

## 2017-06-20 DIAGNOSIS — E038 Other specified hypothyroidism: Secondary | ICD-10-CM | POA: Diagnosis not present

## 2017-06-20 DIAGNOSIS — F0281 Dementia in other diseases classified elsewhere with behavioral disturbance: Secondary | ICD-10-CM | POA: Diagnosis not present

## 2017-06-20 DIAGNOSIS — Z7729 Contact with and (suspected ) exposure to other hazardous substances: Secondary | ICD-10-CM | POA: Diagnosis not present

## 2017-06-20 DIAGNOSIS — F02818 Dementia in other diseases classified elsewhere, unspecified severity, with other behavioral disturbance: Secondary | ICD-10-CM

## 2017-06-20 DIAGNOSIS — E440 Moderate protein-calorie malnutrition: Secondary | ICD-10-CM

## 2017-06-20 DIAGNOSIS — E063 Autoimmune thyroiditis: Secondary | ICD-10-CM | POA: Diagnosis not present

## 2017-06-20 DIAGNOSIS — R296 Repeated falls: Secondary | ICD-10-CM | POA: Diagnosis not present

## 2017-06-20 NOTE — Assessment & Plan Note (Signed)
Chronic hypothyroidism Will check repeat TSH, Free T4 since >1 year now and cognitive decline For now continue Levothyroxine daily

## 2017-06-20 NOTE — Assessment & Plan Note (Signed)
PreDM had been stable Due for next A1c Will check with labs today

## 2017-06-20 NOTE — Progress Notes (Addendum)
Subjective:    Patient ID: Megan Carpenter, female    DOB: 09-10-1928, 84 y.o.   MRN: [id number]  Megan Carpenter is a 84 y.o. female presenting on 06/20/2017 for Dementia (as per patient notice she is getting worst with dementia, yesterday wandering and neighbour found her and called son, patient's daughter likes some help with her situation and may need  blood work )  Patient presents for a same day appointment.  Daughter Megan Carpenter [facility]) provided history primarily for patient, patient provides some history as well, limited by dementia.  HPI   Possible Gas Leak Exposure, Question Carbon Monoxide Exposure: See telephone note dated 06/18/17 for background details. Since the possible exposure at home due to broken pipe under house, family brought her to relatives house and she has not been at the house. She has not been back to the house. They called heating and air company and they could not confirm if there was a leak and of what. Patient did not exhibit any symptoms at that time, and they did not take her to be evaluated to Emergency Dept or anywhere else. - Today reports feels fine, just wanted to be checked out, and asking about blood tests, as it has been >1 year since last labs, including request for carbon monoxide exposure test. - Denies dyspnea, facial flushing or rash, chest pain, headache, nausea vomiting  DEMENTIA, with behavior disturbances / Protein Calorie Malnutrition / Recurrent Falls - Last visit with me 07/02/16, for same problem with Chronic dementia, Alzheimer's type, gradual worsening, primarily managed by daughter as caregiver, other assistance is limited, treated with given resources and counseling, no new treatment, see prior notes for background information. - Interval update with patient has been lost to follow-up for up to 1 year, has not returned to discuss Dementia further now has progressive decline - Today patient reports recently has shown more behavior changes with  wandering and there is a concern for safety - Daughter, caregiver cannot take care of her alone, and has limited support. She has reached out to several companies and other services but has not been able to get anything covered due to the patient's monetary assets she still has, and explains difficulty in arranging any follow-up with a Child psychotherapist - She cannot perform ADLs, request assistance with most tasks - She has Meals on Wheels, seems to be eating better. Still has low weight - Taking ensure supplement once daily - Admits recurrent falls, and knee pain - Admits some behavioral changes occasionally, some wandering no significant agitation or violent behavior - Admits reduced appetite and hydration, often can forget meals or not drink water regularly - Denies significant mood changes, depression, no harm to others   Depression screen [facility] 2/9 11/27/2015 03/28/2015  Decreased Interest 0 0  Down, Depressed, Hopeless 0 0  PHQ - 2 Score 0 0    Social History   Tobacco Use  . Smoking status: Never Smoker  . Smokeless tobacco: Never Used  Substance Use Topics  . Alcohol use: No  . Drug use: No    Review of Systems Per HPI unless specifically indicated above     Objective:    BP (!) 147/68   Pulse 70   Temp 98.7 F (37.1 C) (Oral)   Resp 16   Ht 5' (1.524 m)   Wt 95 lb (43.1 kg)   BMI 18.55 kg/m   Wt Readings from Last 3 Encounters:  06/20/17 95 lb (43.1 kg)  07/02/16 94  lb 9.6 oz (42.9 kg)  03/13/16 94 lb (42.6 kg)    Physical Exam  Constitutional: She appears well-developed and well-nourished. No distress.  Thin appearing, mostly comfortable, cooperative  HENT:  Head: Normocephalic and atraumatic.  Mouth/Throat: Oropharynx is clear and moist.  Eyes: Conjunctivae are normal. Right eye exhibits no discharge. Left eye exhibits no discharge.  Neck: Normal range of motion. Neck supple.  Cardiovascular: Normal rate, regular rhythm, normal heart sounds and intact distal  pulses.  No murmur heard. Pulmonary/Chest: Effort normal and breath sounds normal. No respiratory distress. She has no wheezes. She has no rales.  Musculoskeletal: Normal range of motion. She exhibits no edema.  Muscle atrophy and muscle wasting facial and generalized  Lymphadenopathy:    She has no cervical adenopathy.  Neurological: She is alert.  Disoriented  Skin: Skin is warm and dry. No rash noted. She is not diaphoretic. No erythema.  Psychiatric: Her behavior is normal.  Well groomed, good eye contact, she is verbal but has limited speech. She has poor insight into her medical condition and mental health. Follows commands.  Nursing note and vitals reviewed.    Results for orders placed or performed in visit on 07/02/16  POCT HgB A1C  Result Value Ref Range   Hemoglobin A1C 6.2    MMSE - Mini Mental State Exam 06/20/2017 11/27/2015  Orientation to time 1 2  Orientation to Place 4 3  Registration 3 3  Attention/ Calculation 4 3  Recall 0 1  Language- name 2 objects 2 2  Language- repeat 0 1  Language- follow 3 step command 3 0  Language- read & follow direction 1 1  Write a sentence 0 1  Copy design 0 0  Total score 18 17       Assessment & Plan:   Problem List Items Addressed This Visit    Dementia - Primary    Recent worsening, chronic progressive decline w/ Alzheimer's dementia, clinically FAST Stage 6 (needs help with ADLs but she still has limited verbal and is alert, therefore not FAST 7), some history of behavioral disturbance now with more wandering and more risk to self harm. - Increased caregiver burden, limited financial and availability, previous on [facility], and other resources - Meds: prior on Aricept, now off - Last MMSE 06/20/17 - 18/30 (prior 17/30)  Plan: 1. Reviewed Dementia prognosis and management, explained that may we most likely need additional support now - She has exhausted most available resources, also no longer available Dementia Services through  [facility] I have called [facility], and patient does not meet their criteria at this time, not FAST 7 - Hospice no longer has [facility] services either - Hospice CSW agreed to discuss case with patient's HCPOA to give them advice but cannot actively work on her case - Referral placed now today for [facility] for possibility of HH RN PT and CSW for determining future placement options - Follow-up as needed, if dramatic worsening or concern for her own safety then she should go to hospital ED for evaluation      Relevant Orders   Carboxyhemoglobin   COMPLETE METABOLIC PANEL WITH GFR   CBC with Differential/Platelet   Vitamin B12   Ambulatory referral to Home Health   Hypothyroidism    Chronic hypothyroidism Will check repeat TSH, Free T4 since >1 year now and cognitive decline For now continue Levothyroxine daily      Relevant Orders   TSH  T4, free   Prediabetes    PreDM had been stable Due for next A1c Will check with labs today      Relevant Orders   Hemoglobin A1c   Protein-calorie malnutrition, moderate (HCC)    Clinically consistent with protein calorie malnutrition based on some reduced subQ fatty tissue and muscle atrophy, BMI >18, no other systemic symptoms, in setting of dementia, reduced appetite.  Plan: 1. Continue ensure daily 2. Continue meals on wheels, caregiver support for regular meal intake 3. Labs today 4. Follow-up home health referral for nutritional support       Other Visit Diagnoses    Carbon monoxide exposure  Less likely given asymptomatic, and questionable exposure Agree to check labs including Carboxyhemoglobin       Relevant Orders   Carboxyhemoglobin   Recurrent falls     Suspected multifactorial can be weakness, muscle atrophy, age with alzheimer's [facility]erral [facility] for [facility] eval and strengthening      Relevant Orders    Ambulatory referral to Home Health      No orders of the defined types were  placed in this encounter.   Orders Placed This Encounter  Procedures  . Carboxyhemoglobin  . COMPLETE METABOLIC PANEL WITH GFR  . TSH  . T4, free  . CBC with Differential/Platelet  . Hemoglobin A1c  . Vitamin B12  . Ambulatory referral to Home Health    Referral Priority:   Routine    Referral Type:   Home Health Care    Referral Reason:   Specialty Services Required    [facility] Provider[facility]   [facility]    Requested Specialty:   Home Health Services    Number of Visits Requested:   1    Follow up plan: Return if symptoms worsen or fail to improve, for Dementia.  A total of >25 minutes was spent face-to-face with this patient. Greater than 50% of this time was spent in counseling on dementia and prognosis and treatment options and coordination of care with [facility] to locate resources, and with [facility], call back to patient's HCPOA to review options.  Saralyn PilarAlexander Carmina Walle, DO [facility] 06/20/2017, 1:32 PM

## 2017-06-20 NOTE — Assessment & Plan Note (Signed)
Clinically consistent with protein calorie malnutrition based on some reduced subQ fatty tissue and muscle atrophy, BMI >18, no other systemic symptoms, in setting of dementia, reduced appetite.  Plan: 1. Continue ensure daily 2. Continue meals on wheels, caregiver support for regular meal intake 3. Labs today 4. Follow-up home health referral for nutritional support

## 2017-06-20 NOTE — Patient Instructions (Addendum)
Thank you for coming to the office today.  1.   [facility] (including Home, ALF, SNF/Facility, available Inpatient) [address] [place], [place] [address] Ph [phone] [email or web address]  [facility] (require skilled medical need for nursing / PT, "homebound") [address] [place], [place] [address] Ph [phone] [email or web address]  Services Skilled RN, PT, OT, HH Aide, CSW, SLP  PALLATIVE CARE:  [facility] Address: [address] [place], [place] [address] Phone: [phone] (Main office # - call for general questions and learn more about services)   Please schedule a Follow-up Appointment to: Return if symptoms worsen or fail to improve, for Dementia.    If you have any other questions or concerns, please feel free to call the office or send a message through MyChart. You may also schedule an earlier appointment if necessary.  Additionally, you may be receiving a survey about your experience at our office within a few days to 1 week by e-mail or mail. We value your feedback.  Saralyn PilarAlexander Larae Caison, DO [facility]

## 2017-06-20 NOTE — Assessment & Plan Note (Signed)
Recent worsening, chronic progressive decline w/ Alzheimer's dementia, clinically FAST Stage 6 (needs help with ADLs but she still has limited verbal and is alert, therefore not FAST 7), some history of behavioral disturbance now with more wandering and more risk to self harm. - Increased caregiver burden, limited financial and availability, previous on HH, and other resources - Meds: prior on Aricept, now off  Plan: 1. Reviewed Dementia prognosis and management, explained that may we most likely need additional support now - She has exhausted most available resources, also no longer available Dementia Services through [facility] I have [facility], and patient does not meet their criteria at this time, not FAST 7 - Hospice no longer has [facility] services either - Hospice CSW agreed to discuss case with patient's HCPOA to give them advice but cannot actively work on her case - [facility] for possibility of HH RN PT and CSW for determining future placement options - Follow-up as needed, if dramatic worsening or concern for her own safety then she should go to hospital ED for evaluation

## 2017-06-23 ENCOUNTER — Other Ambulatory Visit: Payer: Self-pay | Admitting: Family Medicine

## 2017-06-23 DIAGNOSIS — E063 Autoimmune thyroiditis: Principal | ICD-10-CM

## 2017-06-23 DIAGNOSIS — E038 Other specified hypothyroidism: Secondary | ICD-10-CM

## 2017-06-24 ENCOUNTER — Telehealth: Payer: Self-pay | Admitting: Family Medicine

## 2017-06-24 LAB — COMPLETE METABOLIC PANEL WITH GFR
AG Ratio: 1.6 (calc) (ref 1.0–2.5)
ALT: 13 U/L (ref 6–29)
AST: 22 U/L (ref 10–35)
Albumin: 4.4 g/dL (ref 3.6–5.1)
Alkaline phosphatase (APISO): 62 U/L (ref 33–130)
BILIRUBIN TOTAL: 0.5 mg/dL (ref 0.2–1.2)
BUN: 11 mg/dL (ref 7–25)
CHLORIDE: 105 mmol/L (ref 98–110)
CO2: 27 mmol/L (ref 20–32)
Calcium: 9.5 mg/dL (ref 8.6–10.4)
Creat: 0.82 mg/dL (ref 0.60–0.88)
GFR, EST AFRICAN AMERICAN: 75 mL/min/{1.73_m2} (ref >=60)
GFR, Est Non African American: 65 mL/min/{1.73_m2} (ref >=60)
Globulin: 2.7 g/dL (calc) (ref 1.9–3.7)
Glucose, Bld: 108 mg/dL — ABNORMAL HIGH (ref 65–99)
Potassium: 4.3 mmol/L (ref 3.5–5.3)
Sodium: 140 mmol/L (ref 135–146)
TOTAL PROTEIN: 7.1 g/dL (ref 6.1–8.1)

## 2017-06-24 LAB — TSH: TSH: 59.51 m[IU]/L — AB (ref 0.40–4.50)

## 2017-06-24 LAB — CBC WITH DIFFERENTIAL/PLATELET
Basophils Absolute: 43 cells/uL (ref 0–200)
Basophils Relative: 0.5 %
EOS PCT: 0 %
Eosinophils Absolute: 0 cells/uL — ABNORMAL LOW (ref 15–500)
HEMATOCRIT: 43.7 % (ref 35.0–45.0)
Hemoglobin: 14.8 g/dL (ref 11.7–15.5)
LYMPHS ABS: 1462 {cells}/uL (ref 850–3900)
MCH: 29.9 pg (ref 27.0–33.0)
MCHC: 33.9 g/dL (ref 32.0–36.0)
MCV: 88.3 fL (ref 80.0–100.0)
MONOS PCT: 6.9 %
MPV: 10.7 fL (ref 7.5–12.5)
NEUTROS PCT: 75.6 %
Neutro Abs: 6502 cells/uL (ref 1500–7800)
Platelets: 237 10*3/uL (ref 140–400)
RBC: 4.95 10*6/uL (ref 3.80–5.10)
RDW: 13.2 % (ref 11.0–15.0)
Total Lymphocyte: 17 %
WBC mixed population: 593 cells/uL (ref 200–950)
WBC: 8.6 10*3/uL (ref 3.8–10.8)

## 2017-06-24 LAB — HEMOGLOBIN A1C
Hgb A1c MFr Bld: 5.9 % of total Hgb — ABNORMAL HIGH (ref <5.7)
MEAN PLASMA GLUCOSE: 123 (calc)
eAG (mmol/L): 6.8 (calc)

## 2017-06-24 LAB — T4, FREE: FREE T4: 0.6 ng/dL — AB (ref 0.8–1.8)

## 2017-06-24 LAB — CARBOXYHEMOGLOBIN: Carboxyhemoglobin: 3 %TOTAL HGB (ref <=12)

## 2017-06-24 LAB — VITAMIN B12: Vitamin B-12: 450 pg/mL (ref 200–1100)

## 2017-06-24 NOTE — Telephone Encounter (Signed)
Noted  

## 2017-06-24 NOTE — Telephone Encounter (Signed)
[facility] said pt was not available for home health eval today.  He will try again later this week.

## 2017-06-25 ENCOUNTER — Ambulatory Visit: Payer: Medicare Other | Admitting: Family Medicine

## 2017-06-26 ENCOUNTER — Telehealth: Payer: Self-pay | Admitting: Family Medicine

## 2017-06-26 NOTE — Telephone Encounter (Signed)
Amy with [facility] needs to know what assistance pt is needing at home [phone]

## 2017-06-26 NOTE — Telephone Encounter (Signed)
Faxed paperwork 

## 2017-06-26 NOTE — Telephone Encounter (Signed)
Left message for patient to call back  

## 2017-06-26 NOTE — Telephone Encounter (Signed)
[facility] not [facility] so [facility] was [facility] to [facility] as per daughter's request Dr. Kirtland BouchardK is aware.

## 2017-07-10 ENCOUNTER — Other Ambulatory Visit: Payer: Self-pay | Admitting: Family Medicine

## 2017-07-10 DIAGNOSIS — E034 Atrophy of thyroid (acquired): Secondary | ICD-10-CM

## 2017-09-16 ENCOUNTER — Telehealth: Payer: Self-pay | Admitting: Family Medicine

## 2017-09-16 NOTE — Telephone Encounter (Signed)
Advised daughter to find out the place first and let us know we will fill out paperwork based on their requirement, some time some facilities require patient to have appointment with PCP within 60 days limit, so patient might need appointment.  Pt's daughter Larene BeachVickie will call us back with more information after they decide which facilities.

## 2017-09-16 NOTE — Telephone Encounter (Signed)
Pt's daughter Larene BeachVickie is in the process of finding a place for pt to due to her alzheimers.  She will  need to have a FL2 filled out when she finds a place.  Her call back number is [phone]

## 2017-09-25 ENCOUNTER — Ambulatory Visit (INDEPENDENT_AMBULATORY_CARE_PROVIDER_SITE_OTHER): Payer: Medicare Other | Admitting: Family Medicine

## 2017-09-25 ENCOUNTER — Encounter: Payer: Self-pay | Admitting: Family Medicine

## 2017-09-25 VITALS — BP 142/65 | HR 63 | Temp 98.1°F | Resp 16 | Ht 60.0 in | Wt 91.0 lb

## 2017-09-25 DIAGNOSIS — G301 Alzheimer's disease with late onset: Secondary | ICD-10-CM

## 2017-09-25 DIAGNOSIS — F0281 Dementia in other diseases classified elsewhere with behavioral disturbance: Secondary | ICD-10-CM | POA: Diagnosis not present

## 2017-09-25 DIAGNOSIS — B070 Plantar wart: Secondary | ICD-10-CM

## 2017-09-25 DIAGNOSIS — S81812A Laceration without foreign body, left lower leg, initial encounter: Secondary | ICD-10-CM

## 2017-09-25 DIAGNOSIS — E440 Moderate protein-calorie malnutrition: Secondary | ICD-10-CM | POA: Diagnosis not present

## 2017-09-25 MED ORDER — MUPIROCIN 2 % EX OINT
1.0000 | TOPICAL_OINTMENT | Freq: Two times a day (BID) | CUTANEOUS | 0 refills | Status: DC
Start: 2017-09-25 — End: 2018-11-28

## 2017-09-25 NOTE — Patient Instructions (Addendum)
Thank you for coming to the office today.  Does not look like current skin infection  Use antibiotic ointment twice daily for 7 to 10 days to prevent infection and help it heal, slow process may take 1-2 or more weeks to fully heal Avoid scratching  For plantar wart - try OTC kit such as chemical pad to stick to it and kills top layer of skin or can use topical freezing  [facility] Address: [address], [place], [place] [address] Hours: Open 8AM-5PM Phone: [phone]  We will complete FL2 and have it available as soon as we can - call back with updates  Please schedule a Follow-up Appointment to: Return if symptoms worsen or fail to improve, for left leg skin sore / wart.  If you have any other questions or concerns, please feel free to call the office or send a message through [facility]. You may also schedule an earlier appointment if necessary.  Additionally, you may be receiving a survey about your experience at our office within a few days to 1 week by e-mail or mail. We value your feedback.  Nobie Putnam, DO [place]

## 2017-09-25 NOTE — Progress Notes (Signed)
Subjective:    Patient ID: Megan Carpenter, female    DOB: Jun 26, 1928, 84 y.o.   MRN: [id number]  Megan Carpenter is a 84 y.o. female presenting on 09/25/2017 for Lower extremity ulceration (left lower leg)  History provided by patient's daughter, Megan Carpenter. Patient unable to provide accurate history w/ dementia.  HPI   Left Lower Extremity Ulceration New concern over past 1 week. She thinks injury was due to "falling blind" hit her leg about 1 week ago caused scrap or abrasion. She did not share with her daughter until recently. History of prior ulceration, [facility], has been on antibiotics before for lower extremity/foot infection. - Today without significant complaint from this spot, seems to be healing - She does admit itching - Denies significant pain from this leg abrasion - Denies fever, chills, redness spreading, drainage  Plantar Wart, L foot Chronic problem for while, recent worsening left mid foot "hard spot" or "wart" on bottom of foot, has had before. Not established with podiatry. Able to walk on it but is painful at times.  Additional update  DEMENTIA, with behavior disturbances / Protein Calorie Malnutrition / Recurrent Falls - Last note 06/2017 for updates - Now has apt with [facility] tomorrow and also working with Social Services for placement. She will request an FL2 form. - Seems that her dementia is progressing, still has behavior problems, requires constant care.   Depression screen [facility] 2/9 09/25/2017 11/27/2015 03/28/2015  Decreased Interest 0 0 0  Down, Depressed, Hopeless 0 0 0  PHQ - 2 Score 0 0 0    Social History   Tobacco Use  . Smoking status: Never Smoker  . Smokeless tobacco: Never Used  Substance Use Topics  . Alcohol use: No  . Drug use: No    Review of Systems Per HPI unless specifically indicated above     Objective:    BP (!) 142/65   Pulse 63   Temp 98.1 F (36.7 C) (Oral)   Resp 16   Ht 5' (1.524  m)   Wt 91 lb (41.3 kg)   BMI 17.77 kg/m   Wt Readings from Last 3 Encounters:  09/25/17 91 lb (41.3 kg)  06/20/17 95 lb (43.1 kg)  07/02/16 94 lb 9.6 oz (42.9 kg)    Physical Exam  Constitutional: She appears well-developed and well-nourished. No distress.  Thin, chronically ill appearing, comfortable, cooperative  HENT:  Head: Normocephalic and atraumatic.  Mouth/Throat: Oropharynx is clear and moist.  Eyes: Conjunctivae are normal. Right eye exhibits no discharge. Left eye exhibits no discharge.  Cardiovascular: Normal rate.  Pulmonary/Chest: Effort normal.  Musculoskeletal: She exhibits no edema.  Neurological: She is alert.  Skin: Skin is warm and dry. No rash noted. She is not diaphoretic. No erythema.  Left lower leg - see picture - 2 x 1.5 cm superficial skin tear abrasion with flap of skin overlaying and healing, with surrounding scab and granulation tissue. No open ulceration or deeper wound. No abscess non tender no drainage  Left Foot Mid to Hindfoot with localized plantar wart - palpable firm nodular density with characteristic core, not inflamed mild tender  Psychiatric: Her behavior is normal.  Nursing note and vitals reviewed.    Left lower leg anterior skin tear w/ scab     Results for orders placed or performed in visit on 06/20/17  Carboxyhemoglobin  Result Value Ref Range   Carboxyhemoglobin 3 <=12 %TOTAL HGB  COMPLETE METABOLIC PANEL  WITH GFR  Result Value Ref Range   Glucose, Bld 108 (H) 65 - 99 mg/dL   BUN 11 7 - 25 mg/dL   Creat 1.91 4.78 - 2.95 mg/dL   GFR, Est Non African American 65 > OR = 60 mL/min/1.2m2   GFR, Est African American 75 > OR = 60 mL/min/1.61m2   BUN/Creatinine Ratio NOT APPLICABLE 6 - 22 (calc)   Sodium 140 135 - 146 mmol/L   Potassium 4.3 3.5 - 5.3 mmol/L   Chloride 105 98 - 110 mmol/L   CO2 27 20 - 32 mmol/L   Calcium 9.5 8.6 - 10.4 mg/dL   Total Protein 7.1 6.1 - 8.1 g/dL   Albumin 4.4 3.6 - 5.1 g/dL   Globulin 2.7  1.9 - 3.7 g/dL (calc)   AG Ratio 1.6 1.0 - 2.5 (calc)   Total Bilirubin 0.5 0.2 - 1.2 mg/dL   Alkaline phosphatase (APISO) 62 33 - 130 U/L   AST 22 10 - 35 U/L   ALT 13 6 - 29 U/L  TSH  Result Value Ref Range   TSH 59.51 (H) 0.40 - 4.50 mIU/L  T4, free  Result Value Ref Range   Free T4 0.6 (L) 0.8 - 1.8 ng/dL  CBC with Differential/Platelet  Result Value Ref Range   WBC 8.6 3.8 - 10.8 Thousand/uL   RBC 4.95 3.80 - 5.10 Million/uL   Hemoglobin 14.8 11.7 - 15.5 g/dL   HCT 62.1 30.8 - 65.7 %   MCV 88.3 80.0 - 100.0 fL   MCH 29.9 27.0 - 33.0 pg   MCHC 33.9 32.0 - 36.0 g/dL   RDW 84.6 96.2 - 95.2 %   Platelets 237 140 - 400 Thousand/uL   MPV 10.7 7.5 - 12.5 fL   Neutro Abs 6,502 1,500 - 7,800 cells/uL   Lymphs Abs 1,462 850 - 3,900 cells/uL   WBC mixed population 593 200 - 950 cells/uL   Eosinophils Absolute 0 (L) 15 - 500 cells/uL   Basophils Absolute 43 0 - 200 cells/uL   Neutrophils Relative % 75.6 %   Total Lymphocyte 17.0 %   Monocytes Relative 6.9 %   Eosinophils Relative 0.0 %   Basophils Relative 0.5 %  Hemoglobin A1c  Result Value Ref Range   Hgb A1c MFr Bld 5.9 (H) <5.7 % of total Hgb   Mean Plasma Glucose 123 (calc)   eAG (mmol/L) 6.8 (calc)  Vitamin B12  Result Value Ref Range   Vitamin B-12 450 200 - 1,100 pg/mL      Assessment & Plan:   Problem List Items Addressed This Visit    Late onset Alzheimer's disease with behavioral disturbance    Chronic progressive decline w/ Alzheimer's dementia, clinically FAST Stage 6 (needs help with ADLs but she still has limited verbal and is alert, therefore not FAST 7), some history of behavioral disturbance now with more wandering and more risk to self harm. - Increased caregiver burden, limited financial and availability, previous on HH, and other resources - Meds: prior on Aricept, now off  Plan: 1. Agree with proceed with placement through assistance of Social Services and other agency she is currently working in,  will need higher level of care and constant supervision - Will make FL2 available upon request - Follow-up as needed, if dramatic worsening or concern for her own safety then she should go to hospital ED for evaluation      Protein-calorie malnutrition, moderate (HCC) Persistent problem, encourage continue improving diet / protein  supplement    Other Visit Diagnoses    Noninfected skin tear of left lower extremity, initial encounter    -  Primary Clinically not consistent with active infection. Seems to have superficial abrasion vs skin tear that is healing. - Empiric trial of topical antibiotic to prevent infection and help heal - Use mupirocin BID 7-10 days Return criteria given if worsening    Relevant Medications   mupirocin ointment (BACTROBAN) 2 %   Plantar wart of left foot    Uncomplicated, concern with foot pain may lead to difficulty ambulating and fall Trial of OTC treatments for wart removal If unsuccessful, [facility] if request     Relevant Medications   mupirocin ointment (BACTROBAN) 2 %      Meds ordered this encounter  Medications  . mupirocin ointment (BACTROBAN) 2 %    Sig: Apply 1 application topically 2 (two) times daily. For 7-10 days    Dispense:  22 g    Refill:  0    Follow up plan: Return if symptoms worsen or fail to improve, for left leg skin sore / wart.  Saralyn PilarAlexander Camille Thau, DO [facility] 09/25/2017, 4:18 PM

## 2017-09-25 NOTE — Assessment & Plan Note (Addendum)
Chronic progressive decline w/ Alzheimer's dementia, clinically FAST Stage 6 (needs help with ADLs but she still has limited verbal and is alert, therefore not FAST 7), some history of behavioral disturbance now with more wandering and more risk to self harm. - Increased caregiver burden, limited financial and availability, previous on HH, and other resources - Meds: prior on Aricept, now off  Plan: 1. Agree with proceed with placement through assistance of Social Services and other agency she is currently working in, will need higher level of care and constant supervision - Will make FL2 available upon request - Follow-up as needed, if dramatic worsening or concern for her own safety then she should go to hospital ED for evaluation

## 2017-09-26 ENCOUNTER — Ambulatory Visit: Payer: Medicare Other | Admitting: Family Medicine

## 2017-10-21 ENCOUNTER — Other Ambulatory Visit: Payer: Self-pay | Admitting: Family Medicine

## 2017-10-21 DIAGNOSIS — E034 Atrophy of thyroid (acquired): Secondary | ICD-10-CM

## 2017-12-19 ENCOUNTER — Other Ambulatory Visit: Payer: Self-pay | Admitting: Family Medicine

## 2018-02-19 ENCOUNTER — Other Ambulatory Visit: Payer: Self-pay | Admitting: Family Medicine

## 2018-02-19 DIAGNOSIS — E034 Atrophy of thyroid (acquired): Secondary | ICD-10-CM

## 2018-03-30 ENCOUNTER — Ambulatory Visit (INDEPENDENT_AMBULATORY_CARE_PROVIDER_SITE_OTHER): Payer: Medicare Other | Admitting: Family Medicine

## 2018-03-30 ENCOUNTER — Encounter: Payer: Self-pay | Admitting: Family Medicine

## 2018-03-30 VITALS — BP 146/69 | HR 66 | Temp 98.2°F | Resp 16 | Ht 60.0 in | Wt 93.4 lb

## 2018-03-30 DIAGNOSIS — E440 Moderate protein-calorie malnutrition: Secondary | ICD-10-CM | POA: Diagnosis not present

## 2018-03-30 DIAGNOSIS — F0281 Dementia in other diseases classified elsewhere with behavioral disturbance: Secondary | ICD-10-CM

## 2018-03-30 DIAGNOSIS — G301 Alzheimer's disease with late onset: Secondary | ICD-10-CM

## 2018-03-30 DIAGNOSIS — F02818 Dementia in other diseases classified elsewhere, unspecified severity, with other behavioral disturbance: Secondary | ICD-10-CM

## 2018-03-30 NOTE — Patient Instructions (Addendum)
Thank you for coming to the office today.  Next step - contact them for a Case Manager / Social Worker help  ?[facility] [address] [place], [place] [address]  Phone: [phone] [email or web address]  Once you find out what services or direction is available.  ------------------------------------------------------------  If needed - we can consider refer to Home Hospice or Palliative Care - and what options are available for Respite Care   Please schedule a Follow-up Appointment to: Return in about 3 months (around 06/29/2018) for Dementia.  If you have any other questions or concerns, please feel free to call the office or send a message through MyChart. You may also schedule an earlier appointment if necessary.  Additionally, you may be receiving a survey about your experience at our office within a few days to 1 week by e-mail or mail. We value your feedback.  Saralyn PilarAlexander Karamalegos, DO [facility]

## 2018-03-30 NOTE — Progress Notes (Signed)
Subjective:    Patient ID: Megan RedderBarbara J Cavanah, female    DOB: 1928/05/04, 84 y.o.   MRN: [id number]  Megan RedderBarbara J Jurewicz is a 84 y.o. female presenting on 03/30/2018 for Dementia (getting worst from past 2 month)  History provided by patient's daughter, Larene BeachVickie. Patient unable to provide accurate history w/ dementia.  HPI   DEMENTIA, with behavior disturbances / Protein Calorie Malnutrition / Recurrent Falls - Last visit with me 09/2017, reviewed same problem with Chronic dementia, Alzheimer's type, gradual worsening, primarily managed by daughter as caregiver, see prior notes for background information. - Interval updates - patient has had [facility] home health in past, limited time only, she did not have access to SNF or other care facility. Daughter is primary caregiver still and she has had difficulty managing at home, she has tried to get more help, has temporary hired help for few hours a week, she did not meet hospice criteria in past, now seems to be progressing  - Today caregiver reports that patient has had worsening confusion with dementia. She is not aware of her surroundings at home. Rarely rests and sits, she is often wandering. She used to walk her dog 10+ times a day, but her dog has passed away recently. She has limited support at home, other than daughter caregiver, who is having difficulty keeping up with care, limited financially due to her existing monetary assets - She cannot perform ADLs, request assistance with most tasks, including bathing - she declines to shower often or will say she has but hasn't. She refuses help with this ADL. She cannot do meal prep. Often requires assistance with feeding. - Regarding communication - her verbal communication is limited, reduced words and phrases, she often has difficulty comprehending tasks or requests. - She has Meals on Wheels, seems to be eating better. Still has low weight, but some wt gain recently. - Taking ensure supplement once daily -  Admits recurrent falls, and knee pain at times related - no recent significant fail with injury - Admits some behavioral changes worsening with wandering and confusion episodes, no violent behavior - Denies significant mood changes, depression, no harm to others   [facility] 03/30/2018 09/25/2017 11/27/2015  Decreased Interest 0 0 0  Down, Depressed, Hopeless 0 0 0  PHQ - 2 Score 0 0 0    Social History   Tobacco Use  . Smoking status: Never Smoker  . Smokeless tobacco: Never Used  Substance Use Topics  . Alcohol use: No  . Drug use: No    Review of Systems Per HPI unless specifically indicated above     Objective:    BP (!) 146/69   Pulse 66   Temp 98.2 F (36.8 C) (Oral)   Resp 16   Ht 5' (1.524 m)   Wt 93 lb 6.4 oz (42.4 kg)   BMI 18.24 kg/m   Wt Readings from Last 3 Encounters:  03/30/18 93 lb 6.4 oz (42.4 kg)  09/25/17 91 lb (41.3 kg)  06/20/17 95 lb (43.1 kg)    Physical Exam Vitals signs and nursing note reviewed.  Constitutional:      General: She is not in acute distress.    Appearance: She is well-developed. She is not diaphoretic.     Comments: Thin appearing, mostly comfortable, cooperative  HENT:     Head: Normocephalic and atraumatic.  Eyes:     General:        Right eye: No discharge.  Left eye: No discharge.     Conjunctiva/sclera: Conjunctivae normal.  Neck:     Musculoskeletal: Normal range of motion and neck supple.  Cardiovascular:     Rate and Rhythm: Normal rate and regular rhythm.     Heart sounds: Normal heart sounds. No murmur.  Pulmonary:     Effort: Pulmonary effort is normal. No respiratory distress.     Breath sounds: Normal breath sounds. No wheezing or rales.  Musculoskeletal: Normal range of motion.     Comments: Muscle atrophy and muscle wasting facial and generalized  Lymphadenopathy:     Cervical: No cervical adenopathy.  Skin:    General: Skin is warm and dry.     Findings: No erythema or rash.    Neurological:     Mental Status: She is alert.     Comments: Disoriented  Psychiatric:        Behavior: Behavior normal.     Comments: Hygiene is poor, well dressed, not always making eye contact, she is verbal but has limited speech, only few words, partial answer response only. She has poor insight into her medical condition and mental health. Follows some commands.    Results for orders placed or performed in visit on 06/20/17  Carboxyhemoglobin  Result Value Ref Range   Carboxyhemoglobin 3 <=12 %TOTAL HGB  COMPLETE METABOLIC PANEL WITH GFR  Result Value Ref Range   Glucose, Bld 108 (H) 65 - 99 mg/dL   BUN 11 7 - 25 mg/dL   Creat 1.61 0.96 - 0.45 mg/dL   GFR, Est Non African American 65 > OR = 60 mL/min/1.56m2   GFR, Est African American 75 > OR = 60 mL/min/1.54m2   BUN/Creatinine Ratio NOT APPLICABLE 6 - 22 (calc)   Sodium 140 135 - 146 mmol/L   Potassium 4.3 3.5 - 5.3 mmol/L   Chloride 105 98 - 110 mmol/L   CO2 27 20 - 32 mmol/L   Calcium 9.5 8.6 - 10.4 mg/dL   Total Protein 7.1 6.1 - 8.1 g/dL   Albumin 4.4 3.6 - 5.1 g/dL   Globulin 2.7 1.9 - 3.7 g/dL (calc)   AG Ratio 1.6 1.0 - 2.5 (calc)   Total Bilirubin 0.5 0.2 - 1.2 mg/dL   Alkaline phosphatase (APISO) 62 33 - 130 U/L   AST 22 10 - 35 U/L   ALT 13 6 - 29 U/L  TSH  Result Value Ref Range   TSH 59.51 (H) 0.40 - 4.50 mIU/L  T4, free  Result Value Ref Range   Free T4 0.6 (L) 0.8 - 1.8 ng/dL  CBC with Differential/Platelet  Result Value Ref Range   WBC 8.6 3.8 - 10.8 Thousand/uL   RBC 4.95 3.80 - 5.10 Million/uL   Hemoglobin 14.8 11.7 - 15.5 g/dL   HCT 40.9 81.1 - 91.4 %   MCV 88.3 80.0 - 100.0 fL   MCH 29.9 27.0 - 33.0 pg   MCHC 33.9 32.0 - 36.0 g/dL   RDW 78.2 95.6 - 21.3 %   Platelets 237 140 - 400 Thousand/uL   MPV 10.7 7.5 - 12.5 fL   Neutro Abs 6,502 1,500 - 7,800 cells/uL   Lymphs Abs 1,462 850 - 3,900 cells/uL   WBC mixed population 593 200 - 950 cells/uL   Eosinophils Absolute 0 (L) 15 - 500  cells/uL   Basophils Absolute 43 0 - 200 cells/uL   Neutrophils Relative % 75.6 %   Total Lymphocyte 17.0 %   Monocytes Relative 6.9 %  Eosinophils Relative 0.0 %   Basophils Relative 0.5 %  Hemoglobin A1c  Result Value Ref Range   Hgb A1c MFr Bld 5.9 (H) <5.7 % of total Hgb   Mean Plasma Glucose 123 (calc)   eAG (mmol/L) 6.8 (calc)  Vitamin B12  Result Value Ref Range   Vitamin B-12 450 200 - 1,100 pg/mL      Assessment & Plan:   Problem List Items Addressed This Visit    Late onset Alzheimer's disease with behavioral disturbance (HCC) - Primary    Chronic progressive decline w/ Alzheimer's dementia, clinically FAST Stage 6 to 7 - she has worsening verbal communication, needs more help with ADLs and has worsening wandering that may increase risk to patient. - Increased caregiver burden, limited financial and availability, previous on HH, and other resources - Meds: prior on Aricept, now off  Plan: 1. Again - advised that she needs case Production designer, theatre/television/film / Child psychotherapist to help locate appropriate care / facility / home care options - advised that she should go through [employer] to contact case worker to help her. Most likely she will need repeat course of Home Health with evaluation / including CSW and recommend escalated level of care, if cannot get 24 hour caregiver supervision. Also PCS services may be needed - but limited financially.  - Lastly patient's daughter should contact us to request Home Hospice evaluation if needed, as she may be closer to meeting criteria now and could use the assessment  Ultimately an option that will offer better 24 hr supervision for patient would be most beneficial for patient's safety, and also caregiver may benefit from respite care options  - Will make FL2 available upon request  - Follow-up as needed, if dramatic worsening or concern for her own safety then she should go to hospital ED for evaluation      Protein-calorie malnutrition,  moderate (HCC)      No orders of the defined types were placed in this encounter.   Follow up plan: Return in about 3 months (around 06/29/2018) for Dementia.  Saralyn Pilar, DO [facility] 03/30/2018, 8:38 AM

## 2018-03-30 NOTE — Assessment & Plan Note (Signed)
Chronic progressive decline w/ Alzheimer's dementia, clinically FAST Stage 6 to 7 - she has worsening verbal communication, needs more help with ADLs and has worsening wandering that may increase risk to patient. - Increased caregiver burden, limited financial and availability, previous on HH, and other resources - Meds: prior on Aricept, now off  Plan: 1. Again - advised that she needs case Production designer, theatre/television/filmmanager / Child psychotherapistsocial worker to help locate appropriate care / facility / home care options - advised that she should go through [facility] to contact case worker to help her. Most likely she will need repeat course of Home Health with evaluation / including CSW and recommend escalated level of care, if cannot get 24 hour caregiver supervision. Also PCS services may be needed - but limited financially.  - Lastly patient's daughter should contact us to request Home Hospice evaluation if needed, as she may be closer to meeting criteria now and could use the assessment  Ultimately an option that will offer better 24 hr supervision for patient would be most beneficial for patient's safety, and also caregiver may benefit from respite care options  - Will make FL2 available upon request  - Follow-up as needed, if dramatic worsening or concern for her own safety then she should go to hospital ED for evaluation

## 2018-08-03 ENCOUNTER — Ambulatory Visit (INDEPENDENT_AMBULATORY_CARE_PROVIDER_SITE_OTHER): Payer: Medicare Other | Admitting: Family Medicine

## 2018-08-03 ENCOUNTER — Encounter: Payer: Self-pay | Admitting: Family Medicine

## 2018-08-03 ENCOUNTER — Other Ambulatory Visit: Payer: Self-pay

## 2018-08-03 VITALS — Ht 60.0 in | Wt 90.0 lb

## 2018-08-03 DIAGNOSIS — I10 Essential (primary) hypertension: Secondary | ICD-10-CM | POA: Diagnosis not present

## 2018-08-03 DIAGNOSIS — K219 Gastro-esophageal reflux disease without esophagitis: Secondary | ICD-10-CM | POA: Diagnosis not present

## 2018-08-03 DIAGNOSIS — R634 Abnormal weight loss: Secondary | ICD-10-CM | POA: Diagnosis not present

## 2018-08-03 DIAGNOSIS — J3800 Paralysis of vocal cords and larynx, unspecified: Secondary | ICD-10-CM | POA: Diagnosis not present

## 2018-08-03 DIAGNOSIS — E43 Unspecified severe protein-calorie malnutrition: Secondary | ICD-10-CM

## 2018-08-03 DIAGNOSIS — F02818 Dementia in other diseases classified elsewhere, unspecified severity, with other behavioral disturbance: Secondary | ICD-10-CM

## 2018-08-03 DIAGNOSIS — G301 Alzheimer's disease with late onset: Secondary | ICD-10-CM | POA: Diagnosis not present

## 2018-08-03 DIAGNOSIS — Z515 Encounter for palliative care: Secondary | ICD-10-CM | POA: Insufficient documentation

## 2018-08-03 DIAGNOSIS — F0281 Dementia in other diseases classified elsewhere with behavioral disturbance: Secondary | ICD-10-CM

## 2018-08-03 DIAGNOSIS — E039 Hypothyroidism, unspecified: Secondary | ICD-10-CM | POA: Diagnosis not present

## 2018-08-03 NOTE — Progress Notes (Addendum)
Virtual Visit via Telephone The purpose of this virtual visit is to provide medical care while limiting exposure to the novel coronavirus (COVID19) for both patient and office staff.  Consent was obtained for phone visit:  Yes.   Answered questions that patient had about telehealth interaction:  Yes.   I discussed the limitations, risks, security and privacy concerns of performing an evaluation and management service by telephone. I also discussed with the patient that there may be a patient responsible charge related to this service. The patient expressed understanding and agreed to proceed.  Patient Location: Home Provider Location: [facility])  ---------------------------------------------------------------------- Chief Complaint  Patient presents with  . Dementia    back pains b/p 139/90, pulse 78-82, BS 156, SPO2 96 denies temp onset yesterday  . Anorexia    S: Reviewed CMA documentation. I have called patient, spoke to daughter, [name], due to patient's dementia, I have gathered additional HPI as follows:  DEMENTIA, with behavior disturbances / Protein Calorie Malnutrition/ Recurrent Falls - Last visit with 03/2018, reviewed same problemwithChronic dementia, Alzheimer's type, gradual worsening, primarily managed by daughter as caregiver, see prior notes for background information. - Interval updates - in past 3-4 months patient has continued to decline, and patient's daughter unable to reach any additional resources, attempted to do home health or work towards ALF vs SNF but unable to proceed limited due to financial and patient not meeting certain criteria.  Now today called with continued worsening dementia and associated symptoms. She has had worsening reduced appetite, poor PO intake over past few weeks to days, down 3-5 more lbs in last visit, reduce meals and refusing intake at times. Some days are better than others.   - Today caregiver still reports  that patient has had worsening confusion with dementia. She is not aware of her surroundings at home and sundowning episodes at night poor sleep and activity. Rarely rests and sits, she is often wandering.  She has limited support at home, other than daughter caregiver, who is having difficulty keeping up with care at times, she is nearby and is often available. - She cannot perform ADLs, request assistance with most tasks, including bathing - she declines to shower often or will say she has but hasn't. She refuses help with this ADL. She cannot do meal prep. Often requires assistance with feeding. - Regarding communication - her verbal communication is limited, reduced words and phrases, she often has difficulty comprehending tasks or requests. - tried ensure supplement - taking Advil PRN back pain - Admits recurrent falls - recently had low back pain by report, they called paramedic, and were concerned about possible UTI or kidney problem, but patient not exhibiting many UTI urinary dysmptoms at this time - Admits some behavioral changes worsening with wandering and confusion episodes, no violent behavior - Denies significant mood changes, depression, no harm to others, dysuria, hematuria, nausea vomiting , fever chills   Past Medical History:  Diagnosis Date  . GERD (gastroesophageal reflux disease)   . Hypertension   . Thyroid disease    Social History   Tobacco Use  . Smoking status: Never Smoker  . Smokeless tobacco: Never Used  Substance Use Topics  . Alcohol use: No  . Drug use: No    Current Outpatient Medications:  .  alendronate (FOSAMAX) 70 MG tablet, TAKE ONE TABLET BY MOUTH ONCE A WEEK, Disp: 4 tablet, Rfl: 5 .  levothyroxine (SYNTHROID, LEVOTHROID) 100 MCG tablet, TAKE ONE TABLET BY MOUTH EVERY  DAY BEFORE BREAKFAST, Disp: 30 tablet, Rfl: 2 .  losartan (COZAAR) 25 MG tablet, TAKE 1/2 TABLET EACH AM, Disp: 30 tablet, Rfl: 6 .  mupirocin ointment (BACTROBAN) 2 %, Apply 1  application topically 2 (two) times daily. For 7-10 days, Disp: 22 g, Rfl: 0 .  omeprazole (PRILOSEC) 20 MG capsule, TAKE 1 CAPSULE BY MOUTH DAILY BEFORE BREAKFAST TAKE FOR 2-4 WEEKS AT A TIME ONLY IF NEEDED FOR WORSENING ACID REFLUX, Disp: 30 capsule, Rfl: 2  Depression screen [facility] 2/9 03/30/2018 09/25/2017 11/27/2015  Decreased Interest 0 0 0  Down, Depressed, Hopeless 0 0 0  PHQ - 2 Score 0 0 0    No flowsheet data found.  -------------------------------------------------------------------------- O: No physical exam performed due to remote telephone encounter.  Ht 5' (1.524 m)   Wt 90 lb (40.8 kg)   BMI 17.58 kg/m   [facility]   08/03/18 0918  Weight: 90 lb (40.8 kg)   03/30/18 - 93.4 lbs 06/20/17 - 95 lbs  Lab results reviewed.  CMP Latest Ref Rng & Units 06/20/2017 02/26/2016 03/30/2015  Glucose 65 - 99 mg/dL 108(H) 163(H) 113(H)  BUN 7 - 25 mg/dL 11 16 15   Creatinine 0.60 - 0.88 mg/dL 0.82 0.73 0.75  Sodium 135 - 146 mmol/L 140 139 142  Potassium 3.5 - 5.3 mmol/L 4.3 4.3 4.8  Chloride 98 - 110 mmol/L 105 101 102  CO2 20 - 32 mmol/L 27 28 24   Calcium 8.6 - 10.4 mg/dL 9.5 9.1 9.2  Total Protein 6.1 - 8.1 g/dL 7.1 6.8 7.1  Total Bilirubin 0.2 - 1.2 mg/dL 0.5 0.5 0.5  Alkaline Phos 33 - 130 U/L - 59 61  AST 10 - 35 U/L 22 22 13   ALT 6 - 29 U/L 13 14 8      No results found for this or any previous visit (from the past 2160 hour(s)).  -------------------------------------------------------------------------- A&P:  Problem List Items Addressed This Visit    Late onset Alzheimer's disease with behavioral disturbance ([place]) - Primary   Severe protein-calorie malnutrition ([place])     Chronic progressive decline w/ Alzheimer's dementia, clinically FAST Stage 6 to 7 - she has worsening weight loss anorexia meal refusing at times, and declined verbal communication, mostly requires assistance for all ADLs and has worsening wandering that may increase risk to patient. -BMI  down to 17 = Weight down 5 lbs, from 95 down to 90 lbs in past >6 months - Increased caregiver burden, limited financial and availability, previously completed home health but limited benefit in past - Meds: prior on Aricept, now off  Plan: 1. Referral to Home Hospice evaluation - anticipate she would meet admission criteria based on weight loss, anorexia, severe protein calorie malnutrition and progressive dementia at this point - [facility] fax [facility] - Ultimately if any additional resources needed will anticipate referral to CCM Nurse Case Manager / Social work  Advised her about strict follow-up return criteria if any severe worsening or new symptoms, when to go to hospital or contact us back, Limited ability to come in to clinic due to patient's health  - Follow-up as needed, if dramatic worsening or concern for her own safety then she should go to hospital ED for evaluation  No orders of the defined types were placed in this encounter.   Follow-up: - Return as needed for dementia  Patient verbalizes understanding with the above medical recommendations including the limitation of remote medical advice.  Specific follow-up and call-back  criteria were given for patient to follow-up or seek medical care more urgently if needed.  - Time spent in direct consultation with patient on phone: 15 minutes  Nobie Putnam, [facility] 08/03/2018, 9:01 AM  ---------------------  UPDATE 3:51pm 08/03/18 - same day as apt listed above - received fax update for patient's status being admitted to hospice now by [facility], signed all admission forms and medication protocol standing orders, agreed to include morphine in her hospice med kit, it will be faxed tomorrow 08/04/18 - called hospice to confirm, they will expedite delivery as necessary to get medicines to her when needed.  Nobie Putnam, [facility] 08/03/2018, 3:52 PM

## 2018-08-03 NOTE — Patient Instructions (Addendum)
Not on mychart. AVS info given over phone.  Referral to hospice.  Brewing technologist.Gerre Scull / [phone]  [address] [place], [place] [address] Ph: [phone]

## 2018-08-04 DIAGNOSIS — I10 Essential (primary) hypertension: Secondary | ICD-10-CM | POA: Diagnosis not present

## 2018-08-04 DIAGNOSIS — F0281 Dementia in other diseases classified elsewhere with behavioral disturbance: Secondary | ICD-10-CM | POA: Diagnosis not present

## 2018-08-04 DIAGNOSIS — E039 Hypothyroidism, unspecified: Secondary | ICD-10-CM | POA: Diagnosis not present

## 2018-08-04 DIAGNOSIS — K219 Gastro-esophageal reflux disease without esophagitis: Secondary | ICD-10-CM | POA: Diagnosis not present

## 2018-08-04 DIAGNOSIS — R634 Abnormal weight loss: Secondary | ICD-10-CM | POA: Diagnosis not present

## 2018-08-04 DIAGNOSIS — G301 Alzheimer's disease with late onset: Secondary | ICD-10-CM | POA: Diagnosis not present

## 2018-08-14 DIAGNOSIS — R634 Abnormal weight loss: Secondary | ICD-10-CM | POA: Diagnosis not present

## 2018-08-14 DIAGNOSIS — E039 Hypothyroidism, unspecified: Secondary | ICD-10-CM | POA: Diagnosis not present

## 2018-08-14 DIAGNOSIS — G301 Alzheimer's disease with late onset: Secondary | ICD-10-CM | POA: Diagnosis not present

## 2018-08-14 DIAGNOSIS — I10 Essential (primary) hypertension: Secondary | ICD-10-CM | POA: Diagnosis not present

## 2018-08-14 DIAGNOSIS — K219 Gastro-esophageal reflux disease without esophagitis: Secondary | ICD-10-CM | POA: Diagnosis not present

## 2018-08-14 DIAGNOSIS — J3800 Paralysis of vocal cords and larynx, unspecified: Secondary | ICD-10-CM | POA: Diagnosis not present

## 2018-08-14 DIAGNOSIS — F0281 Dementia in other diseases classified elsewhere with behavioral disturbance: Secondary | ICD-10-CM | POA: Diagnosis not present

## 2018-09-03 DIAGNOSIS — H6123 Impacted cerumen, bilateral: Secondary | ICD-10-CM | POA: Diagnosis not present

## 2018-09-11 DIAGNOSIS — I10 Essential (primary) hypertension: Secondary | ICD-10-CM | POA: Diagnosis not present

## 2018-09-11 DIAGNOSIS — E039 Hypothyroidism, unspecified: Secondary | ICD-10-CM | POA: Diagnosis not present

## 2018-09-11 DIAGNOSIS — F0281 Dementia in other diseases classified elsewhere with behavioral disturbance: Secondary | ICD-10-CM | POA: Diagnosis not present

## 2018-09-11 DIAGNOSIS — R634 Abnormal weight loss: Secondary | ICD-10-CM | POA: Diagnosis not present

## 2018-09-11 DIAGNOSIS — K219 Gastro-esophageal reflux disease without esophagitis: Secondary | ICD-10-CM | POA: Diagnosis not present

## 2018-09-11 DIAGNOSIS — G301 Alzheimer's disease with late onset: Secondary | ICD-10-CM | POA: Diagnosis not present

## 2018-09-14 DIAGNOSIS — G301 Alzheimer's disease with late onset: Secondary | ICD-10-CM | POA: Diagnosis not present

## 2018-09-14 DIAGNOSIS — J3800 Paralysis of vocal cords and larynx, unspecified: Secondary | ICD-10-CM | POA: Diagnosis not present

## 2018-09-14 DIAGNOSIS — R634 Abnormal weight loss: Secondary | ICD-10-CM | POA: Diagnosis not present

## 2018-09-14 DIAGNOSIS — I10 Essential (primary) hypertension: Secondary | ICD-10-CM | POA: Diagnosis not present

## 2018-09-14 DIAGNOSIS — E039 Hypothyroidism, unspecified: Secondary | ICD-10-CM | POA: Diagnosis not present

## 2018-09-14 DIAGNOSIS — F0281 Dementia in other diseases classified elsewhere with behavioral disturbance: Secondary | ICD-10-CM | POA: Diagnosis not present

## 2018-09-14 DIAGNOSIS — K219 Gastro-esophageal reflux disease without esophagitis: Secondary | ICD-10-CM | POA: Diagnosis not present

## 2018-09-15 DIAGNOSIS — G301 Alzheimer's disease with late onset: Secondary | ICD-10-CM | POA: Diagnosis not present

## 2018-09-15 DIAGNOSIS — R634 Abnormal weight loss: Secondary | ICD-10-CM | POA: Diagnosis not present

## 2018-09-15 DIAGNOSIS — K219 Gastro-esophageal reflux disease without esophagitis: Secondary | ICD-10-CM | POA: Diagnosis not present

## 2018-09-15 DIAGNOSIS — I10 Essential (primary) hypertension: Secondary | ICD-10-CM | POA: Diagnosis not present

## 2018-09-15 DIAGNOSIS — E039 Hypothyroidism, unspecified: Secondary | ICD-10-CM | POA: Diagnosis not present

## 2018-09-15 DIAGNOSIS — F0281 Dementia in other diseases classified elsewhere with behavioral disturbance: Secondary | ICD-10-CM | POA: Diagnosis not present

## 2018-09-18 DIAGNOSIS — I10 Essential (primary) hypertension: Secondary | ICD-10-CM | POA: Diagnosis not present

## 2018-09-18 DIAGNOSIS — E039 Hypothyroidism, unspecified: Secondary | ICD-10-CM | POA: Diagnosis not present

## 2018-09-18 DIAGNOSIS — R634 Abnormal weight loss: Secondary | ICD-10-CM | POA: Diagnosis not present

## 2018-09-18 DIAGNOSIS — K219 Gastro-esophageal reflux disease without esophagitis: Secondary | ICD-10-CM | POA: Diagnosis not present

## 2018-09-18 DIAGNOSIS — G301 Alzheimer's disease with late onset: Secondary | ICD-10-CM | POA: Diagnosis not present

## 2018-09-18 DIAGNOSIS — F0281 Dementia in other diseases classified elsewhere with behavioral disturbance: Secondary | ICD-10-CM | POA: Diagnosis not present

## 2018-09-21 DIAGNOSIS — F0281 Dementia in other diseases classified elsewhere with behavioral disturbance: Secondary | ICD-10-CM | POA: Diagnosis not present

## 2018-09-21 DIAGNOSIS — K219 Gastro-esophageal reflux disease without esophagitis: Secondary | ICD-10-CM | POA: Diagnosis not present

## 2018-09-21 DIAGNOSIS — R634 Abnormal weight loss: Secondary | ICD-10-CM | POA: Diagnosis not present

## 2018-09-21 DIAGNOSIS — E039 Hypothyroidism, unspecified: Secondary | ICD-10-CM | POA: Diagnosis not present

## 2018-09-21 DIAGNOSIS — I10 Essential (primary) hypertension: Secondary | ICD-10-CM | POA: Diagnosis not present

## 2018-09-21 DIAGNOSIS — G301 Alzheimer's disease with late onset: Secondary | ICD-10-CM | POA: Diagnosis not present

## 2018-09-25 DIAGNOSIS — E039 Hypothyroidism, unspecified: Secondary | ICD-10-CM | POA: Diagnosis not present

## 2018-09-25 DIAGNOSIS — F0281 Dementia in other diseases classified elsewhere with behavioral disturbance: Secondary | ICD-10-CM | POA: Diagnosis not present

## 2018-09-25 DIAGNOSIS — I10 Essential (primary) hypertension: Secondary | ICD-10-CM | POA: Diagnosis not present

## 2018-09-25 DIAGNOSIS — K219 Gastro-esophageal reflux disease without esophagitis: Secondary | ICD-10-CM | POA: Diagnosis not present

## 2018-09-25 DIAGNOSIS — G301 Alzheimer's disease with late onset: Secondary | ICD-10-CM | POA: Diagnosis not present

## 2018-09-25 DIAGNOSIS — R634 Abnormal weight loss: Secondary | ICD-10-CM | POA: Diagnosis not present

## 2018-09-29 DIAGNOSIS — F0281 Dementia in other diseases classified elsewhere with behavioral disturbance: Secondary | ICD-10-CM | POA: Diagnosis not present

## 2018-09-29 DIAGNOSIS — G301 Alzheimer's disease with late onset: Secondary | ICD-10-CM | POA: Diagnosis not present

## 2018-09-29 DIAGNOSIS — R634 Abnormal weight loss: Secondary | ICD-10-CM | POA: Diagnosis not present

## 2018-09-29 DIAGNOSIS — E039 Hypothyroidism, unspecified: Secondary | ICD-10-CM | POA: Diagnosis not present

## 2018-09-29 DIAGNOSIS — K219 Gastro-esophageal reflux disease without esophagitis: Secondary | ICD-10-CM | POA: Diagnosis not present

## 2018-09-29 DIAGNOSIS — I10 Essential (primary) hypertension: Secondary | ICD-10-CM | POA: Diagnosis not present

## 2018-10-06 DIAGNOSIS — E039 Hypothyroidism, unspecified: Secondary | ICD-10-CM | POA: Diagnosis not present

## 2018-10-06 DIAGNOSIS — I10 Essential (primary) hypertension: Secondary | ICD-10-CM | POA: Diagnosis not present

## 2018-10-06 DIAGNOSIS — F0281 Dementia in other diseases classified elsewhere with behavioral disturbance: Secondary | ICD-10-CM | POA: Diagnosis not present

## 2018-10-06 DIAGNOSIS — K219 Gastro-esophageal reflux disease without esophagitis: Secondary | ICD-10-CM | POA: Diagnosis not present

## 2018-10-06 DIAGNOSIS — R634 Abnormal weight loss: Secondary | ICD-10-CM | POA: Diagnosis not present

## 2018-10-06 DIAGNOSIS — G301 Alzheimer's disease with late onset: Secondary | ICD-10-CM | POA: Diagnosis not present

## 2018-10-12 DIAGNOSIS — R634 Abnormal weight loss: Secondary | ICD-10-CM | POA: Diagnosis not present

## 2018-10-12 DIAGNOSIS — F0281 Dementia in other diseases classified elsewhere with behavioral disturbance: Secondary | ICD-10-CM | POA: Diagnosis not present

## 2018-10-12 DIAGNOSIS — G301 Alzheimer's disease with late onset: Secondary | ICD-10-CM | POA: Diagnosis not present

## 2018-10-12 DIAGNOSIS — K219 Gastro-esophageal reflux disease without esophagitis: Secondary | ICD-10-CM | POA: Diagnosis not present

## 2018-10-12 DIAGNOSIS — I10 Essential (primary) hypertension: Secondary | ICD-10-CM | POA: Diagnosis not present

## 2018-10-12 DIAGNOSIS — E039 Hypothyroidism, unspecified: Secondary | ICD-10-CM | POA: Diagnosis not present

## 2018-10-14 DIAGNOSIS — K219 Gastro-esophageal reflux disease without esophagitis: Secondary | ICD-10-CM | POA: Diagnosis not present

## 2018-10-14 DIAGNOSIS — I10 Essential (primary) hypertension: Secondary | ICD-10-CM | POA: Diagnosis not present

## 2018-10-14 DIAGNOSIS — G301 Alzheimer's disease with late onset: Secondary | ICD-10-CM | POA: Diagnosis not present

## 2018-10-14 DIAGNOSIS — F0281 Dementia in other diseases classified elsewhere with behavioral disturbance: Secondary | ICD-10-CM | POA: Diagnosis not present

## 2018-10-14 DIAGNOSIS — E039 Hypothyroidism, unspecified: Secondary | ICD-10-CM | POA: Diagnosis not present

## 2018-10-14 DIAGNOSIS — R634 Abnormal weight loss: Secondary | ICD-10-CM | POA: Diagnosis not present

## 2018-10-14 DIAGNOSIS — J3800 Paralysis of vocal cords and larynx, unspecified: Secondary | ICD-10-CM | POA: Diagnosis not present

## 2018-10-15 DIAGNOSIS — R634 Abnormal weight loss: Secondary | ICD-10-CM | POA: Diagnosis not present

## 2018-10-15 DIAGNOSIS — K219 Gastro-esophageal reflux disease without esophagitis: Secondary | ICD-10-CM | POA: Diagnosis not present

## 2018-10-15 DIAGNOSIS — F0281 Dementia in other diseases classified elsewhere with behavioral disturbance: Secondary | ICD-10-CM | POA: Diagnosis not present

## 2018-10-15 DIAGNOSIS — E039 Hypothyroidism, unspecified: Secondary | ICD-10-CM | POA: Diagnosis not present

## 2018-10-15 DIAGNOSIS — G301 Alzheimer's disease with late onset: Secondary | ICD-10-CM | POA: Diagnosis not present

## 2018-10-15 DIAGNOSIS — I10 Essential (primary) hypertension: Secondary | ICD-10-CM | POA: Diagnosis not present

## 2018-10-19 DIAGNOSIS — R634 Abnormal weight loss: Secondary | ICD-10-CM | POA: Diagnosis not present

## 2018-10-19 DIAGNOSIS — F0281 Dementia in other diseases classified elsewhere with behavioral disturbance: Secondary | ICD-10-CM | POA: Diagnosis not present

## 2018-10-19 DIAGNOSIS — I10 Essential (primary) hypertension: Secondary | ICD-10-CM | POA: Diagnosis not present

## 2018-10-19 DIAGNOSIS — E039 Hypothyroidism, unspecified: Secondary | ICD-10-CM | POA: Diagnosis not present

## 2018-10-19 DIAGNOSIS — G301 Alzheimer's disease with late onset: Secondary | ICD-10-CM | POA: Diagnosis not present

## 2018-10-19 DIAGNOSIS — K219 Gastro-esophageal reflux disease without esophagitis: Secondary | ICD-10-CM | POA: Diagnosis not present

## 2018-10-21 DIAGNOSIS — I10 Essential (primary) hypertension: Secondary | ICD-10-CM | POA: Diagnosis not present

## 2018-10-21 DIAGNOSIS — G301 Alzheimer's disease with late onset: Secondary | ICD-10-CM | POA: Diagnosis not present

## 2018-10-21 DIAGNOSIS — F0281 Dementia in other diseases classified elsewhere with behavioral disturbance: Secondary | ICD-10-CM | POA: Diagnosis not present

## 2018-10-21 DIAGNOSIS — R634 Abnormal weight loss: Secondary | ICD-10-CM | POA: Diagnosis not present

## 2018-10-21 DIAGNOSIS — K219 Gastro-esophageal reflux disease without esophagitis: Secondary | ICD-10-CM | POA: Diagnosis not present

## 2018-10-21 DIAGNOSIS — E039 Hypothyroidism, unspecified: Secondary | ICD-10-CM | POA: Diagnosis not present

## 2018-10-22 DIAGNOSIS — E039 Hypothyroidism, unspecified: Secondary | ICD-10-CM | POA: Diagnosis not present

## 2018-10-22 DIAGNOSIS — G301 Alzheimer's disease with late onset: Secondary | ICD-10-CM | POA: Diagnosis not present

## 2018-10-22 DIAGNOSIS — R634 Abnormal weight loss: Secondary | ICD-10-CM | POA: Diagnosis not present

## 2018-10-22 DIAGNOSIS — I10 Essential (primary) hypertension: Secondary | ICD-10-CM | POA: Diagnosis not present

## 2018-10-22 DIAGNOSIS — F0281 Dementia in other diseases classified elsewhere with behavioral disturbance: Secondary | ICD-10-CM | POA: Diagnosis not present

## 2018-10-22 DIAGNOSIS — K219 Gastro-esophageal reflux disease without esophagitis: Secondary | ICD-10-CM | POA: Diagnosis not present

## 2018-10-26 DIAGNOSIS — R634 Abnormal weight loss: Secondary | ICD-10-CM | POA: Diagnosis not present

## 2018-10-26 DIAGNOSIS — G301 Alzheimer's disease with late onset: Secondary | ICD-10-CM | POA: Diagnosis not present

## 2018-10-26 DIAGNOSIS — I10 Essential (primary) hypertension: Secondary | ICD-10-CM | POA: Diagnosis not present

## 2018-10-26 DIAGNOSIS — K219 Gastro-esophageal reflux disease without esophagitis: Secondary | ICD-10-CM | POA: Diagnosis not present

## 2018-10-26 DIAGNOSIS — F0281 Dementia in other diseases classified elsewhere with behavioral disturbance: Secondary | ICD-10-CM | POA: Diagnosis not present

## 2018-10-26 DIAGNOSIS — E039 Hypothyroidism, unspecified: Secondary | ICD-10-CM | POA: Diagnosis not present

## 2018-10-27 ENCOUNTER — Telehealth: Payer: Self-pay | Admitting: Family Medicine

## 2018-10-27 NOTE — Telephone Encounter (Signed)
See prior documentation on clinical notes  Update now today 10/27/18 received call from hospice medical director Jewel Baize MD about patient on hospice services. Braylynn has demonstrated improvement with her wt gain initially lost 95 to 90 lbs and declined with protein calorie malnutrition among other issues dementia alzheimer's and recurrent falls. She has now improved wt gain up to 105 lbs and overall gained improved muscle mass, she has had improved cognitive function as well, still with alzheimer's and some behavioral changes but now has caregiver/sitter and doing better. She will be discharged off Hospice, no longer meets criteria on recert. She will be discharged on Friday, and will transition to palliative care services [facility], they will f/u with her. Verbal orders given.  If worsening dementia again and progress to FAST stage 7. Or worsening weight gain or recurrent issues may resume hospice care in future.  Nobie Putnam, [facility] 10/27/2018, 2:25 PM

## 2018-10-28 DIAGNOSIS — I10 Essential (primary) hypertension: Secondary | ICD-10-CM | POA: Diagnosis not present

## 2018-10-28 DIAGNOSIS — E039 Hypothyroidism, unspecified: Secondary | ICD-10-CM | POA: Diagnosis not present

## 2018-10-28 DIAGNOSIS — G301 Alzheimer's disease with late onset: Secondary | ICD-10-CM | POA: Diagnosis not present

## 2018-10-28 DIAGNOSIS — R634 Abnormal weight loss: Secondary | ICD-10-CM | POA: Diagnosis not present

## 2018-10-28 DIAGNOSIS — F0281 Dementia in other diseases classified elsewhere with behavioral disturbance: Secondary | ICD-10-CM | POA: Diagnosis not present

## 2018-10-28 DIAGNOSIS — K219 Gastro-esophageal reflux disease without esophagitis: Secondary | ICD-10-CM | POA: Diagnosis not present

## 2018-10-29 DIAGNOSIS — F0281 Dementia in other diseases classified elsewhere with behavioral disturbance: Secondary | ICD-10-CM | POA: Diagnosis not present

## 2018-10-29 DIAGNOSIS — K219 Gastro-esophageal reflux disease without esophagitis: Secondary | ICD-10-CM | POA: Diagnosis not present

## 2018-10-29 DIAGNOSIS — I10 Essential (primary) hypertension: Secondary | ICD-10-CM | POA: Diagnosis not present

## 2018-10-29 DIAGNOSIS — E039 Hypothyroidism, unspecified: Secondary | ICD-10-CM | POA: Diagnosis not present

## 2018-10-29 DIAGNOSIS — R634 Abnormal weight loss: Secondary | ICD-10-CM | POA: Diagnosis not present

## 2018-10-29 DIAGNOSIS — G301 Alzheimer's disease with late onset: Secondary | ICD-10-CM | POA: Diagnosis not present

## 2018-11-03 ENCOUNTER — Encounter: Payer: Self-pay | Admitting: Family Medicine

## 2018-11-03 ENCOUNTER — Ambulatory Visit (INDEPENDENT_AMBULATORY_CARE_PROVIDER_SITE_OTHER): Payer: Medicare Other | Admitting: Family Medicine

## 2018-11-03 ENCOUNTER — Other Ambulatory Visit: Payer: Self-pay

## 2018-11-03 VITALS — BP 154/76 | HR 62 | Temp 97.7°F | Resp 16 | Ht 60.0 in | Wt 106.0 lb

## 2018-11-03 DIAGNOSIS — E43 Unspecified severe protein-calorie malnutrition: Secondary | ICD-10-CM

## 2018-11-03 DIAGNOSIS — Z515 Encounter for palliative care: Secondary | ICD-10-CM

## 2018-11-03 DIAGNOSIS — G301 Alzheimer's disease with late onset: Secondary | ICD-10-CM

## 2018-11-03 DIAGNOSIS — E063 Autoimmune thyroiditis: Secondary | ICD-10-CM

## 2018-11-03 DIAGNOSIS — E038 Other specified hypothyroidism: Secondary | ICD-10-CM

## 2018-11-03 DIAGNOSIS — F0281 Dementia in other diseases classified elsewhere with behavioral disturbance: Secondary | ICD-10-CM

## 2018-11-03 NOTE — Patient Instructions (Addendum)
Thank you for coming to the office today.  Lab tests today for nutrition today, determine if any new criteria for hospice  Check thyroid, will try to re adjust dose or restart this med to help.  New referral to Chronic Care Management team - Merlene Morse / Jerene Pitch - they will contact you by phone this week to discuss what they have to offer.  Stay tuned for [facility] contact - if they do not start this week then you can call us or them to check status of this.  Keep trying to improve nutrition  Monitor weight at home, keep track.  Please schedule a Follow-up Appointment to: Return in about 6 weeks (around 12/15/2018) for 6 weeks dementia / weight check / palliatiave.  If you have any other questions or concerns, please feel free to call the office or send a message through [place]. You may also schedule an earlier appointment if necessary.  Additionally, you may be receiving a survey about your experience at our office within a few days to 1 week by e-mail or mail. We value your feedback.  Nobie Putnam, DO [place]

## 2018-11-03 NOTE — Assessment & Plan Note (Signed)
Weight gain up to 16 lbs in 3 months Question with her history of limited PO intake nutrition still but seems to improve somewhat, has gained some muscle mass  Plan Continue ensure, nutrition support,  Meals on wheels Check labs for albumin level now to determine nutrition level Monitor home weight Follow-up w/ palliative vs future hospice if wt loss again

## 2018-11-03 NOTE — Assessment & Plan Note (Signed)
Chronic hypothyroidism, poorly controlled in past due to med adherence Last result in 2019 uncontrolled Deferred labs in interval due to caregiver/patient wishes, and was on hospice care  Now today will re-check TSH Free T4 to assess thyroid control Mostly off medication now Re-adjust dose upon review of result and notify patient/s caregiver

## 2018-11-03 NOTE — Assessment & Plan Note (Signed)
Awaiting initial palliative provider intake apt Discharged from [place] given last week to transition to Palliative consult  Anticipate patient may be candidate for hospice again in near future.

## 2018-11-03 NOTE — Assessment & Plan Note (Signed)
Recently discharged from Hospice due to wt gain Does not meet hospice criteria based on dementia at this time  Chronic progressive decline w/ Alzheimer's dementia, clinically FAST Stage 6 - At risk of progression to FAST 7 but still has some retained verbal communication Increasing assistance with ADL needed and worsening behavior and wandering, may increase risk to patient. - Increased caregiver burden, limited financial and availability - Meds: prior on Aricept, now off  Plan: 1. Encourage continue providing dementia support as able at home, has additional caregiver support but very limited - Referral to CCM Nurse Case Management / Social work, see order below  Patient's caregiver may benefit from respite care if available  - Follow-up as needed, if dramatic worsening or concern for her own safety then she should go to hospital ED for evaluation

## 2018-11-03 NOTE — Progress Notes (Signed)
Subjective:    Patient ID: Megan RedderBarbara J Carpenter, female    DOB: [birth year], 85 y.o.   MRN: [id number]  Megan Carpenter is a 85 y.o. female presenting on 11/03/2018 for Weight Gain (don't know the reason why she is gaining weight discharged from hospice) and Dementia   HPI   Daughter Megan Carpenter provided history. Patient has dementia and limited verbal input.  DEMENTIA, with behavior disturbances Severe Protein Calorie Malnutrition with Weight Loss - Last visit with 07/2018,reviewedsame problemwithChronic dementia, Alzheimer's type, progressive decline, primarily managed by daughter as caregiver, see prior notes for background information. - Interval update with patient admitted to [facility] in 07/2018, she benefited from home hospice services for 3 months and improved under their care, she gained weight over past 3 months up to 16 lbs from 90 lbs up to 106 lbs, however there was some concern of some possible fluid weight or constipation weight as well. - Now she was recently discharged from hospice in July 2020 due to weight gain and improvement. Her dementia does not meet criteria to keep her admitted to hospice at this time. Hospice advised that if she continued to lose weight again with poor PO intake, or also worsening dementia beyond FAST stage 7 she could be readmit, they attempted to setup palliative care on her discharge, but they have not heard back yet - She has had significant difficulty in past with access to other avenues of care, her primary caregiver daughter has tried to get more home health, ALF, SNF but unable to proceed due to financial barriers  Today she is here for repeat evaluation after DC from hospice.  Caregiver says uncertain cause for weight gain, because she still is taking poor PO, she has done ensure but not regularly, she is taking in small amounts of foods but very limited by her report, often she is feeding her pet dog her food. - Did have history of distended  abdomen in past, but seems to come and go, she has some bloating abdominal but no endorsed significant constipation or abdominal pain - Caregiver stress is significant and reported today - Inconsistentency of medications, not adhering, off levothyroxine now, she is asking for repeat lab, for thyroid level - Still has progressive decline in dementia, with worsening comprehension difficulty, still able to verbalize limited input - Occasional wandering outside, has a vigilant neighbor that is able to help - She has a lady to come spend some time with her few days a week, Tues / Friday and some on Saturday - Family is caring for her dog as well - She has meals on wheels, frozen mealsnot as helpful as warm meals - She cannot perform ADLs, request assistance with most tasks, including bathing - she declines to shower often or will say she has but hasn't. She refuses help with this ADL. She cannot do meal prep. Often requires assistance with feeding. - Regarding communication - her verbal communication is limited, reduced words and phrases, she often has difficulty comprehending tasks or requests. - Admits some behavioral changesworsening with wandering and confusion episodes, no violent behavior - Denies significant mood changes, depression, no harm to others, dysuria, hematuria, nausea vomiting , fever chills   Dep[facility] screen [facility] 2[facility] 11/03/2018 03/30/2018 09/25/2017  Decreased Interest 0 0 0  Down, Depressed, Hopeless 0 0 0  PHQ - 2 Score 0 0 0    Social History   Tobacco Use  . Smoking status: Never Smoker  . Smokeless tobacco: Never Used  Substance Use Topics  . Alcohol use: No  . Drug use: No    Review of Systems Per HPI unless specifically indicated above     Objective:    BP (!) 154/76   Pulse 62   Temp 97.7 F (36.5 C)   Resp 16   Ht 5' (1.524 m)   Wt 106 lb (48.1 kg)   SpO2 98%   BMI 20.70 kg/m   Wt Readings from Last 3 Encounters:  11/03/18 106 lb (48.1 kg)   08/03/18 90 lb (40.8 kg)  03/30/18 93 lb 6.4 oz (42.4 kg)    Physical Exam Vitals signs and nursing note reviewed.  Constitutional:      General: She is not in acute distress.    Appearance: She is well-developed. She is not diaphoretic.     Comments: Thin appearing, mostly comfortable, cooperative, has gained some weight from last visit  HENT:     Head: Normocephalic and atraumatic.  Eyes:     General:        Right eye: No discharge.        Left eye: No discharge.     Conjunctiva/sclera: Conjunctivae normal.  Neck:     Musculoskeletal: Normal range of motion and neck supple.  Cardiovascular:     Rate and Rhythm: Normal rate and regular rhythm.     Heart sounds: Normal heart sounds. No murmur.  Pulmonary:     Effort: Pulmonary effort is normal. No respiratory distress.     Breath sounds: Normal breath sounds. No wheezing or rales.  Abdominal:     General: There is no distension.     Palpations: Abdomen is soft. There is no mass.     Tenderness: There is no abdominal tenderness.     Comments: Hyperactive bowel sounds  Musculoskeletal: Normal range of motion.     Comments: Muscle atrophy and muscle wasting facial and generalized  Lymphadenopathy:     Cervical: No cervical adenopathy.  Skin:    General: Skin is warm and dry.     Findings: No erythema or rash.  Neurological:     Mental Status: She is alert.     Comments: Disoriented  Psychiatric:        Behavior: Behavior normal.     Comments: Hygiene is average, well dressed, not always making eye contact, she is verbal but has limited speech, only few words, partial answer response only. She has poor insight into her medical condition and mental health. Follows some commands.     Results for orders placed or performed in visit on 06/20/17  Carboxyhemoglobin  Result Value Ref Range   Carboxyhemoglobin 3 <=12 %TOTAL HGB  COMPLETE METABOLIC PANEL WITH GFR  Result Value Ref Range   Glucose, Bld 108 (H) 65 - 99 mg/dL    BUN 11 7 - 25 mg/dL   Creat 0.82 0.60 - 0.88 mg/dL   GFR, Est Non African American 65 > OR = 60 mL/min/1.17m2   GFR, Est African American 75 > OR = 60 mL/min/1.76m2   BUN/Creatinine Ratio NOT APPLICABLE 6 - 22 (calc)   Sodium 140 135 - 146 mmol/L   Potassium 4.3 3.5 - 5.3 mmol/L   Chloride 105 98 - 110 mmol/L   CO2 27 20 - 32 mmol/L   Calcium 9.5 8.6 - 10.4 mg/dL   Total Protein 7.1 6.1 - 8.1 g/dL   Albumin 4.4 3.6 - 5.1 g/dL   Globulin 2.7 1.9 - 3.7 g/dL (calc)   AG Ratio  1.6 1.0 - 2.5 (calc)   Total Bilirubin 0.5 0.2 - 1.2 mg/dL   Alkaline phosphatase (APISO) 62 33 - 130 U/L   AST 22 10 - 35 U/L   ALT 13 6 - 29 U/L  TSH  Result Value Ref Range   TSH 59.51 (H) 0.40 - 4.50 mIU/L  T4, free  Result Value Ref Range   Free T4 0.6 (L) 0.8 - 1.8 ng/dL  CBC with Differential/Platelet  Result Value Ref Range   WBC 8.6 3.8 - 10.8 Thousand/uL   RBC 4.95 3.80 - 5.10 Million/uL   Hemoglobin 14.8 11.7 - 15.5 g/dL   HCT 10.243.7 72.535.0 - 36.645.0 %   MCV 88.3 80.0 - 100.0 fL   MCH 29.9 27.0 - 33.0 pg   MCHC 33.9 32.0 - 36.0 g/dL   RDW 44.013.2 34.711.0 - 42.515.0 %   Platelets 237 140 - 400 Thousand/uL   MPV 10.7 7.5 - 12.5 fL   Neutro Abs 6,502 1,500 - 7,800 cells/uL   Lymphs Abs 1,462 850 - 3,900 cells/uL   WBC mixed population 593 200 - 950 cells/uL   Eosinophils Absolute 0 (L) 15 - 500 cells/uL   Basophils Absolute 43 0 - 200 cells/uL   Neutrophils Relative % 75.6 %   Total Lymphocyte 17.0 %   Monocytes Relative 6.9 %   Eosinophils Relative 0.0 %   Basophils Relative 0.5 %  Hemoglobin A1c  Result Value Ref Range   Hgb A1c MFr Bld 5.9 (H) <5.7 % of total Hgb   Mean Plasma Glucose 123 (calc)   eAG (mmol/L) 6.8 (calc)  Vitamin B12  Result Value Ref Range   Vitamin B-12 450 200 - 1,100 pg/mL      Assessment & Plan:   Problem List Items Addressed This Visit    Hypothyroidism    Chronic hypothyroidism, poorly controlled in past due to med adherence Last result in 2019 uncontrolled Deferred  labs in interval due to caregiver/patient wishes, and was on hospice care  Now today will re-check TSH Free T4 to assess thyroid control Mostly off medication now Re-adjust dose upon review of result and notify patient/s caregiver      Relevant Orders   TSH   T4, free   Late onset Alzheimer's disease with behavioral disturbance (HCC) - Primary    Recently discharged from Hospice due to wt gain Does not meet hospice criteria based on dementia at this time  Chronic progressive decline w/ Alzheimer's dementia, clinically FAST Stage 6 - At risk of progression to FAST 7 but still has some retained verbal communication Increasing assistance with ADL needed and worsening behavior and wandering, may increase risk to patient. - Increased caregiver burden, limited financial and availability - Meds: prior on Aricept, now off  Plan: 1. Encourage continue providing dementia support as able at home, has additional caregiver support but very limited - Referral to CCM Nurse Case Management / Social work, see order below  Patient's caregiver may benefit from respite care if available  - Follow-up as needed, if dramatic worsening or concern for her own safety then she should go to hospital ED for evaluation      Relevant Orders   COMPLETE METABOLIC PANEL WITH GFR   Ambulatory referral to Chronic Care Management Services   Palliative care patient    Awaiting initial palliative provider intake apt [facility] Verbal given last week to transition to Palliative consult  Anticipate patient may be candidate for hospice again in near  future.      Relevant Orders   COMPLETE METABOLIC PANEL WITH GFR   TSH   Ambulatory referral to Chronic Care Management Services   Severe protein-calorie malnutrition (HCC)    Weight gain up to 16 lbs in 3 months Question with her history of limited PO intake nutrition still but seems to improve somewhat, has gained some muscle mass  Plan  Continue ensure, nutrition support,  Meals on wheels Check labs for albumin level now to determine nutrition level Monitor home weight Follow-up w/ palliative vs future hospice if wt loss again       Relevant Orders   COMPLETE METABOLIC PANEL WITH GFR   Ambulatory referral to Chronic Care Management Services        No orders of the defined types were placed in this encounter.  Referral to CCM Nurse Case Manager and Social Work for assistance for with patient who was discharged from hospice recently due to weight gain, she was admitted for 3 months 07/2018 to 10/2018, admitted due to dementia progressive worsening some behavioral issues and weight loss malnutrition. Caregiver has significant stress and has tried to seek out any option available, considered SNF / ALF memory care etc but unable to proceed. She would benefit from any type of caregiver stress counseling and any options for respite care or home care options, she has some arrangements but open to any help. We are referring patient to Palliative care now, and monitoring to determine if can re-admit to hospice in near future.  Orders Placed This Encounter  Procedures  . COMPLETE METABOLIC PANEL WITH GFR  . TSH  . T4, free  . Ambulatory referral to Chronic Care Management Services    Referral Priority:   Routine    Referral Type:   Consultation    Referral Reason:   Care Coordination    Number of Visits Requested:   1     Follow up plan: Return in about 6 weeks (around 12/15/2018) for 6 weeks dementia / weight check / palliatiave.   Saralyn PilarAlexander Georgetta Crafton, DO [facility] 11/03/2018, 8:15 AM

## 2018-11-04 LAB — TSH: TSH: >150 mIU/L — ABNORMAL HIGH (ref 0.40–4.50)

## 2018-11-04 LAB — COMPLETE METABOLIC PANEL WITH GFR
AG Ratio: 1.5 (calc) (ref 1.0–2.5)
ALT: 19 U/L (ref 6–29)
AST: 37 U/L — ABNORMAL HIGH (ref 10–35)
Albumin: 4.4 g/dL (ref 3.6–5.1)
Alkaline phosphatase (APISO): 60 U/L (ref 37–153)
BUN/Creatinine Ratio: 22 (calc) (ref 6–22)
BUN: 20 mg/dL (ref 7–25)
CO2: 26 mmol/L (ref 20–32)
Calcium: 8.8 mg/dL (ref 8.6–10.4)
Chloride: 102 mmol/L (ref 98–110)
Creat: 0.91 mg/dL — ABNORMAL HIGH (ref 0.60–0.88)
GFR, Est African American: 66 mL/min/{1.73_m2} (ref >=60)
GFR, Est Non African American: 57 mL/min/{1.73_m2} — ABNORMAL LOW (ref >=60)
Globulin: 2.9 g/dL (calc) (ref 1.9–3.7)
Glucose, Bld: 127 mg/dL — ABNORMAL HIGH (ref 65–99)
Potassium: 4.3 mmol/L (ref 3.5–5.3)
Sodium: 139 mmol/L (ref 135–146)
Total Bilirubin: 0.7 mg/dL (ref 0.2–1.2)
Total Protein: 7.3 g/dL (ref 6.1–8.1)

## 2018-11-04 LAB — T4, FREE: Free T4: 0.2 ng/dL — ABNORMAL LOW (ref 0.8–1.8)

## 2018-11-05 ENCOUNTER — Other Ambulatory Visit: Payer: Self-pay | Admitting: Family Medicine

## 2018-11-05 DIAGNOSIS — E034 Atrophy of thyroid (acquired): Secondary | ICD-10-CM

## 2018-11-05 MED ORDER — LEVOTHYROXINE SODIUM 100 MCG PO TABS: ORAL_TABLET | ORAL | 2 refills | Status: DC

## 2018-11-12 ENCOUNTER — Telehealth: Payer: Self-pay | Admitting: Student

## 2018-11-12 NOTE — Telephone Encounter (Signed)
Rec'd call back from daughter Jocelyn Lamer and we have scheduled a Telephone Palliatve Consult for 11/16/18 @ 8:30 AM.

## 2018-11-12 NOTE — Telephone Encounter (Signed)
Corrected scheduled date to 11/17/18 @ 8:30 AM.

## 2018-11-12 NOTE — Telephone Encounter (Signed)
Contacted patient's daughter Jocelyn Lamer to schedule Palliative Consult, no answer.  Left message with reason for call along with my contact information.

## 2018-11-17 ENCOUNTER — Other Ambulatory Visit: Payer: Medicare Other | Admitting: Student

## 2018-11-17 ENCOUNTER — Other Ambulatory Visit: Payer: Self-pay

## 2018-11-17 DIAGNOSIS — Z515 Encounter for palliative care: Secondary | ICD-10-CM

## 2018-11-17 NOTE — Progress Notes (Signed)
[facility] Consult Note Telephone: [phone]  Fax: [phone]  PATIENT NAME: Megan RedderBarbara J Kem DOB: 12-21-1928 MRN: [id number]  PRIMARY CARE PROVIDER:   Smitty CordsKaramalegos, Alexander J, DO  REFERRING PROVIDER:  Smitty CordsKaramalegos, Alexander J, DO [address] [place],  [place] [id number]  RESPONSIBLE PARTY:  Daughter, Vickie Boltinghouse   ASSESSMENT: Due to the COVID-19 crisis, this visit was done via telemedicine and it was initiated and consent by this patient and or family. Patient was unable to connect via zoom, therefore, visit was conducted via telephone. We discussed goals of care. Daughter would like for her to be safe. We discussed options, long range planning. Palliative SW referral made. We also discussed PACE program as an option. She is going to follow up with camera for safety. We discussed symptom management. Patient with some anxiety/agitation; daughter would like to limit medications at this time. We did discuss olanzapine as an option; she will continue relora calm supplement. Daughter states patient does have a will, POA documentation but was unsure of patient's wishes. We discussed Palliative vs. Hospice services. Daughter is given opportunity to ask questions. Patient is a FAST 6B. Labs 11/03/18: sodium 139, potassium 4.3, chloride 102, CO2 26, glucose 127, BUN 20, creatinine 0.91, calcium 8.8, albumin 4.4, AST 37, ALT 19, gFr 57.          RECOMMENDATIONS and PLAN:  1. Code status: Full Code. 2. Medical goals of therapy: Daughter would like for patient to remain safe in the home. Palliative Care will monitor for changes and declines; will refer back to hospice if patient meets criteria for Dementia: The patient has both 1 and 2: 1. Stage 7C or beyond according to the FAST Scale AND 2. One or more of the following conditions in the 12 months: Aspiration pneumonia, Pyelonephritis, Septicemia, Multiple pressure ulcers ( stage 3-4), Recurrent  Fever, Other significant condition that suggests a limited prognosis,Inability to maintain sufficient fluid and calorie intake in the past 6months ( 10% weight loss or albumin < 2.5 gm/dl). 3. Symptom management: Family is encouraged to monitor for safety. Anxiety/agitation-continue relora calm as directed.   Referral made to Palliative SW to assist with long range planning.   I spent 45 minutes providing this consultation,  from 8:30am to 9:15am. More than 50% of the time in this consultation was spent coordinating communication.   HISTORY OF PRESENT ILLNESS:  Megan RedderBarbara J Moris is a 85 y.o. female with multiple medical problems including alzheimer's dementia, abnormal weight loss, hypertension, hypothyroidism, GERD, paralyzed vocal cords. Palliative Care was asked to help address goals of care. Ms. Dierdre SearlesDoby was recently on Hospice services from 08/03/18-10/30/18 due to abnormal weight loss. She was discharged due to stability and weight gain. She presently lives at home with private care givers 2 days a week, daughter Vickie visits twice a day and also neighbor support. Vickie states patient is wandering more, walking to store and tries to go to her old home. She also "sundowns." Ms. Dierdre SearlesDoby states she is able to complete adl's such as bathing, dressing, grooming. She states she is still continent of bowel and bladder. She reports a good appetite; daughter is uncertain on how much patient actually is eating because she and family members observe different things regarding her intake. Ms. Dierdre SearlesDoby reports sleeping well. She does have anxiety/agitation; she is receiving natural supplement relora calm with good effects per Vickie. Vickie states she has been denied twice for medicaid when trying to place patient in  facility.   CODE STATUS: Full Code  PPS: 50% HOSPICE ELIGIBILITY/DIAGNOSIS: TBD  PAST MEDICAL HISTORY:  Past Medical History:  Diagnosis Date  . GERD (gastroesophageal reflux disease)   . Hypertension   .  Thyroid disease     SOCIAL HX:  Social History   Tobacco Use  . Smoking status: Never Smoker  . Smokeless tobacco: Never Used  Substance Use Topics  . Alcohol use: No    ALLERGIES:  Allergies  Allergen Reactions  . Penicillins Hives     PERTINENT MEDICATIONS:  Outpatient Encounter Medications as of 11/17/2018  Medication Sig  . alendronate (FOSAMAX) 70 MG tablet TAKE ONE TABLET BY MOUTH ONCE A WEEK (Patient not taking: Reported on 11/03/2018)  . levothyroxine (SYNTHROID) 100 MCG tablet Start with half pill (dose 62mcg) daily before breakfast on empty stomach. After 2 weeks, increase to one whole pill (189mcg) dose daily in morning empty stomach.  . losartan (COZAAR) 25 MG tablet TAKE 1/2 TABLET EACH AM (Patient not taking: Reported on 11/03/2018)  . mupirocin ointment (BACTROBAN) 2 % Apply 1 application topically 2 (two) times daily. For 7-10 days (Patient not taking: Reported on 11/03/2018)  . omeprazole (PRILOSEC) 20 MG capsule TAKE 1 CAPSULE BY MOUTH DAILY BEFORE BREAKFAST TAKE FOR 2-4 WEEKS AT A TIME ONLY IF NEEDED FOR WORSENING ACID REFLUX (Patient not taking: Reported on 11/03/2018)   No facility-administered encounter medications on file as of 11/17/2018.     PHYSICAL EXAM:   Physical exam deferred.   Ezekiel Slocumb, NP

## 2018-11-23 ENCOUNTER — Ambulatory Visit: Payer: Medicare Other | Admitting: *Deleted

## 2018-11-23 ENCOUNTER — Telehealth: Payer: Self-pay | Admitting: Family Medicine

## 2018-11-23 ENCOUNTER — Telehealth: Payer: Self-pay | Admitting: Student

## 2018-11-23 DIAGNOSIS — F02818 Dementia in other diseases classified elsewhere, unspecified severity, with other behavioral disturbance: Secondary | ICD-10-CM

## 2018-11-23 DIAGNOSIS — F419 Anxiety disorder, unspecified: Secondary | ICD-10-CM

## 2018-11-23 DIAGNOSIS — F0281 Dementia in other diseases classified elsewhere with behavioral disturbance: Secondary | ICD-10-CM

## 2018-11-23 DIAGNOSIS — Z515 Encounter for palliative care: Secondary | ICD-10-CM

## 2018-11-23 DIAGNOSIS — E034 Atrophy of thyroid (acquired): Secondary | ICD-10-CM

## 2018-11-23 MED ORDER — OLANZAPINE 2.5 MG PO TABS: ORAL_TABLET | ORAL | 0 refills | Status: DC

## 2018-11-23 NOTE — Telephone Encounter (Signed)
Does she still have Donepezil 5mg  (Aricept)?  She was on this medicine in the past, and I believe stopped it because it was not effective for her.  If she would like to resume, let me know I can re order it for her. She can try it to see if it helps.  She has Palliative Care, and should continue to work with them for symptoms.  Nobie Putnam, [facility] 11/23/2018, 3:09 PM

## 2018-11-23 NOTE — Telephone Encounter (Signed)
Pt daughter called wanted to know if her mother could take donepezil  5mg   ( pt is wondering in the neighborhood, walking to [employer])

## 2018-11-23 NOTE — Patient Instructions (Signed)
Thank you allowing the Chronic Care Management Team to be a part of your care! It was a pleasure speaking with you today!   CCM (Chronic Care Management) Team   Kleber Crean RN, BSN Nurse Care Coordinator  [phone]  Harlow Asa PharmD  Clinical Pharmacist  [phone]  Eula Fried LCSW Clinical Social Worker [phone]  Goals Addressed            This Visit's Progress   . RNCM-I need help caring for mom (pt-stated)       Current Barriers:  . Lacks caregiver support.  . Film/video editor.  . Cognitive Deficits . Patient lives alone with daughter and son checking in on her but has started to have increased anxiety and wandering related to dementia  Nurse Case Manager Clinical Goal(s):  Marland Kitchen Over the next 120 days, caregiver will work with [facility] to address needs related to finding assistance for patient in the home.   Interventions:  . Reviewed medications with patient and discussed the caretaker's use of Relora for patient's noted anxiety and mood. Nash Dimmer with PCP and Palliative Care NP regarding recommended medications for patient's increased anxiety and wandering in the evening. Providers agreed olanzapine 2.5mg  to start.(increase dose to 5mg  after 2 weeks if tolerating) PCP to send script for medication to pharmacy. . Discussed plans with patient for ongoing care management follow up and provided patient with direct contact information for care management team . Provided patient and/or caregiver with contact information about [facility] dementia Family Support program which is [facility] in [facility] . Discussed with daughter ways to increase safety in the home.  . Placed a call back to daughter [name] to make her aware of medication order,  left a detailed message and requested a call back but daughter did not call back.  . Will attempt again tomorrow.    Patient Self Care Activities:  . Currently UNABLE TO independently complete ADLs  or IDALs  Initial goal documentation        The patient verbalized understanding of instructions provided today and declined a print copy of patient instruction materials.   The patient has been provided with contact information for the care management team and has been advised to call with any health related questions or concerns.

## 2018-11-23 NOTE — Chronic Care Management (AMB) (Signed)
Chronic Care Management   Initial Visit Note  11/23/2018 Name: Megan Carpenter MRN: [id number] DOB: 09/30/1928  Referred by: Olin Hauser, DO Reason for referral : No chief complaint on file.   Megan Carpenter is a 85 y.o. year old female who is a primary care patient of Olin Hauser, DO. The CCM team was consulted for assistance with chronic disease management and care coordination needs.   Review of patient status, including review of consultants reports, relevant laboratory and other test results, and collaboration with appropriate care team members and the patient's provider was performed as part of comprehensive patient evaluation and provision of chronic care management services.    SDOH (Social Determinants of Health) screening performed today. See Care Plan Entry related to challenges with: Stress, anxiety and caregiver strain.  Subjective: Initial call to patient's daughter/ caregiver Megan Carpenter. Megan Carpenter describes increased caregiver strain related to patient's increased dementia behaviors such as wandering out of her home into the neighborhood. Daughter states she lives close by her mother's home, and neighbors also watch over her mother too. She states her mother fell today but did not have an injury. Palliative care involved. Patient was just discharged from Hospice services because she did not meet hospice criteria.     Objective:   Goals Addressed            This Visit's Progress   . RNCM-I need help caring for mom (pt-stated)       Current Barriers:  . Lacks caregiver support.  . Film/video editor.  . Cognitive Deficits . Patient lives alone with daughter and son checking in on her but has started to have increased anxiety and wandering related to dementia  Nurse Case Manager Clinical Goal(s):  Marland Kitchen Over the next 120 days, caregiver will work with [facility] to address needs related to finding assistance for patient in the home.   Interventions:  .  Reviewed medications with patient and discussed the caretaker's use of Relora for patient's noted anxiety and mood. Nash Dimmer with PCP and Palliative Care NP regarding recommended medications for patient's increased anxiety and wandering in the evening. Providers agreed olanzapine 2.'5mg'$  to start.(increase dose to '5mg'$  after 2 weeks if tolerating) PCP to send script for medication to pharmacy. . Discussed plans with patient for ongoing care management follow up and provided patient with direct contact information for care management team . Provided patient and/or caregiver with contact information about [facility] dementia Family Support program which is [facility] in [facility] ([employer] . Discussed with daughter ways to increase safety in the home.  . Placed a call back to daughter Megan Carpenter to make her aware of medication order,  left a detailed message and requested a call back but daughter did not call back.  . Will attempt again tomorrow.    Patient Self Care Activities:  . Currently UNABLE TO independently complete ADLs or IDALs  Initial goal documentation         Ms. Shinault was given information about Chronic Care Management services today including:  1. CCM service includes personalized support from designated clinical staff supervised by her physician, including individualized plan of care and coordination with other care providers 2. 24/7 contact phone numbers for assistance for urgent and routine care needs. 3. Service will only be billed when office clinical staff spend 20 minutes or more in a month to coordinate care. 4. Only one practitioner may furnish and bill the service in a calendar month. 5. The patient may  stop CCM services at any time (effective at the end of the month) by phone call to the office staff. 6. The patient will be responsible for cost sharing (co-pay) of up to 20% of the service fee (after annual deductible is met).  Patient's caregiver agreed to services  and verbal consent obtained.   Plan:   The care management team will reach out to the patient again over the next 14 days.  The patient has been provided with contact information for the care management team and has been advised to call with any health related questions or concerns.   Merlene Morse Oza Oberle RN, BSN Nurse Case Pharmacist, [facility]  [phone]) Business [employer]

## 2018-11-23 NOTE — Telephone Encounter (Signed)
Spoke with CCM Nurse Case Manager Janci Minor LPN today, [birth year] after she had phone call visit with [name] (patient daughter). Patient also had palliative care visit on 11/17/18. They recommended olanzapine for anxiety at that time, but daughter declined medicine.  Now it sounds like daughter is interested in medicine to help her mother's anxiety with her dementia.  In discussion with palliative / CCM - we mutually agreed on trial of Olanzapine 2.5mg  nightly at 6pm - after 2 weeks can increase up to 2 pills for 5mg  daily.  New rx sent.  Daughter notified by CCM Janci today.  Will HOLD off on Donepezil (Aricept) at this time, as I believe there is limited benefit for her dementia on this medicine.  Will follow up with Palliative / CCM and still pursue further increased care options such as PACE program or other option for 24 hour care.  Nobie Putnam, [facility] 11/23/2018, 5:07 PM

## 2018-11-23 NOTE — Telephone Encounter (Signed)
Palliative NP received call from case manager Janci. We discussed patient and discussed adding olanzapine 2.5mg  due to patient having worsening anxiety/agitation per her conversation with daughter [name]. We also discussed SW referral and other interventions/ resources for patient.

## 2018-11-23 NOTE — Telephone Encounter (Signed)
Left message for patient to call back  

## 2018-11-24 ENCOUNTER — Ambulatory Visit (INDEPENDENT_AMBULATORY_CARE_PROVIDER_SITE_OTHER): Payer: Medicare Other | Admitting: *Deleted

## 2018-11-24 DIAGNOSIS — G301 Alzheimer's disease with late onset: Secondary | ICD-10-CM | POA: Diagnosis not present

## 2018-11-24 DIAGNOSIS — F02818 Dementia in other diseases classified elsewhere, unspecified severity, with other behavioral disturbance: Secondary | ICD-10-CM

## 2018-11-24 DIAGNOSIS — F0281 Dementia in other diseases classified elsewhere with behavioral disturbance: Secondary | ICD-10-CM

## 2018-11-24 NOTE — Chronic Care Management (AMB) (Signed)
  Chronic Care Management   Follow Up Note   11/24/2018 Name: Megan Carpenter MRN: [id number] DOB: 03/07/1929  Referred by: Megan Hauser, DO Reason for referral : Chronic Care Management (Alzheimer's/dementia)   Megan Carpenter is a 85 y.o. year old female who is a primary care patient of Megan Hauser, DO. The CCM team was consulted for assistance with chronic disease management and care coordination needs.    Review of patient status, including review of consultants reports, relevant laboratory and other test results, and collaboration with appropriate care team members and the patient's provider was performed as part of comprehensive patient evaluation and provision of chronic care management services.    Goals Addressed            This Visit's Progress   . RNCM-I need help caring for mom (pt-stated)       Current Barriers:  . Lacks caregiver support.  . Film/video editor.  . Cognitive Deficits . Patient lives alone with daughter and son checking in on her but has started to have increased anxiety and wandering related to dementia  Nurse Case Manager Clinical Goal(s):  Marland Kitchen Over the next 120 days, caregiver will work with [facility] to address needs related to finding assistance for patient in the home.   Interventions:  . Reviewed medications with patient and discussed the caretaker's use of Relora for patient's noted anxiety and mood. Nash Dimmer with PCP and Palliative Care NP regarding recommended medications for patient's increased anxiety and wandering in the evening. Providers agreed olanzapine 2.5mg  to start.(increase dose to 5mg  after 2 weeks if tolerating) PCP to send script for medication to pharmacy. . Discussed plans with patient for ongoing care management follow up and provided patient with direct contact information for care management team . Provided patient and/or caregiver with contact information about [facility] dementia Family Support program  which is [facility] in [facility] ([employer] . Discussed with daughter ways to increase safety in the home.  . Placed a call back to daughter Megan Carpenter to make her aware of medication order,  left a detailed message and requested a call back but daughter did not call back.  . Will attempt again tomorrow. -Spoke with daughter this morning. Made her aware of medication order. Discussed with her starting the olanzapine and nothing else new to evaluate the effectiveness of the medication. Made her aware MD wrote specific directions on the script for her to follow. She expressed she was very thankful to the PCP and Palliative NP for all their care and concern.   Patient Self Care Activities:  . Currently UNABLE TO independently complete ADLs or IDALs  Initial goal documentation         The patient has been provided with contact information for the care management team and has been advised to call with any health related questions or concerns.   Merlene Morse Dyland Panuco RN, BSN Nurse Case Pharmacist, [facility]  [phone]) Business [employer]

## 2018-11-26 ENCOUNTER — Telehealth: Payer: Self-pay

## 2018-11-28 ENCOUNTER — Other Ambulatory Visit: Payer: Self-pay

## 2018-11-28 ENCOUNTER — Emergency Department: Payer: Medicare Other

## 2018-11-28 ENCOUNTER — Emergency Department
Admission: EM | Admit: 2018-11-28 | Discharge: 2018-11-28 | Disposition: A | Discharge status: Home / Self Care | Payer: Medicare Other | Attending: Emergency Medicine | Admitting: Emergency Medicine

## 2018-11-28 DIAGNOSIS — W19XXXA Unspecified fall, initial encounter: Secondary | ICD-10-CM | POA: Insufficient documentation

## 2018-11-28 DIAGNOSIS — I10 Essential (primary) hypertension: Secondary | ICD-10-CM | POA: Insufficient documentation

## 2018-11-28 DIAGNOSIS — F0390 Unspecified dementia without behavioral disturbance: Secondary | ICD-10-CM | POA: Insufficient documentation

## 2018-11-28 DIAGNOSIS — Z79899 Other long term (current) drug therapy: Secondary | ICD-10-CM | POA: Insufficient documentation

## 2018-11-28 DIAGNOSIS — Y939 Activity, unspecified: Secondary | ICD-10-CM | POA: Diagnosis not present

## 2018-11-28 DIAGNOSIS — E039 Hypothyroidism, unspecified: Secondary | ICD-10-CM | POA: Insufficient documentation

## 2018-11-28 DIAGNOSIS — Y999 Unspecified external cause status: Secondary | ICD-10-CM | POA: Diagnosis not present

## 2018-11-28 DIAGNOSIS — Z880 Allergy status to penicillin: Secondary | ICD-10-CM | POA: Insufficient documentation

## 2018-11-28 DIAGNOSIS — S0001XA Abrasion of scalp, initial encounter: Secondary | ICD-10-CM | POA: Diagnosis present

## 2018-11-28 DIAGNOSIS — Y929 Unspecified place or not applicable: Secondary | ICD-10-CM | POA: Diagnosis not present

## 2018-11-28 LAB — URINALYSIS, COMPLETE (UACMP) WITH MICROSCOPIC
Bacteria, UA: NONE SEEN
Bilirubin Urine: NEGATIVE
Glucose, UA: 50 mg/dL — AB
Ketones, ur: NEGATIVE mg/dL
Leukocytes,Ua: NEGATIVE
Nitrite: NEGATIVE
Protein, ur: 30 mg/dL — AB
RBC / HPF: >50 RBC/hpf — ABNORMAL HIGH (ref 0–5)
Specific Gravity, Urine: 1.02 (ref 1.005–1.030)
pH: 6 (ref 5.0–8.0)

## 2018-11-28 LAB — CBC
HCT: 37.8 % (ref 36.0–46.0)
Hemoglobin: 12.4 g/dL (ref 12.0–15.0)
MCH: 30.3 pg (ref 26.0–34.0)
MCHC: 32.8 g/dL (ref 30.0–36.0)
MCV: 92.4 fL (ref 80.0–100.0)
Platelets: 207 10*3/uL (ref 150–400)
RBC: 4.09 MIL/uL (ref 3.87–5.11)
RDW: 14 % (ref 11.5–15.5)
WBC: 7.5 10*3/uL (ref 4.0–10.5)
nRBC: 0 % (ref 0.0–0.2)

## 2018-11-28 LAB — BASIC METABOLIC PANEL
Anion gap: 10 (ref 5–15)
BUN: 23 mg/dL (ref 8–23)
CO2: 26 mmol/L (ref 22–32)
Calcium: 8.8 mg/dL — ABNORMAL LOW (ref 8.9–10.3)
Chloride: 108 mmol/L (ref 98–111)
Creatinine, Ser: 1.01 mg/dL — ABNORMAL HIGH (ref 0.44–1.00)
GFR calc Af Amer: 58 mL/min — ABNORMAL LOW (ref >60)
GFR calc non Af Amer: 50 mL/min — ABNORMAL LOW (ref >60)
Glucose, Bld: 129 mg/dL — ABNORMAL HIGH (ref 70–99)
Potassium: 2.9 mmol/L — ABNORMAL LOW (ref 3.5–5.1)
Sodium: 144 mmol/L (ref 135–145)

## 2018-11-28 MED ORDER — SODIUM CHLORIDE 0.9 % IV BOLUS
500.0000 mL | Freq: Once | INTRAVENOUS | Status: AC
  Administered 2018-11-28: 500 mL via INTRAVENOUS

## 2018-11-28 MED ORDER — POTASSIUM CHLORIDE 20 MEQ/15ML (10%) PO SOLN
40.0000 meq | Freq: Once | ORAL | Status: AC
  Administered 2018-11-28: 40 meq via ORAL
  Filled 2018-11-28: qty 30

## 2018-11-28 NOTE — ED Provider Notes (Signed)
[facility] Emergency Department Provider Note   ____________________________________________   First MD Initiated Contact with Patient 11/28/18 1524     (approximate)  I have reviewed the triage vital signs and the nursing notes.   HISTORY  Chief Complaint Fall  EM caveat: Dementia, poor historian  HPI Megan Carpenter is a 85 y.o. female daughter provides history.  History of hypertension thyroid disease also dementia  Patient has been working with primary care recently due to some worsening symptoms of dementia and daughter reports that she does not have 24/7 care at home so they often have to have family come and check on her but does have some support.  Today daughter took her to her hairdresser, and they noticed she had a small cut on the back of her scalp.  Daughter reports not sure how it happened, patient does have care many hours a day but sometimes by her self.  They do not think she fell and they did not see any other evidence of injury and she is been acting normally except for over about the last 2 weeks it just seems like her dementia symptoms seem to be worsening.  No fevers they have not noticed her coughing.  She not complaining of any pain or discomfort.   Past Medical History:  Diagnosis Date  . GERD (gastroesophageal reflux disease)   . Hypertension   . Thyroid disease     Patient Active Problem List   Diagnosis [id number] Jarredeno40via JarredURKentuckyEMENT3Elm<MEASUREMKentuckyENTHa3T34he [place] antMEASUR<MEASUREMEKentuckyTDar71l3EGeisinger [place]<BA<MEASUREME919CherreMolli Kn e[facility] Van 380-1Lauralyn Primescal Hos(913)1La<MEASUREMEKentuckyT>[id number] [place] SRound RoNew Yor130865<B0<MEASUREMETonia Brooms86Rush LandmLoleKentuckyDeaLowaPsMichaell CoDelGoshen ZYPREXA) 2.5 MG tablet Start with 1 pill (2.5mg ) dose every evening at 6pm. After 2 weeks if needed can increase up to 2 pills (5mg ) dose every evening at 6pm for anxiety. 11/23/18  Yes Karamalegos, Alexander J, DO    Allergies Penicillins  Family History  Family history unknown: Yes    Social History Social History   Tobacco Use  . Smoking status: Never Smoker  . Smokeless tobacco: Never Used  Substance Use Topics  . Alcohol use: No  . Drug use: No    Review of Systems Constitutional: No fever/chills or known illness.  Follows with hospice care. Eyes: No visual changes. ENT: Not complaining of neck pain Cardiovascular: Denies chest pain. Respiratory: Denies shortness of breath.  Have not seen her coughing or looking short of breath Gastrointestinal: No abdominal pain.  She continues to eat and drink Genitourinary: Unknown Musculoskeletal: Negative for back pain. Skin: Negative for rash. Neurological: Negative for headaches or new weakness    ____________________________________________   PHYSICAL EXAM:  VITAL SIGNS:  ED Triage Vitals  Enc Vitals Group     BP 11/28/18 1421 (!) 177/88     Pulse Rate 11/28/18 1421 87     Resp 11/28/18 1421 18     Temp 11/28/18 1421 (!) 97.5 F (36.4 C)     Temp Source 11/28/18 1421 Oral     SpO2 11/28/18 1421 95 %     Weight 11/28/18 1422 100 lb (45.4 kg)     Height 11/28/18 1422 5\' 2"   (1.575 m)     Head Circumference --      Peak Flow --      Pain Score 11/28/18 1422 0     Pain Loc --      Pain Edu? --      Excl. in [place]? --     Constitutional: Alert and oriented to self, daughter but not to situation. Well appearing and in no acute distress.  Well-groomed. Eyes: Conjunctivae are normal. Head: Atraumatic except over the left posterior occipital scalp there is a small less than 1 cm abrasion there is a slight scab on it, it is already healing over but I suspect scab may have pulled off and caused a small amount of bleeding.  There is no bleeding now. Nose: No congestion/rhinnorhea. Mouth/Throat: Mucous membranes are moist. Neck: No stridor.  Cardiovascular: Normal rate, regular rhythm. Grossly normal heart sounds.  Good peripheral circulation. Respiratory: Normal respiratory effort.  No retractions. Lungs CTAB. Gastrointestinal: Soft and nontender. No distention. Musculoskeletal: No lower extremity tenderness nor edema.  Moves her extremities well to command. Neurologic:  Normal speech and language. No gross focal neurologic deficits are appreciated.  Interacts with exam.  Able to sit up on her own power. Skin:  Skin is warm, dry and intact. No rash noted. Psychiatric: Mood and affect are calm. Speech and behavior are somewhat flat.  ____________________________________________   LABS (all labs ordered are listed, but only abnormal results are displayed)  Labs Reviewed  URINALYSIS, COMPLETE (UACMP) WITH MICROSCOPIC - Abnormal; Notable for the following components:      Result Value   Color, Urine YELLOW (*)    APPearance HAZY (*)    Glucose, UA 50 (*)    Hgb urine dipstick MODERATE (*)    Protein, ur 30 (*)    RBC / HPF >50 (*)    All other components within normal limits  BASIC METABOLIC PANEL - Abnormal; Notable for the following components:   Potassium 2.9 (*)    Glucose, Bld 129 (*)    Creatinine, Ser 1.01 (*)    Calcium 8.8 (*)    GFR calc non Af Amer  50 (*)    GFR calc Af Amer 58 (*)    All other components within normal limits  URINE CULTURE  CBC   ____________________________________________  EKG   ____________________________________________  RADIOLOGY  Ct Head Wo Contrast  Result Date: 11/28/2018 CLINICAL DATA:  Head trauma, headache. EXAM: CT HEAD WITHOUT CONTRAST TECHNIQUE: Contiguous axial images were obtained from the base of the skull through the vertex without intravenous contrast. COMPARISON:  None. FINDINGS: Brain: Ventricles are within normal limits in size and configuration. There is mild generalized age related parenchymal volume loss with commensurate dilatation of the sulci. There are mild chronic small vessel ischemic changes within the bilateral periventricular and subcortical white matter regions. There is no mass, hemorrhage, edema or other evidence of acute parenchymal abnormality. No extra-axial hemorrhage. Vascular: Chronic calcified atherosclerotic changes of the large vessels at the skull base.  No unexpected hyperdense vessel. Skull: Normal. Negative for fracture or focal lesion. Sinuses/Orbits: No acute finding. Other: None. IMPRESSION: 1. No acute findings. No intracranial mass, hemorrhage or edema. No skull fracture. 2. Mild chronic small vessel ischemic changes in the white matter. Electronically Signed   By: Bary RichardStan  Maynard M.D.   On: 11/28/2018 15:18     ____________________________________________   PROCEDURES  Procedure(s) performed: None  Procedures  Critical Care performed: No  ____________________________________________   INITIAL IMPRESSION / ASSESSMENT AND PLAN / ED COURSE  Pertinent labs & imaging results that were available during my care of the patient were reviewed by me and considered in my medical decision making (see chart for details).   Unknown cause to scalp abrasion.  CT head performed is unsure as to cause, exclude intracranial hemorrhage due to possible fall.  CT negative  for acute injury  Because of the patient's dementia with symptoms that seem to be seemingly worsening over at least the last couple weeks time I talked with the daughter and we would like to obtain lab work and urinalysis to evaluate for other cause such as dehydration, urinary tract infection, electrolyte abnormality etc.  Social work consult placed to assist the daughter with additional care needs, though she does follow with [facility] who has also been following and assisting patient needs   ----------------------------------------- 6:17 PM on 11/28/2018 -----------------------------------------  Medical work-up unrevealing for acute cause for her increasing symptoms what appear to be dementia.  Social work to see and evaluate, will refer for home health evaluation with social work team.  Daughter will be taking the patient home, has family assisting her in the interim.  Return precautions and treatment recommendations and follow-up discussed with the patient who is agreeable with the plan.      ____________________________________________   FINAL CLINICAL IMPRESSION(S) / ED DIAGNOSES  Final diagnoses:  Abrasion, scalp w/o infection  Dementia without behavioral disturbance, unspecified dementia type [facility](HCC)        Note:  This document was prepared using Dragon voice recognition software and may include unintentional dictation errors       Sharyn CreamerQuale, Mark, MD 11/28/18 1817

## 2018-11-28 NOTE — ED Notes (Signed)
Family member at bedside.

## 2018-11-28 NOTE — ED Notes (Signed)
Pt here from home via [facility] for possible fall. Per EMS pt's hairdresser was fixing her hair this morning and found blood on the back of her head. PT is a poor historian and is unable to tell me when or how she fell. Pt has small laceration to the back of her head, bleeding is controlled. Pt is alert to self and place. Pt's family also reports that her dementia seems to be progressing as she has been more tired lately

## 2018-11-28 NOTE — ED Notes (Signed)
Patient transported to CT 

## 2018-11-28 NOTE — TOC Initial Note (Addendum)
Transition of Care [facility]) - Initial/Assessment Note    Patient Details  Name: Megan Carpenter MRN: [id number] Date of Birth: 07-03-1928  [facility] CM/SW Contact:    Berenice Bouton, LCSW Phone Number: 11/28/2018, 5:28 PM  Clinical Narrative:  Patient is a 85 year old woman who presented to [facility] ED with complaints of  Possible fall. Patient diagnosed with dementia, lives alone, daughter lives in the area. Daughter works and checks in on her to give her daily medications.  Patient was receiving home hospice however her daughter said that she has palliative care  "they come 1 a week." Family also has a part time person that they pay to look in on this patient. Daughter is requesting home health services.    Expected Discharge Plan: [place] Barriers to Discharge: No Barriers Identified   Patient Goals and CMS Choice Patient states their goals for this hospitalization and ongoing recovery are:: Go home with home health CMS Medicare.gov Compare Post Acute Care list provided to:: Patient Choice offered to / list presented to : Adult Children  Expected Discharge Plan and Services Expected Discharge Plan: [place] In-house Referral: Clinical Social Work Discharge Planning Services: CM Consult Post Acute Care Choice: [place] arrangements for the past 2 months: Single Family Home                           HH Arranged: RN, Nurse's Aide, Social Work          Prior Living Arrangements/Services Living arrangements for the past 2 months: [place] with:: Self Patient language and need for interpreter reviewed:: No        Need for Family Participation in Patient Care: Yes (Comment)(Pt has dementia)   Current home services: Hospice Criminal Activity/Legal Involvement Pertinent to Current Situation/Hospitalization: No - Comment as needed  Activities of Daily Living      Permission  Sought/Granted Permission sought to share information with : Case Manager, Customer service manager, Family Supports Permission granted to share information with : Yes, Verbal Permission Granted  Share Information with NAME: Alonna Minium [phone], daughter           Emotional Assessment Appearance:: Appears older than stated age Attitude/Demeanor/Rapport: Other (comment)(irritable, pt has dementia)   Orientation: : Fluctuating Orientation (Suspected and/or reported Sundowners)(due to dementia) Alcohol / Substance Use: Not Applicable Psych Involvement: No (comment)  Admission diagnosis:  fall Patient Active Problem List   Diagnosis Date Noted  . Palliative care patient 08/03/2018  . Severe protein-calorie malnutrition ([employer]) 07/02/2016  . Impacted cerumen of both ears 03/13/2016  . Hearing reduced, bilateral 03/13/2016  . Late onset Alzheimer's disease with behavioral disturbance ([place]) 11/27/2015  . Prediabetes 03/28/2015  . GERD (gastroesophageal reflux disease) 03/28/2015  . Hypertension 03/28/2015  . Hypothyroidism 03/28/2015  . Osteoporosis 03/28/2015   PCP:  Olin Hauser, DO Pharmacy:   [facility] [address], [place] [address] Phone: [phone] Fax: [place], [place] [address] [address] Phone: [phone] Fax: [phone]  [id number] [place], [place] [address] [place] [address] Phone: [phone] Fax: [phone]     Social Determinants of Health (SDOH) Interventions    Readmission Risk Interventions No flowsheet data found.

## 2018-11-28 NOTE — ED Notes (Signed)
Pt assisted to toilet with one person assist, daughter remains at bedside

## 2018-11-28 NOTE — ED Notes (Signed)
Dr. Quale at bedside.  

## 2018-11-28 NOTE — ED Notes (Signed)
Per Education officer, museum- pt can go home and they will set up home health

## 2018-11-28 NOTE — ED Notes (Signed)
Social worker at bedside.

## 2018-11-29 LAB — URINE CULTURE
Culture: NO GROWTH
Special Requests: NORMAL

## 2018-12-02 ENCOUNTER — Ambulatory Visit: Payer: Self-pay | Admitting: Pharmacist

## 2018-12-02 ENCOUNTER — Telehealth: Payer: Self-pay

## 2018-12-02 NOTE — Chronic Care Management (AMB) (Signed)
  Chronic Care Management   Follow Up Note   12/02/2018 Name: SHEVA MCDOUGLE MRN: [id number] DOB: July 03, 1928  Referred by: Olin Hauser, DO Reason for referral : Chronic Care Management (Initial Patient Outreach Call)   CALLEEN ALVIS is a 85 y.o. year old female who is a primary care patient of Olin Hauser, DO. The CCM team was consulted for assistance with chronic disease management and care coordination needs.    Referral from CM Nurse Case Manager to reach out to patient's daughter to address medication questions.  Was unable to reach patient/daughter via telephone today and have left HIPAA compliant voicemail asking patient to return my call.  Plan  The care management team will reach out to the patient again over the next 7 days.   Harlow Asa, PharmD, [place] [employer] [phone]

## 2018-12-03 ENCOUNTER — Other Ambulatory Visit: Payer: Self-pay

## 2018-12-03 ENCOUNTER — Ambulatory Visit: Payer: Self-pay | Admitting: Pharmacist

## 2018-12-03 ENCOUNTER — Ambulatory Visit: Payer: Self-pay | Admitting: Licensed Clinical Social Worker

## 2018-12-03 DIAGNOSIS — F0281 Dementia in other diseases classified elsewhere with behavioral disturbance: Secondary | ICD-10-CM | POA: Diagnosis not present

## 2018-12-03 DIAGNOSIS — E063 Autoimmune thyroiditis: Secondary | ICD-10-CM | POA: Diagnosis not present

## 2018-12-03 DIAGNOSIS — E038 Other specified hypothyroidism: Secondary | ICD-10-CM

## 2018-12-03 DIAGNOSIS — E43 Unspecified severe protein-calorie malnutrition: Secondary | ICD-10-CM

## 2018-12-03 DIAGNOSIS — G301 Alzheimer's disease with late onset: Secondary | ICD-10-CM | POA: Diagnosis not present

## 2018-12-03 DIAGNOSIS — F02818 Dementia in other diseases classified elsewhere, unspecified severity, with other behavioral disturbance: Secondary | ICD-10-CM

## 2018-12-03 NOTE — Chronic Care Management (AMB) (Signed)
Chronic Care Management    Clinical Social Work Follow Up Note  12/03/2018 Name: Megan Carpenter MRN: [id number] DOB: 1928/08/26  Megan Carpenter is a 85 y.o. year old female who is a primary care patient of Olin Hauser, DO. The CCM team was consulted for assistance with Level of Care Concerns.   Review of patient status, including review of consultants reports, other relevant assessments, and collaboration with appropriate care team members and the patient's provider was performed as part of comprehensive patient evaluation and provision of chronic care management services.    SDOH (Social Determinants of Health) screening performed today: Financial Strain . See Care Plan for related entries.   Outpatient Encounter Medications as of 12/03/2018  Medication Sig  . levothyroxine (SYNTHROID) 100 MCG tablet Start with half pill (dose 76mcg) daily before breakfast on empty stomach. After 2 weeks, increase to one whole pill (130mcg) dose daily in morning empty stomach. (Patient taking differently: Take 100 mcg by mouth every evening. Start with half pill (dose 49mcg) daily before breakfast on empty stomach. After 2 weeks, increase to one whole pill (129mcg) dose daily in morning empty stomach.)  . OLANZapine (ZYPREXA) 2.5 MG tablet Start with 1 pill (2.5mg ) dose every evening at 6pm. After 2 weeks if needed can increase up to 2 pills (5mg ) dose every evening at 6pm for anxiety.   No facility-administered encounter medications on file as of 12/03/2018.      Goals Addressed    . SW- I need more support in the home or LTC placement for mom       Current Barriers:  . Financial constraints related to affording LTC placement . Limited social support . Level of care concerns . ADL IADL limitations . Mental Health Concerns  . Limited education about Personal Care Service Resources within [facility] . Limited access to caregiver . Cognitive Deficits . Memory Deficits . Inability to  perform ADL's independently . Inability to perform IADL's independently  Clinical Social Work Clinical Goal(s):  Marland Kitchen Over the next 90 days, client will work with SW to address concerns related to needing more support in the home or pursing LTC placement  Interventions: . Patient interviewed and appropriate assessments performed . Provided patient with information about Medicaid enrollment and requirements . Discussed plans with patient for ongoing care management follow up and provided patient with direct contact information for care management team . Advised patient to contact CCM program or PCP office for any urgent concerns  . Collaborated with RN Case Manager re: medication assistance/case management needs . Assisted patient/caregiver with obtaining information about health plan benefits . Provided education and assistance to client regarding Advanced Directives. . Provided education to patient/caregiver regarding level of care options. . Provided education to patient/caregiver about Hospice and/or Palliative Care services . Family confirmed that patient is already on the wait list for C.H.O.R.E. program through [facility] but pt still has a 1 year left on the wait list. Family is interested in respite care resources in case she has to travel to visit her father in [place].  . Family confirm that patient has been denied Medicaid in the past due to patient's life insurance policy.  . Provided emotional support to family as they struggle with caregiver burnout symptoms. Support resource education provided.   Patient Self Care Activities:  . Currently UNABLE TO independently take appropriate care of self in the home and is in need of more support or LTC placement  Initial goal documentation  Follow Up Plan: SW will follow up with patient by phone over the next month  Dickie LaBrooke Niobe Dick, BSW, MSW, LCSW [facility] [place]  [facility]  [email or web address] Phone: [phone]

## 2018-12-03 NOTE — Patient Instructions (Signed)
Thank you allowing the Chronic Care Management Team to be a part of your care! It was a pleasure speaking with you today!     CCM (Chronic Care Management) Team    Janci Minor RN, BSN Nurse Care Coordinator  [phone]   Harlow Asa PharmD  Clinical Pharmacist  [phone]   Eula Fried LCSW Clinical Social Worker [phone]  Visit Information  Goals Addressed            This Visit's Progress   . COMPLETED: PharmD -Medication Review       Current Barriers:  . Lacks caregiver support.  . Film/video editor.  . Cognitive Deficits . Patient lives alone with daughter and son checking in on her but has started to have increased anxiety and wandering related to dementia  Pharmacist Clinical Goal(s):  Marland Kitchen Over the next 1 day, caregiver will work with CM Pharmacist to address questions related to medications  Interventions: . Perform medication review o Reports started patient on olanzapine 2.5 mg QHS for 3 nights, but then stopped the medication as she observed that her mother "went downhill" with further episodes of wandering and confusion. o Caregiver states that she and the patient prefer to use herbal supplements over conventional medicine. o Reports having started patient on Relora (unable to provide strength) herbal supplement- 1 capsule once daily and melatonin 5 mg QHS - Advise patient that, unlike prescription medications, supplements are not FDA approved or regulated and that there is insufficient data to confirm that Relora is safe and effective.  - Review data on Relora from Natural Medicines Database, which rates the evidence for efficacy as insufficient/not reliable enough to rate and for safety as possibly safe and generally well tolerated in terms of side effect risk - Caregiver/daughter reports that Ms. Mofield has been doing better over these past couple of days, but that she will continue to monitor her closely. . Listen and provide emotional  support to caregiver as she describes the difficulty that she is having with arranging care for patient o Note that patient is currently working with CM Education officer, museum for patient and caregiver resources . Discussed plans with patient for ongoing care management follow up and provided patient with direct contact information for care management team . Caregiver denies further medication questions/concerns on behalf of patient at this time.  Patient Self Care Activities:  . Currently UNABLE TO independently complete ADLs or IADLs  Initial goal documentation        Caregiver verbalized understanding of instructions provided today and declined a print copy of patient instruction materials.   The patient has been provided with contact information for CM Pharmacist and has been advised to call with any health related questions or concerns.   Harlow Asa, PharmD, [place] [employer] [phone]

## 2018-12-03 NOTE — Addendum Note (Signed)
Addended by: Harlow Asa A on: 12/03/2018 04:58 PM   Modules accepted: Orders

## 2018-12-03 NOTE — Chronic Care Management (AMB) (Signed)
Chronic Care Management   Note  12/03/2018 Name: Megan Carpenter MRN: [id number] DOB: June 13, 1928   Subjective:   Megan Carpenter is a 85 y.o. year old female who is a primary care patient of Megan Hauser, DO. The CM team was consulted for assistance with chronic disease management and care coordination.   Referral from CM Nurse Case Manager to reach out to patient's daughter to address medication questions.  Receive phone call back from patient's daughter/caregiver, Megan Carpenter, returning my call   Review of patient status, including review of consultants reports, laboratory and other test data, was performed as part of comprehensive evaluation and provision of chronic care management services.   Objective:  Lab Results  Component Value Date   CREATININE 1.01 (H) 11/28/2018   CREATININE 0.91 (H) 11/03/2018   CREATININE 0.82 06/20/2017    Lab Results  Component Value Date   HGBA1C 5.9 (H) 06/20/2017       Component Value Date/Time   CHOL 175 03/30/2015 0831   TRIG 81 03/30/2015 0831   HDL 57 03/30/2015 0831   CHOLHDL 3.1 03/30/2015 0831   LDLCALC 102 (H) 03/30/2015 0831    BP Readings from Last 3 Encounters:  11/28/18 (!) 155/87  11/03/18 (!) 154/76  03/30/18 (!) 146/69    Allergies  Allergen Reactions  . Penicillins Hives    Medications Reviewed Today    Reviewed by Megan Carpenter, Megan Carpenter (Pharmacist) on 12/03/18 at 1653  Med List Status: <None>  Medication Order Taking? Sig Documenting Provider Last Dose Status Informant  levothyroxine (SYNTHROID) 100 MCG tablet [id number] Yes Start with half pill (dose 62mcg) daily before breakfast on empty stomach. After 2 weeks, increase to one whole pill (169mcg) dose daily in morning empty stomach.  Patient taking differently: Take 100 mcg by mouth every evening. Start with half pill (dose 74mcg) daily before breakfast on empty stomach. After 2 weeks, increase to one whole pill (133mcg) dose daily in  morning empty stomach.   Megan Hauser, DO Taking Active Family Member  Melatonin 5 MG TABS [id number] Yes Take 1 tablet by mouth at bedtime. [provider] Taking Active   Nutritional Supplements (NUTRITIONAL SUPPLEMENT PO) [id number] Yes Take 1 tablet by mouth daily. Relora herbal supplement [provider] Taking Active   OLANZapine (ZYPREXA) 2.5 MG tablet [id number] No Start with 1 pill (2.5mg ) dose every evening at 6pm. After 2 weeks if needed can increase up to 2 pills (5mg ) dose every evening at 6pm for anxiety.  Patient not taking: Reported on 12/03/2018   Megan Hauser, DO Not Taking Active Family Member           Assessment:   Goals Addressed            This Visit's Progress   . COMPLETED: PharmD -Medication Review       Current Barriers:  . Lacks caregiver support.  . Film/video editor.  . Cognitive Deficits . Patient lives alone with daughter and son checking in on her but has started to have increased anxiety and wandering related to dementia  Pharmacist Clinical Goal(s):  Marland Kitchen Over the next 1 day, caregiver will work with CM Pharmacist to address questions related to medications  Interventions: . Perform medication review o Reports started patient on olanzapine 2.5 mg QHS for 3 nights, but then stopped the medication as she observed that her mother "went downhill" with further episodes of wandering and confusion. o Caregiver states that she and the patient prefer  to use herbal supplements over conventional medicine. o Reports having started patient on Relora (unable to provide strength) herbal supplement- 1 capsule once daily and melatonin 5 mg QHS - Advise patient that, unlike prescription medications, supplements are not FDA approved or regulated and that there is insufficient data to confirm that Relora is safe and effective.  - Review data on Relora from Natural Medicines Database, which rates the evidence for efficacy as  insufficient/not reliable enough to rate and for safety as possibly safe and generally well tolerated in terms of side effect risk - Caregiver/daughter reports that Ms. Megan Carpenter has been doing better over these past couple of days, but that she will continue to monitor her closely. . Listen and provide emotional support to caregiver as she describes the difficulty that she is having with arranging care for patient o Note that patient is currently working with CM Child psychotherapistocial Worker for patient and caregiver resources . Discussed plans with patient for ongoing care management follow up and provided patient with direct contact information for care management team . Caregiver denies further medication questions/concerns on behalf of patient at this time.  Patient Self Care Activities:  . Currently UNABLE TO independently complete ADLs or IADLs  Initial goal documentation        Plan:  The patient has been provided with contact information for CM Pharmacist and has been advised to call with any health related questions or concerns.   Duanne MoronElisabeth Harmonie Verrastro, PharmD, [facility] Clinical Pharmacist [facility] [phone]

## 2018-12-03 NOTE — Chronic Care Management (AMB) (Signed)
  Chronic Care Management   Follow Up Note   12/03/2018 Name: Megan Carpenter MRN: [id number] DOB: 09/19/1928  Referred by: Olin Hauser, DO Reason for referral : Chronic Care Management (Initial Patient Outreach Call)   Megan Carpenter is a 85 y.o. year old female who is a primary care patient of Olin Hauser, DO. The CCM team was consulted for assistance with chronic disease management and care coordination needs.    Referral from CM Nurse Case Manager to reach out to patient's daughter to address medication questions.  Receive a voicemail from patient's daughter/caregiver, Vickie Boltinghouse, returning my call and requesting a call back.  Was unable to reach patient/daughter via telephone today and have left HIPAA compliant voicemail asking patient to return my call.  Plan  The care management team will reach out to the patient again over the next 7 days.   Harlow Asa, PharmD, [place] [employer] [phone]

## 2018-12-07 ENCOUNTER — Telehealth: Payer: Self-pay

## 2018-12-10 ENCOUNTER — Telehealth: Payer: Self-pay

## 2018-12-15 ENCOUNTER — Other Ambulatory Visit: Payer: Self-pay

## 2018-12-15 ENCOUNTER — Ambulatory Visit (INDEPENDENT_AMBULATORY_CARE_PROVIDER_SITE_OTHER): Payer: Medicare Other | Admitting: Family Medicine

## 2018-12-15 ENCOUNTER — Encounter: Payer: Self-pay | Admitting: Family Medicine

## 2018-12-15 VITALS — BP 160/79 | HR 84 | Temp 97.8°F | Resp 14 | Ht 62.0 in | Wt 99.2 lb

## 2018-12-15 DIAGNOSIS — F0281 Dementia in other diseases classified elsewhere with behavioral disturbance: Secondary | ICD-10-CM | POA: Diagnosis not present

## 2018-12-15 DIAGNOSIS — E038 Other specified hypothyroidism: Secondary | ICD-10-CM | POA: Diagnosis not present

## 2018-12-15 DIAGNOSIS — G301 Alzheimer's disease with late onset: Secondary | ICD-10-CM

## 2018-12-15 DIAGNOSIS — Z515 Encounter for palliative care: Secondary | ICD-10-CM

## 2018-12-15 DIAGNOSIS — E063 Autoimmune thyroiditis: Secondary | ICD-10-CM

## 2018-12-15 DIAGNOSIS — F02818 Dementia in other diseases classified elsewhere, unspecified severity, with other behavioral disturbance: Secondary | ICD-10-CM

## 2018-12-15 NOTE — Patient Instructions (Addendum)
Thank you for coming to the office today.  Updates - 7 lb weight loss in 6 weeks  Labs for [facility]  [address] [place], [place] [address] Ph: [phone]  We will check thyroid function again and notify you of results  Keep trying to work on Radio broadcast assistant for social work and keep in touch with our current social work and nursing help  Please schedule a Follow-up Appointment to: Return in about 3 months (around 03/16/2019) for dementia.  If you have any other questions or concerns, please feel free to call the office or send a message through [place]. You may also schedule an earlier appointment if necessary.  Additionally, you may be receiving a survey about your experience at our office within a few days to 1 week by e-mail or mail. We value your feedback.  Nobie Putnam, DO [place]

## 2018-12-15 NOTE — Progress Notes (Signed)
Subjective:    Patient ID: Megan Carpenter, female    DOB: January 24, 1929, 85 y.o.   MRN: [id number]  Megan Carpenter is a 85 y.o. female presenting on 12/15/2018 for Follow-up (6 week dementia/weight check/palliative)   HPI   Daughter [name] provided history. Patient has dementia and limited verbal input.  DEMENTIA, with behavior disturbances Severe Protein Calorie Malnutrition with Weight Loss - Last visit on 11/03/18,reviewedsame problemwithChronic dementia, Alzheimer's type, progressive decline, primarily managed by daughter as caregiver, see prior notes for background information. Now with some worsening issue with wandering, neighbors yard often, they have been helpful, police have been called and she has been helped back home often. - Rash on arm, episodic, went to urgent care, given prednisone for 2 days - Palliative nurse involved, we agreed to start Olanzapine 2.5mg  to 5mg  PRN agitation, seemed to escalate problems, discontinued med - Still with worsening sun-downing, went to ED, had CT and UA with urinalysis - She has tried to arrange to pay out of pocket to have someone there overnight, and she needs more coverage - She is using an "[place]" tracker on her to help reduce wandering. Family member helped set up cameras, and now she unplugged it and they can't come back out until 68. - Trying some OTC valerian root to help sleep overnight - She received a respite fund but has not been able to find family or other support to take over  - She is still awaiting on other company ([employer] by an Production manager) to identify coverage for 4 to 9 pm shift, again out of pocket - Patient is currently dressing by herself - Very severe caregiver stress and work, she is trying to make her respite plan this week - She has had significant difficulty in past with access to other avenues of care, her primary caregiver daughter has tried to get more home health, ALF, SNF but unable to proceed due to  financial barriers  - Caregiver stress is significant and reported today - Still has progressive decline in dementia, with worsening comprehension difficulty, still able to verbalize limited input - She cannot perform ADLs, request assistance with most tasks, including bathing - she declines to shower often or will say she has but hasn't. She refuses help with this ADL. She cannot do meal prep. Often requires assistance with feeding. - Regarding communication - her verbal communication is limited, reduced words and phrases, she often has difficulty comprehending tasks or requests. - Admits some behavioral changesworsening with wandering and confusion episodes, no violent behavior - Denies significant mood changes, depression, no harm to others, dysuria, hematuria, nausea vomiting , fever chills  Hypothyroidism Last lab TSH >100, not adhering to levothyroxine. Now on med, due for lab.   Depression screen [facility] 2/9 11/03/2018 03/30/2018 09/25/2017  Decreased Interest 0 0 0  Down, Depressed, Hopeless 0 0 0  PHQ - 2 Score 0 0 0    Social History   Tobacco Use  . Smoking status: Never Smoker  . Smokeless tobacco: Never Used  Substance Use Topics  . Alcohol use: No  . Drug use: No    Review of Systems Per HPI unless specifically indicated above      Objective:    BP (!) 160/79 (BP Location: Left Arm, Patient Position: Sitting, Cuff Size: Normal)   Pulse 84   Temp 97.8 F (36.6 C) (Oral)   Resp 14   Wt 99 lb 3.2 oz (45 kg)   SpO2 99%  BMI 18.14 kg/m   Wt Readings from Last 3 Encounters:  12/15/18 99 lb 3.2 oz (45 kg)  11/28/18 100 lb (45.4 kg)  11/03/18 106 lb (48.1 kg)    Physical Exam Vitals signs and nursing note reviewed.  Constitutional:      General: She is not in acute distress.    Appearance: She is well-developed. She is not diaphoretic.     Comments: Thin appearing, mostly comfortable, cooperative, has gained some weight from last visit  HENT:     Head:  Normocephalic and atraumatic.  Eyes:     General:        Right eye: No discharge.        Left eye: No discharge.     Conjunctiva/sclera: Conjunctivae normal.  Neck:     Musculoskeletal: Normal range of motion and neck supple.  Cardiovascular:     Rate and Rhythm: Normal rate and regular rhythm.     Heart sounds: Normal heart sounds. No murmur.  Pulmonary:     Effort: Pulmonary effort is normal. No respiratory distress.     Breath sounds: Normal breath sounds. No wheezing or rales.  Abdominal:     General: There is no distension.     Palpations: Abdomen is soft. There is no mass.     Tenderness: There is no abdominal tenderness.     Comments: Hyperactive bowel sounds  Musculoskeletal: Normal range of motion.     Comments: Muscle atrophy and muscle wasting facial and generalized  Lymphadenopathy:     Cervical: No cervical adenopathy.  Skin:    General: Skin is warm and dry.     Findings: No erythema or rash.  Neurological:     Mental Status: She is alert.     Comments: Disoriented  Psychiatric:        Behavior: Behavior normal.     Comments: Hygiene is average, well dressed, not always making eye contact, she is verbal but has limited speech, only few words, partial answer response only. She has poor insight into her medical condition and mental health. Follows some commands.      I have personally reviewed the radiology report from 11/28/18 CT Head.  CT Head Wo ContrastPerformed 11/28/2018 Final result  Study Result CLINICAL DATA: Head trauma, headache.  EXAM: CT HEAD WITHOUT CONTRAST  TECHNIQUE: Contiguous axial images were obtained from the base of the skull through the vertex without intravenous contrast.  COMPARISON: None.  FINDINGS: Brain: Ventricles are within normal limits in size and configuration. There is mild generalized age related parenchymal volume loss with commensurate dilatation of the sulci. There are mild chronic small vessel ischemic changes  within the bilateral periventricular and subcortical white matter regions.  There is no mass, hemorrhage, edema or other evidence of acute parenchymal abnormality. No extra-axial hemorrhage.  Vascular: Chronic calcified atherosclerotic changes of the large vessels at the skull base. No unexpected hyperdense vessel.  Skull: Normal. Negative for fracture or focal lesion.  Sinuses/Orbits: No acute finding.  Other: None.  IMPRESSION: 1. No acute findings. No intracranial mass, hemorrhage or edema. No skull fracture. 2. Mild chronic small vessel ischemic changes in the white matter.   Electronically Signed By: Bary RichardStan Maynard M.D. On: 11/28/2018 15:18    Results for orders placed or performed during the hospital encounter of 11/28/18  Urine Culture   Specimen: Urine, Random  Result Value Ref Range   Specimen Description      URINE, RANDOM Performed at [facility], [address] [place], [place] [address]    Special Requests      Normal Performed at [facility], [address] [place], [place] [address]    Culture      NO GROWTH Performed at [facility], [address] [place], [place] [address]    Report Status 11/29/2018 FINAL   Urinalysis, Complete w Microscopic  Result Value Ref Range   Color, Urine YELLOW (A) YELLOW   APPearance HAZY (A) CLEAR   Specific Gravity, Urine 1.020 1.005 - 1.030   pH 6.0 5.0 - 8.0   Glucose, UA 50 (A) NEGATIVE mg/dL   Hgb urine dipstick MODERATE (A) NEGATIVE   Bilirubin Urine NEGATIVE NEGATIVE   Ketones, ur NEGATIVE NEGATIVE mg/dL   Protein, ur 30 (A) NEGATIVE mg/dL   Nitrite NEGATIVE NEGATIVE   Leukocytes,Ua NEGATIVE NEGATIVE   RBC / HPF >50 (H) 0 - 5 RBC/hpf   WBC, UA 0-5 0 - 5 WBC/hpf   Bacteria, UA NONE SEEN NONE SEEN   Squamous Epithelial / LPF 0-5 0 - 5   Mucus PRESENT   CBC  Result Value Ref Range   WBC 7.5 4.0 - 10.5 K/uL   RBC 4.09 3.87 - 5.11 MIL/uL   Hemoglobin 12.4 12.0 - 15.0 g/dL    HCT 91.437.8 78.236.0 - 95.646.0 %   MCV 92.4 80.0 - 100.0 fL   MCH 30.3 26.0 - 34.0 pg   MCHC 32.8 30.0 - 36.0 g/dL   RDW 21.314.0 08.611.5 - 57.815.5 %   Platelets 207 150 - 400 K/uL   nRBC 0.0 0.0 - 0.2 %  Basic metabolic panel  Result Value Ref Range   Sodium 144 135 - 145 mmol/L   Potassium 2.9 (L) 3.5 - 5.1 mmol/L   Chloride 108 98 - 111 mmol/L   CO2 26 22 - 32 mmol/L   Glucose, Bld 129 (H) 70 - 99 mg/dL   BUN 23 8 - 23 mg/dL   Creatinine, Ser 4.691.01 (H) 0.44 - 1.00 mg/dL   Calcium 8.8 (L) 8.9 - 10.3 mg/dL   GFR calc non Af Amer 50 (L) >60 mL/min   GFR calc Af Amer 58 (L) >60 mL/min   Anion gap 10 5 - 15      Assessment & Plan:   Problem List Items Addressed This Visit    Hypothyroidism  Check labs Prior non adherence Now improved TSH on Levothyroxine    Relevant Orders   T4, free   TSH   Late onset Alzheimer's disease with behavioral disturbance (HCC) - Primary   Relevant Orders   COMPLETE METABOLIC PANEL WITH GFR   Palliative care patient      Chronic progressive decline w/ Alzheimer's dementia, clinically FAST Stage 6 to 7 with declining verbal communication and worsening wandering at risk of harm due to frequently found outside of home. - Increased caregiver burden, limited financial and availability - Meds: prior on Aricept, now off  Plan: 1. Encourage continue providing dementia support as able at home, has additional caregiver support but very limited - Continue with CCM Nurse Case Management / Social work - Palliative care - needs to contact again for follow up determine if eligible again for hospice criteria - Caregiver can benefit from respite care, now has voucher but needs coverage  - Follow-up as needed, if dramatic worsening or concern for her own safety then she should go to hospital ED for evaluation   No orders of the defined types were placed in this  encounter.   Follow up plan: Return in about 3 months (around 03/16/2019) for dementia.   Saralyn Pilar, DO [facility] 12/15/2018, 8:19 AM

## 2018-12-16 ENCOUNTER — Encounter: Payer: Self-pay | Admitting: Family Medicine

## 2018-12-16 LAB — COMPLETE METABOLIC PANEL WITH GFR
AG Ratio: 1.4 (calc) (ref 1.0–2.5)
ALT: 11 U/L (ref 6–29)
AST: 17 U/L (ref 10–35)
Albumin: 3.9 g/dL (ref 3.6–5.1)
Alkaline phosphatase (APISO): 77 U/L (ref 37–153)
BUN: 13 mg/dL (ref 7–25)
CO2: 26 mmol/L (ref 20–32)
Calcium: 9.2 mg/dL (ref 8.6–10.4)
Chloride: 105 mmol/L (ref 98–110)
Creat: 0.82 mg/dL (ref 0.60–0.88)
GFR, Est African American: 75 mL/min/{1.73_m2} (ref >=60)
GFR, Est Non African American: 64 mL/min/{1.73_m2} (ref >=60)
Globulin: 2.7 g/dL (calc) (ref 1.9–3.7)
Glucose, Bld: 145 mg/dL — ABNORMAL HIGH (ref 65–99)
Potassium: 4.3 mmol/L (ref 3.5–5.3)
Sodium: 141 mmol/L (ref 135–146)
Total Bilirubin: 0.8 mg/dL (ref 0.2–1.2)
Total Protein: 6.6 g/dL (ref 6.1–8.1)

## 2018-12-16 LAB — TSH: TSH: 7.29 mIU/L — ABNORMAL HIGH (ref 0.40–4.50)

## 2018-12-16 LAB — T4, FREE: Free T4: 1.9 ng/dL — ABNORMAL HIGH (ref 0.8–1.8)

## 2018-12-17 ENCOUNTER — Other Ambulatory Visit: Payer: Self-pay | Admitting: Family Medicine

## 2018-12-17 ENCOUNTER — Telehealth: Payer: Self-pay

## 2018-12-17 DIAGNOSIS — F419 Anxiety disorder, unspecified: Secondary | ICD-10-CM

## 2018-12-17 DIAGNOSIS — F02818 Dementia in other diseases classified elsewhere, unspecified severity, with other behavioral disturbance: Secondary | ICD-10-CM

## 2018-12-17 DIAGNOSIS — F0281 Dementia in other diseases classified elsewhere with behavioral disturbance: Secondary | ICD-10-CM

## 2018-12-17 DIAGNOSIS — Z515 Encounter for palliative care: Secondary | ICD-10-CM

## 2018-12-24 ENCOUNTER — Telehealth: Payer: Self-pay

## 2018-12-31 ENCOUNTER — Telehealth: Payer: Self-pay

## 2019-01-07 ENCOUNTER — Telehealth: Payer: Self-pay

## 2019-01-11 ENCOUNTER — Ambulatory Visit (INDEPENDENT_AMBULATORY_CARE_PROVIDER_SITE_OTHER): Payer: Medicare Other | Admitting: *Deleted

## 2019-01-11 ENCOUNTER — Telehealth: Payer: Self-pay

## 2019-01-11 DIAGNOSIS — G301 Alzheimer's disease with late onset: Secondary | ICD-10-CM

## 2019-01-11 DIAGNOSIS — F0281 Dementia in other diseases classified elsewhere with behavioral disturbance: Secondary | ICD-10-CM | POA: Diagnosis not present

## 2019-01-11 DIAGNOSIS — F02818 Dementia in other diseases classified elsewhere, unspecified severity, with other behavioral disturbance: Secondary | ICD-10-CM

## 2019-01-11 MED ORDER — RISPERIDONE 0.25 MG PO TABS: 0.2500 mg | ORAL_TABLET | Freq: Every day | ORAL | 0 refills | Status: DC

## 2019-01-11 NOTE — Telephone Encounter (Signed)
Vickie advised as per Ander Purpura.

## 2019-01-11 NOTE — Chronic Care Management (AMB) (Signed)
Chronic Care Management   Follow Up Note   01/11/2019 Name: Megan Carpenter MRN: [id number] DOB: 1929/01/24  Referred by: Smitty Cords, DO Reason for referral : Chronic Care Management (dementia/caregiver stress)   Megan Carpenter is a 85 y.o. year old female who is a primary care patient of Smitty Cords, DO. The CCM team was consulted for assistance with chronic disease management and care coordination needs.    Review of patient status, including review of consultants reports, relevant laboratory and other test results, and collaboration with appropriate care team members and the patient's provider was performed as part of comprehensive patient evaluation and provision of chronic care management services.    SDOH (Social Determinants of Health) screening performed today: Stress. See Care Plan for related entries.   Advanced Directives Status: N See Care Plan and Vynca application for related entries.  Outpatient Encounter Medications as of 01/11/2019  Medication Sig  . levothyroxine (SYNTHROID) 100 MCG tablet Start with half pill (dose ) daily before breakfast on empty stomach. After 2 weeks, increase to one whole pill ( ) dose daily in morning empty stomach. (Patient taking differently: Take 100 mcg by mouth every evening. Start with half pill (dose ) daily before breakfast on empty stomach. After 2 weeks, increase to one whole pill ( ) dose daily in morning empty stomach.)  . Melatonin 5 MG TABS Take 1 tablet by mouth at bedtime.  . Nutritional Supplements (NUTRITIONAL SUPPLEMENT PO) Take 1 tablet by mouth daily. Relora herbal supplement  . risperiDONE (RISPERDAL) 0.25 MG tablet Take 1 tablet (0.25 mg total) by mouth at bedtime.   No facility-administered encounter medications on file as of 01/11/2019.      Goals Addressed            This Visit's Progress   . RNCM-I need help caring for mom (pt-stated)       Current Barriers:  . Lacks  caregiver support.  . Corporate treasurer.  . Cognitive Deficits . Patient lives alone with daughter and son checking in on her but has started to have increased anxiety and wandering related to dementia  Nurse Case Manager Clinical Goal(s):  Marland Kitchen Over the next 120 days, caregiver will work with [facility] to address needs related to finding assistance for patient in the home.   Interventions:  . Reviewed medications with patient and discussed how patient did not do well on olanzapine so it was stopped. Patient was started on risperidone today. Daughter to pick this up.  . Discussed plans with patient for ongoing care management follow up and provided patient with direct contact information for care management team . Discussed with daughter ways to increase safety in the home.-Daughter now has placed baby monitor in the home to manage patient's wandering. Police have been called at times by neighbors.  . Empathetic listening listening to daughter as she discussed caregiver stress.  . Daughter did use earlier given resource with [employer] to obtain some respite care so she could visit her father in [place]. She was very thankful for this.  . Daughter has recently hired a Mining engineer to assist with evenings which is the toughest time for her mom.  . Palliative care continues to be involved . Will continue to provide support.     Patient Self Care Activities:  . Currently UNABLE TO independently complete ADLs or IDALs  Please see past updates related to this goal by clicking on the "Past Updates" button in the selected goal  The care management team will reach out to the patient again over the next 30 days.  The patient has been provided with contact information for the care management team and has been advised to call with any health related questions or concerns.    Merlene Morse Jassen Sarver RN, BSN Nurse Case Pharmacist, [facility]  [phone])  Business Mobile

## 2019-01-11 NOTE — Telephone Encounter (Signed)
STOP Zyprexa.  START 0.25 mg risperidone daily at bedtime.  Be aware of side effects with this medication that may include dizziness, low heart rate, and face twitches.  If face twitches occur, please call clinic and do not give any more doses of medication.   In future if tolerating risperidone, can increase to bid for maintenance.  Could also consider Seroquel, Trazodone, or mirtazapine for same sundowning/behavioral disturbance in future.

## 2019-01-11 NOTE — Telephone Encounter (Signed)
As per daughter Megan Carpenter were given only half a tablet for her agitation and unable to sleep at night but it was giving her hallucination and she tried some natural products but it was not helping her sundown and agitation. Her question Is there any other medication that Dr. Raliegh Ip can Rx for her ?

## 2019-01-11 NOTE — Patient Instructions (Signed)
Thank you allowing the Chronic Care Management Team to be a part of your care! It was a pleasure speaking with you today!   CCM (Chronic Care Management) Team   Stepanie Graver RN, BSN Nurse Care Coordinator  [phone]  Harlow Asa PharmD  Clinical Pharmacist  [phone]  Eula Fried LCSW Clinical Social Worker [phone]  Goals Addressed            This Visit's Progress   . RNCM-I need help caring for mom (pt-stated)       Current Barriers:  . Lacks caregiver support.  . Film/video editor.  . Cognitive Deficits . Patient lives alone with daughter and son checking in on her but has started to have increased anxiety and wandering related to dementia  Nurse Case Manager Clinical Goal(s):  Marland Kitchen Over the next 120 days, caregiver will work with [facility] to address needs related to finding assistance for patient in the home.   Interventions:  . Reviewed medications with patient and discussed how patient did not do well on olanzapine so it was stopped. Patient was started on risperidone today. Daughter to pick this up.  . Discussed plans with patient for ongoing care management follow up and provided patient with direct contact information for care management team . Discussed with daughter ways to increase safety in the home.-Daughter now has placed baby monitor in the home to manage patient's wandering. Police have been called at times by neighbors.  . Empathetic listening listening to daughter as she discussed caregiver stress.  . Daughter did use earlier given resource with [employer] to obtain some respite care so she could visit her father in [place]. She was very thankful for this.  . Daughter has recently hired a Human resources officer to assist with evenings which is the toughest time for her mom.  . Palliative care continues to be involved . Will continue to provide support.     Patient Self Care Activities:  . Currently UNABLE TO independently complete  ADLs or IDALs  Initial goal documentation        The patient verbalized understanding of instructions provided today and declined a print copy of patient instruction materials.   The patient has been provided with contact information for the care management team and has been advised to call with any health related questions or concerns.

## 2019-01-12 ENCOUNTER — Telehealth: Payer: Self-pay

## 2019-01-12 NOTE — Telephone Encounter (Signed)
Patient's daughter Megan Carpenter  Was asking about Ativan if that can work advised her that Ander Purpura has Rx medication she wants her to try for at least week and if that doesn't work she can call palliative care as per Dr.K

## 2019-02-01 ENCOUNTER — Telehealth: Payer: Self-pay

## 2019-02-02 ENCOUNTER — Other Ambulatory Visit: Payer: Self-pay | Admitting: Nurse Practitioner

## 2019-02-02 DIAGNOSIS — F0281 Dementia in other diseases classified elsewhere with behavioral disturbance: Secondary | ICD-10-CM

## 2019-02-02 DIAGNOSIS — F02818 Dementia in other diseases classified elsewhere, unspecified severity, with other behavioral disturbance: Secondary | ICD-10-CM

## 2019-02-11 ENCOUNTER — Telehealth: Payer: Self-pay

## 2019-02-18 ENCOUNTER — Ambulatory Visit: Payer: Self-pay | Admitting: Licensed Clinical Social Worker

## 2019-02-18 DIAGNOSIS — G301 Alzheimer's disease with late onset: Secondary | ICD-10-CM

## 2019-02-18 DIAGNOSIS — F0281 Dementia in other diseases classified elsewhere with behavioral disturbance: Secondary | ICD-10-CM

## 2019-02-18 DIAGNOSIS — F419 Anxiety disorder, unspecified: Secondary | ICD-10-CM

## 2019-02-18 NOTE — Chronic Care Management (AMB) (Signed)
Chronic Care Management    Clinical Social Work Follow Up Note  02/18/2019 Name: Megan Carpenter MRN: [id number] DOB: 1929-02-28  Megan Carpenter is a 85 y.o. year old female who is a primary care patient of Olin Hauser, DO. The CCM team was consulted for assistance with Level of Care Concerns and Caregiver Stress.   Review of patient status, including review of consultants reports, other relevant assessments, and collaboration with appropriate care team members and the patient's provider was performed as part of comprehensive patient evaluation and provision of chronic care management services.    SDOH (Social Determinants of Health) screening performed today: Stress. See Care Plan for related entries.   Advanced Directives Status: <no information> See Care Plan for related entries.   Outpatient Encounter Medications as of 02/18/2019  Medication Sig  . levothyroxine (SYNTHROID) 100 MCG tablet Start with half pill (dose 67mcg) daily before breakfast on empty stomach. After 2 weeks, increase to one whole pill (128mcg) dose daily in morning empty stomach. (Patient taking differently: Take 100 mcg by mouth every evening. Start with half pill (dose 1mcg) daily before breakfast on empty stomach. After 2 weeks, increase to one whole pill (126mcg) dose daily in morning empty stomach.)  . Melatonin 5 MG TABS Take 1 tablet by mouth at bedtime.  . Nutritional Supplements (NUTRITIONAL SUPPLEMENT PO) Take 1 tablet by mouth daily. Relora herbal supplement  . risperiDONE (RISPERDAL) 0.25 MG tablet TAKE 1 TABLET (0.25 MG TOTAL) BY MOUTH AT BEDTIME.   No facility-administered encounter medications on file as of 02/18/2019.      Goals Addressed    . SW- I need more support in the home or LTC placement for mom       Current Barriers:  . Financial constraints related to affording LTC placement . Limited social support . Level of care concerns . ADL IADL limitations . Mental Health Concerns  .  Limited education about Personal Care Service Resources within [facility] . Limited access to caregiver . Cognitive Deficits . Memory Deficits . Inability to perform ADL's independently . Inability to perform IADL's independently  Clinical Social Work Clinical Goal(s):  Marland Kitchen Over the next 90 days, client will work with SW to address concerns related to needing more support in the home or pursing LTC placement  Interventions: . Patient interviewed and appropriate assessments performed . Provided patient with information about Medicaid enrollment and requirements . Discussed plans with patient for ongoing care management follow up and provided patient with direct contact information for care management team . Advised patient to contact CCM program or PCP office for any urgent concerns  . Collaborated with RN Case Manager re: routed encounter . Assisted patient/caregiver with obtaining information about health plan benefits . Provided education and assistance to client regarding Advanced Directives. . A voluntary and extensive discussion about advanced care planning including explanation and discussion of advanced was undertaken with the patient's family.  Explanation regarding healthcare proxy and living will was reviewed and packet with forms with explanation of how to fill them out was given.   . Provided education to patient/caregiver regarding level of care options. . Provided education to patient/caregiver about Hospice and/or Palliative Care services . Family confirmed that patient is already on the wait list for C.H.O.R.E. program through [facility] but pt still has a 1 year left on the wait list. Family is interested in respite care resources in case she has to travel to visit her father in [place]. LCSW  sent secure text message to family with [facility] contact information so that she can check and inquire where pt is at on the wait list. . Family confirm that patient has been  denied Medicaid in the past due to patient's life insurance policy.  Marland Kitchen LCSW sent important DSS key contact numbers to patient's daughter per her request as well. . Provided emotional support to family as they struggle with caregiver burnout symptoms. Support resource education provided.   Patient Self Care Activities:  . Currently UNABLE TO independently take appropriate care of self in the home and is in need of more support or LTC placement  Please see past updates related to this goal by clicking on the "Past Updates" button in the selected goal      Follow Up Plan: SW will follow up with patient by phone over the next quarter  [name], [place], MSW, LCSW [facility]  [facility] [email or web address] Phone: [phone]

## 2019-02-22 ENCOUNTER — Other Ambulatory Visit: Payer: Self-pay | Admitting: Family Medicine

## 2019-02-22 DIAGNOSIS — E034 Atrophy of thyroid (acquired): Secondary | ICD-10-CM

## 2019-02-25 ENCOUNTER — Telehealth: Payer: Self-pay

## 2019-03-01 ENCOUNTER — Other Ambulatory Visit: Payer: Self-pay | Admitting: Family Medicine

## 2019-03-01 DIAGNOSIS — E034 Atrophy of thyroid (acquired): Secondary | ICD-10-CM

## 2019-03-01 DIAGNOSIS — F02818 Dementia in other diseases classified elsewhere, unspecified severity, with other behavioral disturbance: Secondary | ICD-10-CM

## 2019-03-01 DIAGNOSIS — F0281 Dementia in other diseases classified elsewhere with behavioral disturbance: Secondary | ICD-10-CM

## 2019-03-22 ENCOUNTER — Telehealth: Payer: Self-pay

## 2019-03-25 ENCOUNTER — Telehealth: Payer: Self-pay

## 2019-04-02 ENCOUNTER — Telehealth: Payer: Self-pay | Admitting: Adult Health Nurse Practitioner

## 2019-04-02 NOTE — Telephone Encounter (Signed)
Called to schedule appointment.  Left VM with reason for call and call back info Nickalos Petersen K. Rhyland Hinderliter NP 

## 2019-04-05 ENCOUNTER — Ambulatory Visit (INDEPENDENT_AMBULATORY_CARE_PROVIDER_SITE_OTHER): Payer: Medicare Other | Admitting: *Deleted

## 2019-04-05 DIAGNOSIS — F419 Anxiety disorder, unspecified: Secondary | ICD-10-CM

## 2019-04-05 DIAGNOSIS — E038 Other specified hypothyroidism: Secondary | ICD-10-CM | POA: Diagnosis not present

## 2019-04-05 DIAGNOSIS — G301 Alzheimer's disease with late onset: Secondary | ICD-10-CM

## 2019-04-05 DIAGNOSIS — R296 Repeated falls: Secondary | ICD-10-CM

## 2019-04-05 DIAGNOSIS — F02818 Dementia in other diseases classified elsewhere, unspecified severity, with other behavioral disturbance: Secondary | ICD-10-CM

## 2019-04-05 DIAGNOSIS — F0281 Dementia in other diseases classified elsewhere with behavioral disturbance: Secondary | ICD-10-CM

## 2019-04-05 DIAGNOSIS — Z515 Encounter for palliative care: Secondary | ICD-10-CM

## 2019-04-05 DIAGNOSIS — E063 Autoimmune thyroiditis: Secondary | ICD-10-CM | POA: Diagnosis not present

## 2019-04-05 DIAGNOSIS — E43 Unspecified severe protein-calorie malnutrition: Secondary | ICD-10-CM

## 2019-04-05 NOTE — Chronic Care Management (AMB) (Signed)
Chronic Carpenter Management   Follow Up Note   04/05/2019 Name: Megan Carpenter MRN: [id number] DOB: 02-28-1929  Referred by: Megan Cords, DO Reason for referral : Chronic Carpenter Management (Alzheimer's )   Megan Carpenter is a 86 y.o. year old female who is a primary Carpenter patient of Megan Cords, DO. The CCM team was consulted for assistance with chronic disease management and Carpenter coordination needs.    Review of patient status, including review of consultants reports, relevant laboratory and other test results, and collaboration with appropriate Carpenter team members and the patient's provider was performed as part of comprehensive patient evaluation and provision of chronic Carpenter management services.    SDOH (Social Determinants of Health) screening performed today: Financial Strain  Stress. See Carpenter Plan for related entries.   Outpatient Encounter Medications as of 04/05/2019  Medication Sig  . levothyroxine (SYNTHROID) 100 MCG tablet TAKE ONE TABLET BY MOUTH EVERY DAY BEFORE BREAKFAST  . Melatonin 5 MG TABS Take 1 tablet by mouth at bedtime.  . Nutritional Supplements (NUTRITIONAL SUPPLEMENT PO) Take 1 tablet by mouth daily. Relora herbal supplement  . risperiDONE (RISPERDAL) 0.25 MG tablet TAKE 1 TABLET (0.25 MG TOTAL) BY MOUTH AT BEDTIME.   No facility-administered encounter medications on file as of 04/05/2019.     Goals Addressed            This Visit's Progress   . RNCM-I need help caring for mom (pt-stated)       Current Barriers:  . Lacks caregiver support.  . Corporate treasurer.  . Cognitive Deficits . Patient lives alone with daughter and son checking in on her but has started to have increased anxiety and wandering related to dementia  Nurse Case Manager Clinical Goal(s):  Marland Kitchen Over the next 120 days, caregiver will work with Megan Carpenter to address needs related to finding assistance for patient in the home.   Interventions:  . Reviewed medications  with patient and discussed patient doing well on risperidone. It is helping her to rest at night per daughter.  . Discussed plans with patient for ongoing Carpenter management follow up and provided patient with direct contact information for Carpenter management team . Provided patient and/or caregiver with emailed information about Megan Carpenter, and Megan Carpenter  ([employer]). Megan Carpenter [phone] . Megan Carpenter [phone] . [facility]  [phone] . Email sent to [facility] [place] with [facility] to make her aware of this patient's needs.  . Empathetic listening listening to daughter as she discussed financial and caregiver stress.  . Daughter has recently hired a Mining engineer to assist with evenings which is the toughest time for her mom, but financially this is not sustainable for their family so they are continuing to search out for resources.  . Palliative Carpenter continues to be involved- Made daughter aware Megan Holm NP was trying to reach her for scheduling.  . Daughter reports patient is continuing at a steady weight and has not had any more falls.  . Daughter or caregiver is having to provide each meal one meal at a time, if left with food patient will feed it to her dog and not eat.     Patient Self Carpenter Activities:  . Currently UNABLE TO independently complete ADLs or IDALs  Please see past updates related to this goal by clicking on the "Past Updates" button in the selected goal          A HIPPA compliant  phone message was left for the patient providing contact information and requesting a return call.  The Carpenter management team will reach out to the patient again over the next 60 days.    Merlene Morse Olivya Sobol RN, BSN Nurse Case Pharmacist, [facility]  [phone]) Business Mobile

## 2019-04-05 NOTE — Patient Instructions (Signed)
Thank you allowing the Chronic Care Management Team to be a part of your care! It was a pleasure speaking with you today!   CCM (Chronic Care Management) Team   Dafina Suk RN, BSN Nurse Care Coordinator  [phone]  Harlow Asa PharmD  Clinical Pharmacist  [phone]  Eula Fried LCSW Clinical Social Worker [phone]  Goals Addressed            This Visit's Progress   . RNCM-I need help caring for mom (pt-stated)       Current Barriers:  . Lacks caregiver support.  . Film/video editor.  . Cognitive Deficits . Patient lives alone with daughter and son checking in on her but has started to have increased anxiety and wandering related to dementia  Nurse Case Manager Clinical Goal(s):  Marland Kitchen Over the next 120 days, caregiver will work with [facility] to address needs related to finding assistance for patient in the home.   Interventions:  . Reviewed medications with patient and discussed patient doing well on risperidone. It is helping her to rest at night per daughter.  . Discussed plans with patient for ongoing care management follow up and provided patient with direct contact information for care management team . Provided patient and/or caregiver with emailed information about [place], and [employer]  ([employer] . [place] [phone] . [employer] [phone] . [facility]  [phone] . Email sent to [facility] with [place] to make her aware of this patient's needs.  . Empathetic listening listening to daughter as she discussed financial and caregiver stress.  . Daughter has recently hired a Human resources officer to assist with evenings which is the toughest time for her mom, but financially this is not sustainable for their family so they are continuing to search out for resources.  . Palliative care continues to be involved- Made daughter aware Hanley Ben NP was trying to reach her for  scheduling.  . Daughter reports patient is continuing at a steady weight and has not had any more falls.  . Daughter or caregiver is having to provide each meal one meal at a time, if left with food patient will feed it to her dog and not eat.     Patient Self Care Activities:  . Currently UNABLE TO independently complete ADLs or IDALs  Please see past updates related to this goal by clicking on the "Past Updates" button in the selected goal         The patient verbalized understanding of instructions provided today and declined a print copy of patient instruction materials. Museum/gallery conservator provided.   The patient has been provided with contact information for the care management team and has been advised to call with any health related questions or concerns.

## 2019-04-29 ENCOUNTER — Telehealth: Payer: Self-pay

## 2019-05-11 ENCOUNTER — Telehealth: Payer: Self-pay

## 2019-05-13 ENCOUNTER — Telehealth: Payer: Self-pay

## 2019-06-01 ENCOUNTER — Telehealth: Payer: Self-pay | Admitting: Family Medicine

## 2019-06-01 NOTE — Telephone Encounter (Signed)
Patient's daughter Lynden Ang was asking if Dr.K can Rx Risperdal twice daily since patient's Anxiety is getting worst during day time now as well, advised her that she might need to schedule a virtual appt.

## 2019-06-01 NOTE — Telephone Encounter (Signed)
Yes, she will need virtual visit. However - I would FIRST ask them to contact Palliative Care for follow-up visit first if they are able to. And then I can see her for virtual visit.  Last I see on chart was 11/2018 with Palliative.  They were the ones that helped recommend medication like Risperdal I believe originally.  They would make medication changes or recommendations for me to prescribe if it was needed.  I don't normally use Risperdal or those type of medicines otherwise.  Saralyn Pilar, DO [facility] 06/01/2019, 4:44 PM

## 2019-06-02 NOTE — Telephone Encounter (Signed)
Patient advised.

## 2019-06-07 ENCOUNTER — Telehealth: Payer: Self-pay | Admitting: Adult Health Nurse Practitioner

## 2019-06-07 NOTE — Telephone Encounter (Signed)
Returning daughter's call about medication her mother is on.  Left VM with reason for call and call back info. Yaslin Kirtley K. Garner Nash NP

## 2019-06-09 ENCOUNTER — Ambulatory Visit (INDEPENDENT_AMBULATORY_CARE_PROVIDER_SITE_OTHER): Payer: Medicare Other | Admitting: Family Medicine

## 2019-06-09 ENCOUNTER — Other Ambulatory Visit: Payer: Self-pay

## 2019-06-09 ENCOUNTER — Other Ambulatory Visit: Payer: Medicare Other | Admitting: Adult Health Nurse Practitioner

## 2019-06-09 ENCOUNTER — Encounter: Payer: Self-pay | Admitting: Family Medicine

## 2019-06-09 VITALS — BP 140/111 | HR 72 | Temp 98.0°F | Resp 16 | Ht 62.0 in | Wt 99.6 lb

## 2019-06-09 DIAGNOSIS — E43 Unspecified severe protein-calorie malnutrition: Secondary | ICD-10-CM

## 2019-06-09 DIAGNOSIS — Z515 Encounter for palliative care: Secondary | ICD-10-CM

## 2019-06-09 DIAGNOSIS — H9193 Unspecified hearing loss, bilateral: Secondary | ICD-10-CM

## 2019-06-09 DIAGNOSIS — E038 Other specified hypothyroidism: Secondary | ICD-10-CM

## 2019-06-09 DIAGNOSIS — G301 Alzheimer's disease with late onset: Secondary | ICD-10-CM | POA: Diagnosis not present

## 2019-06-09 DIAGNOSIS — F02818 Dementia in other diseases classified elsewhere, unspecified severity, with other behavioral disturbance: Secondary | ICD-10-CM

## 2019-06-09 DIAGNOSIS — E063 Autoimmune thyroiditis: Secondary | ICD-10-CM

## 2019-06-09 DIAGNOSIS — F0281 Dementia in other diseases classified elsewhere with behavioral disturbance: Secondary | ICD-10-CM

## 2019-06-09 DIAGNOSIS — Z111 Encounter for screening for respiratory tuberculosis: Secondary | ICD-10-CM

## 2019-06-09 MED ORDER — RISPERIDONE 0.25 MG PO TABS: 0.2500 mg | ORAL_TABLET | Freq: Two times a day (BID) | ORAL | 1 refills | Status: DC

## 2019-06-09 NOTE — Progress Notes (Signed)
Subjective:    Patient ID: Megan Carpenter, female    DOB: Jan 19, 1929, 86 y.o.   MRN: [id number]  Megan Carpenter is a 86 y.o. female presenting on 06/09/2019 for [facility]   HPI   Daughter Larene Beach provided history. Patient has dementiaand limited verbal input.   DEMENTIA, with behavior disturbances SevereProtein Calorie Malnutritionwith Weight Loss Last visit on 12/2018,reviewedsame problemwithChronic dementia, Alzheimer's type,progressive decline, primarily managed by daughter as caregiver, see prior notes for background information. - Interval updates - still gradual worsening decline in cognitive status and behavioral issues, some agitation, wandering. Denies violent or aggressive behavior. - Update now she has a bed at care home [facility], needs FL2 and TB Skin test - Has been several months since last palliative provider, they have not received their calls lately - Since last visit has started Risperidone 0.25mg  nightly with good results, limiting behavioral disturbances and allow patient to rest at night. Asking if can take it twice a day to help with daytime behavior, especially with going to Care Home - Still with worsening sun-downing - Patient needs assistance with most ADL  Help bathing and shower, she needs assistance with meal prep, but can eat but still Often requires assistance with feeding, and needs assistance with clothing but can dress - Very severe caregiver stress and work - Regarding communication - her verbal communication is limited, reduced words and phrases, she often has difficulty comprehending tasks or requests. - Denies significant mood changes, depression, no harm to others, dysuria, hematuria, nausea vomiting , fever chills    Depression screen [facility] 2/9 11/03/2018 03/30/2018 09/25/2017  Decreased Interest 0 0 0  Down, Depressed, Hopeless 0 0 0  PHQ - 2 Score 0 0 0    Social History   Tobacco Use  . Smoking status: Never Smoker  .  Smokeless tobacco: Never Used  Substance Use Topics  . Alcohol use: No  . Drug use: No    Review of Systems Per HPI unless specifically indicated above     Objective:    BP (!) 140/111   Pulse 72   Temp 98 F (36.7 C) (Oral)   Resp 16   Ht 5\' 2"  (1.575 m)   Wt 99 lb 9.6 oz (45.2 kg)   BMI 18.22 kg/m   Wt Readings from Last 3 Encounters:  06/09/19 99 lb 9.6 oz (45.2 kg)  12/15/18 99 lb 3.2 oz (45 kg)  11/28/18 100 lb (45.4 kg)    Physical Exam Vitals and nursing note reviewed.  Constitutional:      General: She is not in acute distress.    Appearance: She is well-developed. She is not diaphoretic.     Comments: Thin appearing, mostly comfortable, cooperative  HENT:     Head: Normocephalic and atraumatic.  Eyes:     General:        Right eye: No discharge.        Left eye: No discharge.     Conjunctiva/sclera: Conjunctivae normal.  Cardiovascular:     Rate and Rhythm: Normal rate and regular rhythm.     Heart sounds: Normal heart sounds. No murmur.  Pulmonary:     Effort: Pulmonary effort is normal. No respiratory distress.     Breath sounds: Normal breath sounds. No wheezing or rales.  Abdominal:     General: There is no distension.     Palpations: Abdomen is soft. There is no mass.     Tenderness: There is  no abdominal tenderness.     Comments: Hyperactive bowel sounds  Musculoskeletal:        General: Normal range of motion.     Cervical back: Normal range of motion and neck supple.     Comments: Muscle atrophy and muscle wasting facial and generalized  Lymphadenopathy:     Cervical: No cervical adenopathy.  Skin:    General: Skin is warm and dry.     Findings: No erythema or rash.  Neurological:     Mental Status: She is alert.     Comments: Disoriented  Psychiatric:        Behavior: Behavior normal.     Comments: Hygiene is good, well dressed, not always making eye contact, she is verbal but has limited speech, only few words, partial answer response  only. She has poor insight into her medical condition and mental health. Follows some commands.        Results for orders placed or performed in visit on 12/15/18  T4, free  Result Value Ref Range   Free T4 1.9 (H) 0.8 - 1.8 ng/dL  COMPLETE METABOLIC PANEL WITH GFR  Result Value Ref Range   Glucose, Bld 145 (H) 65 - 99 mg/dL   BUN 13 7 - 25 mg/dL   Creat 6.19 5.09 - 3.26 mg/dL   GFR, Est Non African American 64 > OR = 60 mL/min/1.57m2   GFR, Est African American 75 > OR = 60 mL/min/1.48m2   BUN/Creatinine Ratio NOT APPLICABLE 6 - 22 (calc)   Sodium 141 135 - 146 mmol/L   Potassium 4.3 3.5 - 5.3 mmol/L   Chloride 105 98 - 110 mmol/L   CO2 26 20 - 32 mmol/L   Calcium 9.2 8.6 - 10.4 mg/dL   Total Protein 6.6 6.1 - 8.1 g/dL   Albumin 3.9 3.6 - 5.1 g/dL   Globulin 2.7 1.9 - 3.7 g/dL (calc)   AG Ratio 1.4 1.0 - 2.5 (calc)   Total Bilirubin 0.8 0.2 - 1.2 mg/dL   Alkaline phosphatase (APISO) 77 37 - 153 U/L   AST 17 10 - 35 U/L   ALT 11 6 - 29 U/L  TSH  Result Value Ref Range   TSH 7.29 (H) 0.40 - 4.50 mIU/L      Assessment & Plan:   Problem List Items Addressed This Visit    Severe protein-calorie malnutrition (HCC)   Palliative care patient   Late onset Alzheimer's disease with behavioral disturbance (HCC) - Primary    Chronic progressive decline w/ Alzheimer's dementia, clinically FAST Stage 6 - At risk of progression to FAST 7 but still has some retained verbal communication - History discharged from hospice due to weight gain Increasing assistance with ADL needed and worsening behavior and wandering, may increase risk to patient. - Increased caregiver burden, limited financial and availability - Meds: prior on Aricept, now off  Plan: 1. WIll complete FL2 and TB PPD today to proceed w/ Care Home placement as arranged already - Discussed case with Palliative Care [facility] - [name] NP, and we agree with caregiver request to adjust Risperidone, will add one  additional dose daily, for Risperidone 0.25mg  BID now, in future we have option to add PRN dose or inc to 0.5 BID - Palliative will continue to follow  - Follow-up as needed, if dramatic worsening or concern for her own safety then she should go to hospital ED for evaluation      Relevant Medications   risperiDONE (RISPERDAL)  0.25 MG tablet   Hypothyroidism   Hearing reduced, bilateral    Other Visit Diagnoses    Visit for TB skin test       Relevant Orders   PPD (Completed)      Meds ordered this encounter  Medications  . risperiDONE (RISPERDAL) 0.25 MG tablet    Sig: Take 1 tablet (0.25 mg total) by mouth 2 (two) times daily before lunch and supper.    Dispense:  180 tablet    Refill:  1      Follow up plan: Return in about 2 days (around 06/11/2019) for TB Skin Test.   Nobie Putnam, DO [facility] 06/09/2019, 3:24 PM

## 2019-06-09 NOTE — Patient Instructions (Addendum)
Thank you for coming to the office today.  Call [facility] to keep in touch regarding medication management for [facility] [address] [place], [place] [address] [phone]  I will call them as soon as possible to ask about Risperidone. We may double dose or add extra dose. Stay tuned.  I do not recommend using Lorazepam (Ativan) at this time, because this is a very high risk medication, especially elderly patient it can cause increased fall risk and worsening confusion at times.   Please schedule a Follow-up Appointment to: Return in about 2 days (around 06/11/2019) for TB Skin Test.  If you have any other questions or concerns, please feel free to call the office or send a message through MyChart. You may also schedule an earlier appointment if necessary.  Additionally, you may be receiving a survey about your experience at our office within a few days to 1 week by e-mail or mail. We value your feedback.  Saralyn Pilar, DO [facility], [place]

## 2019-06-09 NOTE — Progress Notes (Signed)
    Therapist, nutritional Palliative Care Consult Note Telephone: [phone]  Fax: [phone]  PATIENT NAME: Megan Carpenter DOB: 12/03/1928 MRN: [id number]  PRIMARY CARE PROVIDER:   Smitty Cords, DO  REFERRING PROVIDER:  Smitty Cords, DO [address] [place],  [place] [address]  RESPONSIBLE PARTY:   Daughter, Megan Carpenter [phone]    RECOMMENDATIONS and PLAN:  This was a visit for medication management initiated by PCP.  Patient has late onset Alzheimer's dementia and is currently on risperidone 0.25 mg QHS for anxiety/ agitation.  Patient is going to be transitioning from home to [facility] soon.  She has been having breakthrough agitation.  Discussed increasing her risperidone to 0.25 mg BID.  Unsure if Dr. Althea Charon will continue to be her PCP.  Have attempted earlier this week and in the past to contact the daughter and left call back info for return call. Will continue to reach out to daughter to see if she wants to keep palliative services with the transition.  Dr. Althea Charon encouraged to call with any questions or concerns.  I spent 20 minutes providing this consultation,  from 4:30 to 4:50. More than 50% of the time in this consultation was spent coordinating communication.   HISTORY OF PRESENT ILLNESS:  Megan Carpenter is a 86 y.o. year old female with multiple medical problems including alzheimer's dementia, abnormal weight loss, hypertension, hypothyroidism, GERD, paralyzed vocal cords. Palliative Care was asked to help address goals of care.   CODE STATUS: full code  PPS:  HOSPICE ELIGIBILITY/DIAGNOSIS: TBD  PHYSICAL EXAM:   Deferred   PAST MEDICAL HISTORY:  Past Medical History:  Diagnosis Date  . GERD (gastroesophageal reflux disease)   . Hypertension   . Thyroid disease     SOCIAL HX:  Social History   Tobacco Use  . Smoking status: Never Smoker  . Smokeless tobacco: Never Used  Substance  Use Topics  . Alcohol use: No    ALLERGIES:  Allergies  Allergen Reactions  . Penicillins Hives     PERTINENT MEDICATIONS:  Outpatient Encounter Medications as of 06/09/2019  Medication Sig  . levothyroxine (SYNTHROID) 100 MCG tablet TAKE ONE TABLET BY MOUTH EVERY DAY BEFORE BREAKFAST  . Melatonin 5 MG TABS Take 1 tablet by mouth at bedtime.  . Nutritional Supplements (NUTRITIONAL SUPPLEMENT PO) Take 1 tablet by mouth daily. Relora herbal supplement  . risperiDONE (RISPERDAL) 0.25 MG tablet Take 1 tablet (0.25 mg total) by mouth 2 (two) times daily before lunch and supper.   No facility-administered encounter medications on file as of 06/09/2019.     Megan Omara Marlena Clipper, NP

## 2019-06-10 ENCOUNTER — Telehealth: Payer: Self-pay

## 2019-06-10 NOTE — Assessment & Plan Note (Signed)
Chronic progressive decline w/ Alzheimer's dementia, clinically FAST Stage 6 - At risk of progression to FAST 7 but still has some retained verbal communication - History discharged from hospice due to weight gain Increasing assistance with ADL needed and worsening behavior and wandering, may increase risk to patient. - Increased caregiver burden, limited financial and availability - Meds: prior on Aricept, now off  Plan: 1. WIll complete FL2 and TB PPD today to proceed w/ Care Home placement as arranged already - Discussed case with Palliative Care [facility] - [name] NP, and we agree with caregiver request to adjust Risperidone, will add one additional dose daily, for Risperidone 0.25mg  BID now, in future we have option to add PRN dose or inc to 0.5 BID - Palliative will continue to follow  - Follow-up as needed, if dramatic worsening or concern for her own safety then she should go to hospital ED for evaluation

## 2019-06-11 LAB — TB SKIN TEST
Induration: 0 mm
TB Skin Test: NEGATIVE

## 2019-06-21 ENCOUNTER — Ambulatory Visit (INDEPENDENT_AMBULATORY_CARE_PROVIDER_SITE_OTHER): Payer: Medicare Other | Admitting: General Practice

## 2019-06-21 ENCOUNTER — Telehealth: Payer: Self-pay | Admitting: General Practice

## 2019-06-21 DIAGNOSIS — G301 Alzheimer's disease with late onset: Secondary | ICD-10-CM | POA: Diagnosis not present

## 2019-06-21 DIAGNOSIS — F419 Anxiety disorder, unspecified: Secondary | ICD-10-CM

## 2019-06-21 DIAGNOSIS — F02818 Dementia in other diseases classified elsewhere, unspecified severity, with other behavioral disturbance: Secondary | ICD-10-CM

## 2019-06-21 DIAGNOSIS — F0281 Dementia in other diseases classified elsewhere with behavioral disturbance: Secondary | ICD-10-CM

## 2019-06-21 DIAGNOSIS — Z515 Encounter for palliative care: Secondary | ICD-10-CM

## 2019-06-21 DIAGNOSIS — E440 Moderate protein-calorie malnutrition: Secondary | ICD-10-CM

## 2019-06-21 NOTE — Patient Instructions (Signed)
Visit Information  Goals Addressed            This Visit's Progress    RNCM-I need help caring for mom (pt-stated)       Current Barriers:   Lacks caregiver support.   Corporate treasurer.   Cognitive Deficits  Patient lives alone with daughter and son checking in on her but has started to have increased anxiety and wandering related to dementia-per the daughter the patient is transition to an assisted living. Waiting on paperwork to be finalized.  Nurse Case Manager Clinical Goal(s):   Over the next 120 days, caregiver will work with [facility] to address needs related to finding assistance for patient in the home.   Interventions:   Reviewed medications with patient and discussed patient doing well on risperidone. It is helping her to rest at night per daughter. They have cameras where they can check in on her and call her and they have a lady that comes in and helps making sure she is taking her medications.  They are hopeful by 06-28-2019 that she will be able to go to [facility].They are holding her a bed currently while the family gets paperwork done and finalized.   Discussed plans with patient for ongoing care management follow up and provided patient with direct contact information for care management team  Provided patient and/or caregiver with emailed information about [facility], and [facility]  ([employer]). Completed  [facility] [phone]  [facility] [phone]  [facility]  [phone]  Email sent to [facility] with [facility] to make her aware of this patient's needs. -Completed   Empathetic listening listening to daughter as she discussed financial and caregiver stress. The daughter wants to make sure her mom is somewhere safe.   Daughter has recently hired a Mining engineer to assist with evenings which is the toughest time for her mom, but financially this is not  sustainable for their family so they are continuing to search out for resources. Are trying to get the [facility] into [facility]   Palliative care continues to be involved- Made daughter aware Angelique Holm NP was trying to reach her for scheduling. - completed   Evaluation: Daughter reports patient is continuing at a steady weight and has not had any more falls.   Evaluation: Daughter or caregiver is having to provide each meal one meal at a time, if left with food patient will feed it to her dog and not eat.   Education and support provided to the daughter. Reminded the daughter to call the insurance company regularly for updates to the paperwork and process.    Patient Self Care Activities:   Currently UNABLE TO independently complete ADLs or IDALs  Please see past updates related to this goal by clicking on the "Past Updates" button in the selected goal         Patient verbalizes understanding of instructions provided today.   The care management team will reach out to the patient again over the next 60 days.   Alto Denver RN, MSN, CCM Community Care Coordinator [place]   [facility] [place] Mobile: [phone]

## 2019-06-21 NOTE — Chronic Care Management (AMB) (Signed)
Chronic Care Management   Follow Up Note   06/21/2019 Name: ANDRIKA PERAZA MRN: [id number] DOB: 17-Jan-1929  Referred by: Smitty Cords, DO Reason for referral : Chronic Care Management (Dementia, Hypothyroidism, HTN/Falls- long time care)   PETRITA BLUNCK is a 86 y.o. year old female who is a primary care patient of Smitty Cords, DO. The CCM team was consulted for assistance with chronic disease management and care coordination needs.    Review of patient status, including review of consultants reports, relevant laboratory and other test results, and collaboration with appropriate care team members and the patient's provider was performed as part of comprehensive patient evaluation and provision of chronic care management services.    SDOH (Social Determinants of Health) assessments performed: No See Care Plan activities for detailed interventions related to [facility])     Outpatient Encounter Medications as of 06/21/2019  Medication Sig  . levothyroxine (SYNTHROID) 100 MCG tablet TAKE ONE TABLET BY MOUTH EVERY DAY BEFORE BREAKFAST  . Melatonin 5 MG TABS Take 1 tablet by mouth at bedtime.  . Nutritional Supplements (NUTRITIONAL SUPPLEMENT PO) Take 1 tablet by mouth daily. Relora herbal supplement  . risperiDONE (RISPERDAL) 0.25 MG tablet Take 1 tablet (0.25 mg total) by mouth 2 (two) times daily before lunch and supper.   No facility-administered encounter medications on file as of 06/21/2019.     Objective:   Goals Addressed            This Visit's Progress   . RNCM-I need help caring for mom (pt-stated)       Current Barriers:  . Lacks caregiver support.  . Corporate treasurer.  . Cognitive Deficits . Patient lives alone with daughter and son checking in on her but has started to have increased anxiety and wandering related to dementia-per the daughter the patient is transition to an assisted living. Waiting on paperwork to be finalized.  Nurse Case  Manager Clinical Goal(s):  Marland Kitchen Over the next 120 days, caregiver will work with [facility] to address needs related to finding assistance for patient in the home.   Interventions:  . Reviewed medications with patient and discussed patient doing well on risperidone. It is helping her to rest at night per daughter. They have cameras where they can check in on her and call her and they have a lady that comes in and helps making sure she is taking her medications.  They are hopeful by 06-28-2019 that she will be able to go to [facility].They are holding her a bed currently while the family gets paperwork done and finalized.  . Discussed plans with patient for ongoing care management follow up and provided patient with direct contact information for care management team . Provided patient and/or caregiver with emailed information about [facility], and [facility]  ([employer]). Completed . [facility] [phone] . [facility] [phone] . [facility] Pall[facility]  [phone] . Email sent to [facility] with [facility] to make her aware of this patient's needs. -Completed  . Empathetic listening listening to daughter as she discussed financial and caregiver stress. The daughter wants to make sure her mom is somewhere safe.  . Daughter has recently hired a Mining engineer to assist with evenings which is the toughest time for her mom, but financially this is not sustainable for their family so they are continuing to search out for resources. Are trying to get the [facility] into [facility]  . Palliative care continues to be involved- Made daughter aware Hanley Ben NP was trying to reach her for scheduling. - completed  . Evaluation: Daughter reports patient is continuing at a steady weight and has not had any more falls.  . Evaluation: Daughter or caregiver is having to provide each meal one meal at a time, if  left with food patient will feed it to her dog and not eat.  . Education and support provided to the daughter. Reminded the daughter to call the insurance company regularly for updates to the paperwork and process.    Patient Self Care Activities:  . Currently UNABLE TO independently complete ADLs or IDALs  Please see past updates related to this goal by clicking on the "Past Updates" button in the selected goal          Plan:   The care management team will reach out to the patient again over the next 60 days.    Noreene Larsson RN, MSN, [place] Mobile: [phone]

## 2019-07-05 ENCOUNTER — Other Ambulatory Visit: Payer: Self-pay

## 2019-07-05 ENCOUNTER — Ambulatory Visit (INDEPENDENT_AMBULATORY_CARE_PROVIDER_SITE_OTHER): Payer: Medicare Other

## 2019-07-05 DIAGNOSIS — Z111 Encounter for screening for respiratory tuberculosis: Secondary | ICD-10-CM | POA: Diagnosis not present

## 2019-07-05 NOTE — Progress Notes (Signed)
Patient was in the office to get the second PPD test.

## 2019-07-07 LAB — TB SKIN TEST
Induration: 0 mm
TB Skin Test: NEGATIVE

## 2019-07-08 ENCOUNTER — Telehealth: Payer: Self-pay

## 2019-07-08 NOTE — Telephone Encounter (Signed)
Typically we receive a paperwork packet with med list and diet and OTC med list and we can review and update and change or add. I did not receive this yet.  If Larene Beach has this paperwork she can send to [place] and we may need a virtual telephone visit to complete it but otherwise we should be able to update it witht he Relora Calm supplement PRN and update the Regular Diet and also sign the standing orders.  If she doesn't have the required paperwork, we would need to contact the nursing home and have them fax [place] the documents or explain how we can make these changes.  Saralyn Pilar, DO [facility] 07/08/2019, 12:26 PM

## 2019-07-08 NOTE — Telephone Encounter (Signed)
Patient's daughter Larene Beach called since patient is in nursing home now and they want in a letterhead stating patient is on regular diet also she was giving her Relora calm PRN which is OTC --vitamin drug store medicine but nursing home won't give it to her since it is not in her active med list so can this be added on her FL2 form. She was asking for standing order where Dr.K can sign for cough, nausea, diarrhea, constipation, HA med's like Pepto Bismol.  Advised Vickie to bring this forms in the office before he can sign.

## 2019-07-08 NOTE — Telephone Encounter (Signed)
Vickie will bring the paper work.

## 2019-07-09 NOTE — Telephone Encounter (Signed)
Paperwork will be filled out and Vickie will pick it up by Monday.

## 2019-07-14 ENCOUNTER — Other Ambulatory Visit: Payer: Self-pay | Admitting: Family Medicine

## 2019-07-14 DIAGNOSIS — F5104 Psychophysiologic insomnia: Secondary | ICD-10-CM

## 2019-07-22 ENCOUNTER — Encounter: Payer: Self-pay | Admitting: Family Medicine

## 2019-07-22 ENCOUNTER — Other Ambulatory Visit: Payer: Self-pay | Admitting: Family Medicine

## 2019-07-22 DIAGNOSIS — F0281 Dementia in other diseases classified elsewhere with behavioral disturbance: Secondary | ICD-10-CM

## 2019-07-22 DIAGNOSIS — G301 Alzheimer's disease with late onset: Secondary | ICD-10-CM

## 2019-07-22 NOTE — Progress Notes (Unsigned)
Updated FL2 today 07/22/19 at request to add rx for herbal supplements Relora Calm capsule daily PRN anxiety or agitation, and Valerian Root 450mg  daily PRN anxiety or agitation. Handwritten rx for each for Vickie to pick up and new printed signed FL2. She will work with [employer] and pharmacy for getting these for her.  , DO [facility] 07/22/2019, 12:29 PM

## 2019-08-02 ENCOUNTER — Telehealth: Payer: Self-pay | Admitting: Adult Health Nurse Practitioner

## 2019-08-02 NOTE — Telephone Encounter (Signed)
Called daughter to schedule appt.  Left VM with reason for call and contact info.   There is question for documentation that she may have moved to [facility].  Looking into this with help of pall admin Abdoulie Tierce K. Garner Nash NP

## 2019-08-10 ENCOUNTER — Ambulatory Visit: Payer: Self-pay

## 2019-08-10 NOTE — Chronic Care Management (AMB) (Signed)
  Care Management   Follow Up Note   08/10/2019 Name: Megan Carpenter MRN: [id number] DOB: 1928/08/01  Referred by: Smitty Cords, DO Reason for referral : Care Coordination   Megan Carpenter is a 86 y.o. year old female who is a primary care patient of Smitty Cords, DO. The care management team was consulted for assistance with care management and care coordination needs.    Review of patient status, including review of consultants reports, relevant laboratory and other test results, and collaboration with appropriate care team members and the patient's provider was performed as part of comprehensive patient evaluation and provision of chronic care management services.    LCSW completed CCM outreach attempt today but was unable to reach patient successfully. A HIPPA compliant voice message was left encouraging patient to return call once available. LCSW rescheduled CCM SW appointment as well.  A HIPPA compliant phone message was left for the patient providing contact information and requesting a return call.   Megan Carpenter, BSW, MSW, LCSW [facility]  [facility] [email or web address] Phone: [phone]

## 2019-08-19 ENCOUNTER — Ambulatory Visit (INDEPENDENT_AMBULATORY_CARE_PROVIDER_SITE_OTHER): Payer: Medicare Other | Admitting: General Practice

## 2019-08-19 ENCOUNTER — Telehealth: Payer: Self-pay | Admitting: General Practice

## 2019-08-19 DIAGNOSIS — F0281 Dementia in other diseases classified elsewhere with behavioral disturbance: Secondary | ICD-10-CM | POA: Diagnosis not present

## 2019-08-19 DIAGNOSIS — G301 Alzheimer's disease with late onset: Secondary | ICD-10-CM | POA: Diagnosis not present

## 2019-08-19 DIAGNOSIS — I10 Essential (primary) hypertension: Secondary | ICD-10-CM | POA: Diagnosis not present

## 2019-08-19 DIAGNOSIS — F419 Anxiety disorder, unspecified: Secondary | ICD-10-CM

## 2019-08-19 DIAGNOSIS — F02818 Dementia in other diseases classified elsewhere, unspecified severity, with other behavioral disturbance: Secondary | ICD-10-CM

## 2019-08-19 DIAGNOSIS — Z515 Encounter for palliative care: Secondary | ICD-10-CM

## 2019-08-19 NOTE — Chronic Care Management (AMB) (Signed)
Chronic Care Management   Follow Up Note   08/19/2019 Name: Megan Carpenter MRN: [id number] DOB: 01/25/1929  Referred by: Smitty Cords, DO Reason for referral : Chronic Care Management (Follow up: HTN/ Alzheimers disease/anxity- other concerns)   Megan Carpenter is a 86 y.o. year old female who is a primary care patient of Smitty Cords, DO. The CCM team was consulted for assistance with chronic disease management and care coordination needs.    Review of patient status, including review of consultants reports, relevant laboratory and other test results, and collaboration with appropriate care team members and the patient's provider was performed as part of comprehensive patient evaluation and provision of chronic care management services.    SDOH (Social Determinants of Health) assessments performed: Yes- the patient is safe and living at an assisted living facility currently See Care Plan activities for detailed interventions related to [facility])     Outpatient Encounter Medications as of 08/19/2019  Medication Sig   levothyroxine (SYNTHROID) 100 MCG tablet TAKE ONE TABLET BY MOUTH EVERY DAY BEFORE BREAKFAST   melatonin 5 MG TABS TAKE 1 TABLET BY MOUTH DAILY AT BEDTIME   Nutritional Supplements (NUTRITIONAL SUPPLEMENT PO) Take 1 tablet by mouth daily. Relora herbal supplement   risperiDONE (RISPERDAL) 0.25 MG tablet Take 1 tablet (0.25 mg total) by mouth 2 (two) times daily before lunch and supper.   Valerian Root 450 MG CAPS Take 1 capsule (450 mg total) by mouth daily as needed (anxiety or agitation).   No facility-administered encounter medications on file as of 08/19/2019.     Objective:   Goals Addressed            This Visit's Progress    RNCM-I need help caring for mom (pt-stated)       Current Barriers:   Lacks caregiver support.   Corporate treasurer.   Cognitive Deficits  Patient is now living at an assisted living: [facility].  She  has been there since the last week of March.    Nurse Case Manager Clinical Goal(s):   Over the next 120 days, caregiver will work with [facility] to address needs related to finding assistance for patient in the home.   Interventions:   Evaluation of Living arrangements:  She has gone to  [employer] and [employer].  The patient is doing well and adjusting. The daughter is happy that she is doing well. At first she kept asking to go home but she is doing much better now.   Discussed plans with patient for ongoing care management follow up and provided patient with direct contact information for care management team  Provided patient and/or caregiver with emailed information about [facility], and [facility]  ([employer]). Completed  [facility] [phone]  [facility] [phone]  [facility]  [phone]  Email sent to [facility] with [facility] to make her aware of this patient's needs. -Completed   Empathetic listening listening to daughter as she discussed financial and caregiver stress. The daughter wants to make sure her mom is somewhere safe.   Palliative care continues to be involved- Made daughter aware Angelique Holm NP was trying to reach her for scheduling. - completed   Evaluation: Daughter reports patient is continuing at a steady weight and has not had any more falls.   Education and support provided to the daughter. Reminded the daughter to call the insurance company regularly for updates to the paperwork and process.  Patient Self Care Activities:   Currently UNABLE TO independently complete ADLs or IDALs  Please see past updates related to this goal by clicking on the "Past Updates" button in the selected goal       RNCM: Pt's daughter- "She has what needs right now" (pt-stated)       CARE PLAN ENTRY (see longtitudinal plan of care for additional care plan information)  Current Barriers:    Chronic Disease Management support, education, and care coordination needs related to HTN, Anxiety, and Alzheimer's  Clinical Goal(s) related to HTN, Anxiety, and Alzheimer's :  Over the next 120 days, patient will:   Work with the care management team to address educational, disease management, and care coordination needs   Begin or continue self health monitoring activities as directed today Measure and record blood pressure 1 times per week and adhere to a heart healthy diet  Call provider office for new or worsened signs and symptoms Blood pressure findings outside established parameters and New or worsened symptom related to Anxiety/Alzheimer's and other chronic conditions  Call care management team with questions or concerns  Verbalize basic understanding of patient centered plan of care established today  Interventions related to HTN, Anxiety, and Alzheimer's :   Evaluation of current treatment plans and patient's adherence to plan as established by provider  Assessed patient understanding of disease states.  Vickie, the patients daughter verbalized understanding of chronic conditions and how well the patient is doing at the new facility  Assessed patient's education and care coordination needs.  The patient has what she needs at the assisted living. Adjusting well. The daughter knows she can call the [facility] or pcp for questions, needs or concerns.  Provided disease specific education to patient.  Education and support given.   Collaborated with appropriate clinical care team members regarding patient needs  Patient Self Care Activities related to HTN, Anxiety, and Alzheimer's :   Patient is unable to independently self-manage chronic health conditions  Initial goal documentation         Plan:   The care management team will reach out to the patient again over the next 60 to 90  days.    Noreene Larsson RN, MSN, [place] Mobile: [phone]

## 2019-08-19 NOTE — Patient Instructions (Signed)
Visit Information  Goals Addressed            This Visit's Progress   . RNCM-I need help caring for mom (pt-stated)       Current Barriers:  . Lacks caregiver support.  . Corporate treasurer.  . Cognitive Deficits . Patient is now living at an assisted living: [facility].  She has been there since the last week of March.    Nurse Case Manager Clinical Goal(s):  Marland Kitchen Over the next 120 days, caregiver will work with [facility] to address needs related to finding assistance for patient in the home.   Interventions:  . Evaluation of Living arrangements:  She has gone to  [employer] [facility].  The patient is doing well and adjusting. The daughter is happy that she is doing well. At first she kept asking to go home but she is doing much better now.  . Discussed plans with patient for ongoing care management follow up and provided patient with direct contact information for care management team . Provided patient and/or caregiver with emailed information about [facility], and [facility]  ([employer]). Completed . [facility] [phone] . [facility] [phone] . [facility] Palliative  [phone] . Email sent to [facility] with [facility] to make her aware of this patient's needs. -Completed  . Empathetic listening listening to daughter as she discussed financial and caregiver stress. The daughter wants to make sure her mom is somewhere safe.  . Palliative care continues to be involved- Made daughter aware Angelique Holm NP was trying to reach her for scheduling. - completed  . Evaluation: Daughter reports patient is continuing at a steady weight and has not had any more falls.  . Education and support provided to the daughter. Reminded the daughter to call the insurance company regularly for updates to the paperwork and process.   Patient Self Care Activities:  . Currently UNABLE TO independently complete ADLs or IDALs  Please  see past updates related to this goal by clicking on the "Past Updates" button in the selected goal      . RNCM: Pt's daughter- "She has what needs right now" (pt-stated)       CARE PLAN ENTRY (see longtitudinal plan of care for additional care plan information)  Current Barriers:  . Chronic Disease Management support, education, and care coordination needs related to HTN, Anxiety, and Alzheimer's  Clinical Goal(s) related to HTN, Anxiety, and Alzheimer's :  Over the next 120 days, patient will:  . Work with the care management team to address educational, disease management, and care coordination needs  . Begin or continue self health monitoring activities as directed today Measure and record blood pressure 1 times per week and adhere to a heart healthy diet . Call provider office for new or worsened signs and symptoms Blood pressure findings outside established parameters and New or worsened symptom related to Anxiety/Alzheimer's and other chronic conditions . Call care management team with questions or concerns . Verbalize basic understanding of patient centered plan of care established today  Interventions related to HTN, Anxiety, and Alzheimer's :  . Evaluation of current treatment plans and patient's adherence to plan as established by provider . Assessed patient understanding of disease states.  Vickie, the patients daughter verbalized understanding of chronic conditions and how well the patient is doing at the new facility . Assessed patient's education and care coordination needs.  The patient has what she needs at the  assisted living. Adjusting well. The daughter knows she can call the [facility] or pcp for questions, needs or concerns. . Provided disease specific education to patient.  Education and support given.  Nash Dimmer with appropriate clinical care team members regarding patient needs  Patient Self Care Activities related to HTN, Anxiety, and Alzheimer's :  . Patient is  unable to independently self-manage chronic health conditions  Initial goal documentation        Patient verbalizes understanding of instructions provided today.   The care management team will reach out to the patient again over the next 60 to 90 days.   Noreene Larsson RN, MSN, [place] Mobile: [phone]

## 2019-08-26 ENCOUNTER — Telehealth: Payer: Self-pay

## 2019-09-02 ENCOUNTER — Other Ambulatory Visit: Payer: Self-pay | Admitting: Family Medicine

## 2019-09-02 DIAGNOSIS — F02818 Dementia in other diseases classified elsewhere, unspecified severity, with other behavioral disturbance: Secondary | ICD-10-CM

## 2019-09-23 ENCOUNTER — Ambulatory Visit: Payer: Self-pay | Admitting: Licensed Clinical Social Worker

## 2019-09-23 ENCOUNTER — Other Ambulatory Visit: Payer: Self-pay | Admitting: Family Medicine

## 2019-09-23 DIAGNOSIS — E034 Atrophy of thyroid (acquired): Secondary | ICD-10-CM

## 2019-09-23 NOTE — Chronic Care Management (AMB) (Addendum)
  Care Management   Follow Up Note   09/23/2019 Name: Megan Carpenter MRN: [id number] DOB: [birth year]  Referred by: Smitty Cords, DO Reason for referral : Care Coordination   Megan Carpenter is a 86 y.o. year old female who is a primary care patient of Smitty Cords, DO. The care management team was consulted for assistance with care management and care coordination needs.    Review of patient status, including review of consultants reports, relevant laboratory and other test results, and collaboration with appropriate care team members and the patient's provider was performed as part of comprehensive patient evaluation and provision of chronic care management services.   LCSW received incoming call from [name] on 09/22/19 Patient has been placed there. Staff is asking if one of patient's medications can be increased as patient is experiencing worsening sun downing in the afternoon. LCSW advised [name] to contact [facility] and updated CCM Pharmacist. LCSW provided suggestions for ongoing sun downing as well. [name] agreeable to contact front desk about this medication concern.   [name], BSW, MSW, LCSW [facility] [place]  [facility] [email or web address] Phone: [phone]

## 2019-11-04 ENCOUNTER — Telehealth: Payer: Self-pay

## 2019-11-04 ENCOUNTER — Ambulatory Visit: Payer: Self-pay | Admitting: General Practice

## 2019-11-04 NOTE — Chronic Care Management (AMB) (Signed)
  Chronic Care Management   Outreach Note  11/04/2019 Name: Megan Carpenter MRN: [id number] DOB: May 08, 1928  Referred by: Smitty Cords, DO Reason for referral : Chronic Care Management (RNCM Chronic Disease Management and Care Coordination Needs)   An unsuccessful telephone outreach was attempted today. The patient was referred to the case management team for assistance with care management and care coordination.   Follow Up Plan: A HIPPA compliant phone message was left for the patient providing contact information and requesting a return call.   Alto Denver RN, MSN, CCM Community Care Coordinator   [facility] [place] Mobile: [phone]

## 2019-11-23 ENCOUNTER — Ambulatory Visit (INDEPENDENT_AMBULATORY_CARE_PROVIDER_SITE_OTHER): Payer: Medicare Other | Admitting: Licensed Clinical Social Worker

## 2019-11-23 DIAGNOSIS — F419 Anxiety disorder, unspecified: Secondary | ICD-10-CM

## 2019-11-23 DIAGNOSIS — F02818 Alzheimer's disease with late onset: Secondary | ICD-10-CM

## 2019-11-23 DIAGNOSIS — I10 Essential (primary) hypertension: Secondary | ICD-10-CM

## 2019-11-23 DIAGNOSIS — E034 Atrophy of thyroid (acquired): Secondary | ICD-10-CM

## 2019-11-23 NOTE — Chronic Care Management (AMB) (Signed)
Chronic Care Management    Clinical Social Work Follow Up Note  11/23/2019 Name: Megan Carpenter MRN: [id number] DOB: 1929-01-17  Megan Carpenter is a 86 y.o. year old female who is a primary care patient of Smitty Cords, DO. The CCM team was consulted for assistance with Level of Care Concerns.   Review of patient status, including review of consultants reports, other relevant assessments, and collaboration with appropriate care team members and the patient's provider was performed as part of comprehensive patient evaluation and provision of chronic care management services.    SDOH (Social Determinants of Health) assessments performed: Yes    Outpatient Encounter Medications as of 11/23/2019  Medication Sig  . levothyroxine (SYNTHROID) 100 MCG tablet TAKE 1 TABLET BY MOUTH BEFORE BREAKFAST DAILY  . melatonin 5 MG TABS TAKE 1 TABLET BY MOUTH DAILY AT BEDTIME  . Nutritional Supplements (NUTRITIONAL SUPPLEMENT PO) Take 1 tablet by mouth daily. Relora herbal supplement  . risperiDONE (RISPERDAL) 0.25 MG tablet TAKE 1 TABLET BY MOUTH WITH LUNCH AND ONE WITH DINNER DAILY  . Valerian Root 450 MG CAPS Take 1 capsule (450 mg total) by mouth daily as needed (anxiety or agitation).   No facility-administered encounter medications on file as of 11/23/2019.     Goals Addressed    . SW: "I need to find LTC placement somewhere else for mom"       Current Barriers:  . Financial constraints related to seeking LTC placement where Medicaid is accepted . Limited social support . Level of care concerns . ADL IADL limitations . Mental Health Concerns  . Limited education about Personal Care Service Resources within [facility] . Limited access to caregiver . Cognitive Deficits . Memory Deficits . Inability to perform ADL's independently . Inability to perform IADL's independently  Clinical Social Work Clinical Goal(s):  Marland Kitchen Over the next 90 days, client will work with SW to address  concerns related to needing more support in the home or pursing LTC placement  Interventions: . Patient interviewed and appropriate assessments performed . Provided patient with information about Medicaid enrollment and requirements. Family has successfully applied for Medicaid and was approved for services last week. However, the facility that patient currently resides at will not accept Medicaid as a form of payment for placement.  . Discussed plans with patient for ongoing care management follow up and provided patient with direct contact information for care management team . Advised patient to contact CCM program or PCP office for any urgent concerns  . Assisted patient/caregiver with obtaining information about health plan benefits . Provided education and assistance to client regarding Advanced Directives. . A voluntary and extensive discussion about advanced care planning including explanation and discussion of advanced was undertaken with the patient's family.  Explanation regarding healthcare proxy and living will was reviewed and packet with forms with explanation of how to fill them out was given.   . Provided education to patient/caregiver regarding level of care options. . Provided education to patient/caregiver about Hospice and/or Palliative Care services . Family confirmed that patient is already on the wait list for C.H.[facility].R.E. program through [facility] but pt still has a 1 year left on the wait list. LCSW sent secure text message to family with [facility] contact information so that she can check and inquire where pt is at on the wait list. . Family confirm that patient has been denied Medicaid in the past due to patient's life insurance policy but was approved for services  in August of 2021.  Marland Kitchen Daughter reports that she will need to find placement for patient at another facility because she has been paying out of pocket for placement at [facility] and [facility] since March of 2021 and  has ran out of funding.  Marland Kitchen LCSW sent text message to daughter with a complete list of available ALF's within [facility]. This list includes each facility and if it accepts state funding or not. important DSS key contact numbers to patient's daughter per her request as well. . Provided emotional support to family as they struggle with caregiver burnout symptoms. Support resource education provided. . Daugher is agreeable to contact [facility] to discuss possible transition to their ALF . Collaboration with PCP regarding patient's need for re placement at another ALF Patient Self Care Activities:  . Currently UNABLE TO independently take appropriate care of self in the home and is in need of more support or LTC placement  Please see past updates related to this goal by clicking on the "Past Updates" button in the selected goal      Follow Up Plan: SW will follow up with patient by phone over the next 30 days  [name], BSW, MSW, LCSW [facility]  [facility] [email or web address] Phone: [phone]

## 2019-11-29 ENCOUNTER — Telehealth: Payer: Self-pay

## 2019-11-29 NOTE — Telephone Encounter (Signed)
Spoke to Vickie due to insurance coverage --patient will be moving to [employer] and they wanted new FL2 forms. Last one was done 03/21 or 07/2019.

## 2019-11-29 NOTE — Telephone Encounter (Signed)
Copied from CRM [id number]. Topic: General - Inquiry >> Nov 29, 2019  9:46 AM Deborha Payment wrote: Reason for CRM: Patient daughter [place]) is requesting a call back from nurse regarding patient is moving to a new assisting living and needing new Fl2 forms.  Call back [phone]

## 2019-11-29 NOTE — Telephone Encounter (Signed)
New FL2 Form was updated / printed from previous one done 06/2019.  Please notify Vickie that FL2 is ready for pick up.  Saralyn Pilar, DO [facility] 11/29/2019, 5:42 PM

## 2019-11-30 NOTE — Telephone Encounter (Signed)
Megan Carpenter notified she will pick up during office hours.

## 2019-12-02 ENCOUNTER — Ambulatory Visit: Payer: Self-pay | Admitting: General Practice

## 2019-12-02 ENCOUNTER — Telehealth: Payer: Self-pay | Admitting: General Practice

## 2019-12-02 DIAGNOSIS — F0281 Dementia in other diseases classified elsewhere with behavioral disturbance: Secondary | ICD-10-CM

## 2019-12-02 DIAGNOSIS — F419 Anxiety disorder, unspecified: Secondary | ICD-10-CM

## 2019-12-02 DIAGNOSIS — G301 Alzheimer's disease with late onset: Secondary | ICD-10-CM | POA: Diagnosis not present

## 2019-12-02 DIAGNOSIS — E034 Atrophy of thyroid (acquired): Secondary | ICD-10-CM | POA: Diagnosis not present

## 2019-12-02 DIAGNOSIS — F02818 Dementia in other diseases classified elsewhere, unspecified severity, with other behavioral disturbance: Secondary | ICD-10-CM

## 2019-12-02 DIAGNOSIS — I10 Essential (primary) hypertension: Secondary | ICD-10-CM

## 2019-12-02 NOTE — Chronic Care Management (AMB) (Signed)
Chronic Care Management   Follow Up Note   12/02/2019 Name: SILENA WYSS MRN: [id number] DOB: 1928-07-20  Referred by: Smitty Cords, DO Reason for referral : Chronic Care Management (RNCM Follow up: Chronic Disease Management and Care Coordination Needs)   DANYLA WATTLEY is a 86 y.o. year old female who is a primary care patient of Smitty Cords, DO. The CCM team was consulted for assistance with chronic disease management and care coordination needs.    Review of patient status, including review of consultants reports, relevant laboratory and other test results, and collaboration with appropriate care team members and the patient's provider was performed as part of comprehensive patient evaluation and provision of chronic care management services.    SDOH (Social Determinants of Health) assessments performed: Yes See Care Plan activities for detailed interventions related to [facility])     Outpatient Encounter Medications as of 12/02/2019  Medication Sig  . levothyroxine (SYNTHROID) 100 MCG tablet TAKE 1 TABLET BY MOUTH BEFORE BREAKFAST DAILY  . melatonin 5 MG TABS TAKE 1 TABLET BY MOUTH DAILY AT BEDTIME  . Nutritional Supplements (NUTRITIONAL SUPPLEMENT PO) Take 1 tablet by mouth daily. Relora herbal supplement  . risperiDONE (RISPERDAL) 0.25 MG tablet TAKE 1 TABLET BY MOUTH WITH LUNCH AND ONE WITH DINNER DAILY  . Valerian Root 450 MG CAPS Take 1 capsule (450 mg total) by mouth daily as needed (anxiety or agitation).   No facility-administered encounter medications on file as of 12/02/2019.     Objective:  BP Readings from Last 3 Encounters:  06/09/19 (!) 140/111  12/15/18 (!) 160/79  11/28/18 (!) 155/87    Goals Addressed              This Visit's Progress   .  RNCM-I need help caring for mom (pt-stated)        Current Barriers:  . Lacks caregiver support.  . Corporate treasurer.  . Cognitive Deficits . Patient is now living at an assisted  living: [facility].  She has been there since the last week of March.  12-02-2019: Update- the patient and family can not afford for the patient to stay at [facility] any longer and the patient will need to move to a different facility. The daughter is working on this now.   Nurse Case Manager Clinical Goal(s):  Marland Kitchen Over the next 120 days, caregiver will work with [facility] to address needs related to finding assistance for patient in the home.   Interventions:  . Evaluation of Living arrangements:  She has gone to  [facility].  The patient is doing well and adjusting. The daughter is happy that she is doing well. At first she kept asking to go home but she is doing much better now. 12-02-2019: Due to the out of pocket cost the patient can not afford to stay at [facility] and [facility]. The daughter is working with [employer] and they will do an evaluation today. The patients daughter is hopeful to find the patient a new place to stay that is affordable.  . Discussed plans with patient for ongoing care management follow up and provided patient with direct contact information for care management team . Provided patient and/or caregiver with emailed information about [facility], and [facility]  ([employer]). Completed . [facility] [phone] . [facility] [phone] . [facility]  [phone] . Empathetic listening listening to daughter as she discussed financial and caregiver  stress. The daughter wants to make sure her mom is somewhere safe. 12-02-2019: The patients daughter is really stressed out about the situation with the patient. [facility] because the patient does not need as much nursing care and they are not prepared to take patients that may wander. She is being evaluated today by other places and the daughter is hopeful this will work out.  . Evaluation: Daughter reports patient is continuing at a  steady weight and has not had any more falls. 12-02-2019: The patient is doing well at [facility]; however they  have run out of funding for the patient to be able to stay there.  . Education and support provided to the daughter. Reminded the daughter to call the insurance company regularly for updates to the paperwork and process. 12-02-2019: The daughter has been working with the LCSW and CCM team. Empathetic listening and support given. The patients daughter is thankful for the support of the CCM team and pcp.    Patient Self Care Activities:  . Currently UNABLE TO independently complete ADLs or IDALs  Please see past updates related to this goal by clicking on the "Past Updates" button in the selected goal      .  RNCM: Pt's daughter- "She has what needs right now" (pt-stated)        CARE PLAN ENTRY (see longtitudinal plan of care for additional care plan information)  Current Barriers:  . Chronic Disease Management support, education, and care coordination needs related to HTN, Anxiety, and Alzheimer's  Clinical Goal(s) related to HTN, Anxiety, and Alzheimer's :  Over the next 120 days, patient will:  . Work with the care management team to address educational, disease management, and care coordination needs  . Begin or continue self health monitoring activities as directed today Measure and record blood pressure 1 times per week and adhere to a heart healthy diet . Call provider office for new or worsened signs and symptoms Blood pressure findings outside established parameters and New or worsened symptom related to Anxiety/Alzheimer's and other chronic conditions . Call care management team with questions or concerns . Verbalize basic understanding of patient centered plan of care established today  Interventions related to HTN, Anxiety, and Alzheimer's :  . Evaluation of current treatment plans and patient's adherence to plan as established by provider . Assessed patient  understanding of disease states.  Vickie, the patients daughter verbalized understanding of chronic conditions and how well the patient is doing at the new facility. 12-02-2019: The patients chronic health conditions are stable. The patient will be moving when a new facility is found. The patient does not need a lot of nursing care and this is causing it to be harder to find suitable facility for her to go.  . Assessed patient's education and care coordination needs.  The patient has what she needs at the assisted living. Adjusting well. The daughter knows she can call the [facility] or pcp for questions, needs or concerns. 12-02-2019: The patient is stable at this time.  . Provided disease specific education to patient.  Education and support given.  Steele Sizer with appropriate clinical care team members regarding patient needs.  The daughter is working with the LCSW and CCM team to help with meeting the needs of the patient.   Patient Self Care Activities related to HTN, Anxiety, and Alzheimer's :  . Patient is unable to independently self-manage chronic health conditions  Please see past updates related to this  goal by clicking on the "Past Updates" button in the selected goal          Plan:   Telephone follow up appointment with care management team member scheduled for: 01-06-2020 at 0900   Alto Denver RN, MSN, CCM Community Care Coordinator [facility]  [facility] [place] Mobile: [phone]

## 2019-12-02 NOTE — Patient Instructions (Signed)
Visit Information  Goals Addressed              This Visit's Progress     RNCM-I need help caring for mom (pt-stated)        Current Barriers:   Lacks caregiver support.   Corporate treasurer.   Cognitive Deficits  Patient is now living at an assisted living: [facility].  She has been there since the last week of March.  12-02-2019: Update- the patient and family can not afford for the patient to stay at [facility] any longer and the patient will need to move to a different facility. The daughter is working on this now.   Nurse Case Manager Clinical Goal(s):   Over the next 120 days, caregiver will work with [facility] to address needs related to finding assistance for patient in the home.   Interventions:   Evaluation of Living arrangements:  She has gone to  [facility].  The patient is doing well and adjusting. The daughter is happy that she is doing well. At first she kept asking to go home but she is doing much better now. 12-02-2019: Due to the out of pocket cost the patient can not afford to stay at [facility]. The daughter is working with Film[employer] editor and they will do an evaluation today. The patients daughter is hopeful to find the patient a new place to stay that is affordable.   Discussed plans with patient for ongoing care management follow up and provided patient with direct contact information for care management team  Provided patient and/or caregiver with emailed information about [facility], and [facility]  ([employer]). Completed  [facility] [phone]  [facility] [phone]  [facility]  [phone]  Empathetic listening listening to daughter as she discussed financial and caregiver stress. The daughter wants to make sure her mom is somewhere safe. 12-02-2019: The patients daughter is really stressed out about the situation with the patient. [facility] because the patient does not need as much nursing care and they are not prepared to take patients that may wander. She is being evaluated today by other places and the daughter is hopeful this will work out.   Evaluation: Daughter reports patient is continuing at a steady weight and has not had any more falls. 12-02-2019: The patient is doing well at [facility]; however they  have run out of funding for the patient to be able to stay there.   Education and support provided to the daughter. Reminded the daughter to call the insurance company regularly for updates to the paperwork and process. 12-02-2019: The daughter has been working with the LCSW and CCM team. Empathetic listening and support given. The patients daughter is thankful for the support of the CCM team and pcp.    Patient Self Care Activities:   Currently UNABLE TO independently complete ADLs or IDALs  Please see past updates related to this goal by clicking on the "Past Updates" button in the selected goal        RNCM: Pt's daughter- "She has what needs right now" (pt-stated)        CARE PLAN ENTRY (see longtitudinal plan of care for additional care plan information)  Current Barriers:   Chronic Disease Management support, education, and care coordination needs related to HTN, Anxiety, and Alzheimer's  Clinical Goal(s) related to HTN, Anxiety, and Alzheimer's :  Over the next  120 days, patient will:   Work with the care management team to address educational, disease management, and care coordination needs   Begin or continue self health monitoring activities as directed today Measure and record blood pressure 1 times per week and adhere to a heart healthy diet  Call provider office for new or worsened signs and symptoms Blood pressure findings outside established parameters and New or worsened symptom related to Anxiety/Alzheimer's and other chronic conditions  Call care management team with questions or  concerns  Verbalize basic understanding of patient centered plan of care established today  Interventions related to HTN, Anxiety, and Alzheimer's :   Evaluation of current treatment plans and patient's adherence to plan as established by provider  Assessed patient understanding of disease states.  Vickie, the patients daughter verbalized understanding of chronic conditions and how well the patient is doing at the new facility. 12-02-2019: The patients chronic health conditions are stable. The patient will be moving when a new facility is found. The patient does not need a lot of nursing care and this is causing it to be harder to find suitable facility for her to go.   Assessed patient's education and care coordination needs.  The patient has what she needs at the assisted living. Adjusting well. The daughter knows she can call the [facility] or pcp for questions, needs or concerns. 12-02-2019: The patient is stable at this time.   Provided disease specific education to patient.  Education and support given.   Collaborated with appropriate clinical care team members regarding patient needs.  The daughter is working with the LCSW and CCM team to help with meeting the needs of the patient.   Patient Self Care Activities related to HTN, Anxiety, and Alzheimer's :   Patient is unable to independently self-manage chronic health conditions  Please see past updates related to this goal by clicking on the "Past Updates" button in the selected goal         Patient verbalizes understanding of instructions provided today.   Telephone follow up appointment with care management team member scheduled for: 01-06-2020 at 0900  Alto Denver RN, MSN, CCM Community Care Coordinator [facility]   [facility] [place] Mobile: [phone]

## 2019-12-21 ENCOUNTER — Telehealth: Payer: Self-pay

## 2019-12-21 ENCOUNTER — Telehealth: Payer: Self-pay | Admitting: Licensed Clinical Social Worker

## 2019-12-21 NOTE — Telephone Encounter (Signed)
°  Chronic Care Management    Clinical Social Work General Follow Up Note  12/21/2019 Name: Megan Carpenter MRN: [id number] DOB: May 12, 1928  Megan Carpenter is a 86 y.o. year old female who is a primary care patient of Smitty Cords, DO. The CCM team was consulted for assistance with Level of Care Concerns.   Review of patient status, including review of consultants reports, relevant laboratory and other test results, and collaboration with appropriate care team members and the patient's provider was performed as part of comprehensive patient evaluation and provision of chronic care management services.    LCSW completed CCM outreach attempt today but was unable to reach patient successfully. A HIPPA compliant voice message was left encouraging patient to return call once available. LCSW will have Scheduling Care Guide reschedule CCM SW appointment as well.   Outpatient Encounter Medications as of 12/21/2019  Medication Sig   levothyroxine (SYNTHROID) 100 MCG tablet TAKE 1 TABLET BY MOUTH BEFORE BREAKFAST DAILY   melatonin 5 MG TABS TAKE 1 TABLET BY MOUTH DAILY AT BEDTIME   Nutritional Supplements (NUTRITIONAL SUPPLEMENT PO) Take 1 tablet by mouth daily. Relora herbal supplement   risperiDONE (RISPERDAL) 0.25 MG tablet TAKE 1 TABLET BY MOUTH WITH LUNCH AND ONE WITH DINNER DAILY   Valerian Root 450 MG CAPS Take 1 capsule (450 mg total) by mouth daily as needed (anxiety or agitation).   No facility-administered encounter medications on file as of 12/21/2019.    Follow Up Plan: Scheduling Care Guide will reach out to patient to reschedule appointment.   [name], BSW, MSW, LCSW [facility]   [facility] [email or web address] Phone: [phone]

## 2019-12-24 ENCOUNTER — Other Ambulatory Visit: Payer: Self-pay | Admitting: Family Medicine

## 2019-12-24 DIAGNOSIS — E034 Atrophy of thyroid (acquired): Secondary | ICD-10-CM

## 2019-12-29 NOTE — Telephone Encounter (Signed)
Pt has been r/s for 01/11/2020 

## 2020-01-06 ENCOUNTER — Telehealth: Payer: Self-pay

## 2020-01-06 ENCOUNTER — Telehealth: Payer: Self-pay | Admitting: General Practice

## 2020-01-06 NOTE — Telephone Encounter (Signed)
  Chronic Care Management   Outreach Note  01/06/2020 Name: Megan Carpenter MRN: [id number] DOB: [birth year]  Referred by: Smitty Cords, DO Reason for referral : Chronic Care Management (RNCM Follow up call for Chronic Disease Management and Care Coordination Needs)   An unsuccessful telephone outreach was attempted today. The patient was referred to the case management team for assistance with care management and care coordination.   Follow Up Plan: A HIPAA compliant phone message was left for the patient providing contact information and requesting a return call.   Alto Denver RN, MSN, CCM Community Care Coordinator [place]  [facility] [place] Mobile: [phone]

## 2020-01-11 ENCOUNTER — Telehealth: Payer: Self-pay | Admitting: Licensed Clinical Social Worker

## 2020-01-11 ENCOUNTER — Telehealth: Payer: Medicare Other

## 2020-01-11 NOTE — Telephone Encounter (Signed)
  Chronic Care Management    Clinical Social Work General Follow Up Note  01/11/2020 Name: Megan Carpenter MRN: [id number] DOB: 1928/12/15  Megan Carpenter is a 86 y.o. year old female who is a primary care patient of Smitty Cords, DO. The CCM team was consulted for assistance with Level of Care Concerns.   Review of patient status, including review of consultants reports, relevant laboratory and other test results, and collaboration with appropriate care team members and the patient's provider was performed as part of comprehensive patient evaluation and provision of chronic care management services.    LCSW completed second CCM outreach attempt today but was unable to reach patient successfully. A HIPPA compliant voice message was left encouraging patient to return call once available. LCSW will reschedule CCM SW appointment as well.  Outpatient Encounter Medications as of 01/11/2020  Medication Sig  . levothyroxine (SYNTHROID) 100 MCG tablet TAKE 1 TABLET BY MOUTH BEFORE BREAKFAST DAILY  . melatonin 5 MG TABS TAKE 1 TABLET BY MOUTH DAILY AT BEDTIME  . Nutritional Supplements (NUTRITIONAL SUPPLEMENT PO) Take 1 tablet by mouth daily. Relora herbal supplement  . risperiDONE (RISPERDAL) 0.25 MG tablet TAKE 1 TABLET BY MOUTH WITH LUNCH AND ONE WITH DINNER DAILY  . Valerian Root 450 MG CAPS Take 1 capsule (450 mg total) by mouth daily as needed (anxiety or agitation).   No facility-administered encounter medications on file as of 01/11/2020.    Follow Up Plan: SW will follow up with patient by phone over the next 60 days to make third outreach attempt   [name], BSW, MSW, LCSW [facility]  [facility] [email or web address] Phone: [phone]

## 2020-02-03 ENCOUNTER — Ambulatory Visit (INDEPENDENT_AMBULATORY_CARE_PROVIDER_SITE_OTHER): Payer: Medicare Other | Admitting: General Practice

## 2020-02-03 ENCOUNTER — Telehealth: Payer: Self-pay | Admitting: General Practice

## 2020-02-03 DIAGNOSIS — F419 Anxiety disorder, unspecified: Secondary | ICD-10-CM

## 2020-02-03 DIAGNOSIS — I10 Essential (primary) hypertension: Secondary | ICD-10-CM

## 2020-02-03 DIAGNOSIS — F0281 Dementia in other diseases classified elsewhere with behavioral disturbance: Secondary | ICD-10-CM

## 2020-02-03 DIAGNOSIS — F02818 Dementia in other diseases classified elsewhere, unspecified severity, with other behavioral disturbance: Secondary | ICD-10-CM

## 2020-02-03 NOTE — Patient Instructions (Signed)
Visit Information  Goals Addressed              This Visit's Progress     RNCM-I need help caring for mom (pt-stated)        Current Barriers:   Lacks caregiver support.   Corporate treasurer.   Cognitive Deficits  Patient is now living at an assisted living: [facility].  She has been there since the last week of March.  12-02-2019: Update- the patient and family can not afford for the patient to stay at [facility] any longer and the patient will need to move to a different facility. The daughter is working on this now. 02-03-2020: [facility] is closing and the daughter is working on finding a new facility. [facility] will close on 02-14-2020.   Nurse Case Manager Clinical Goal(s):   Over the next 120 days, caregiver will work with [facility] to address needs related to finding assistance for patient in the home.   Interventions:   Evaluation of Living arrangements:  She has gone to  [employer] and [facility].  The patient is doing well and adjusting. The daughter is happy that she is doing well. At first she kept asking to go home but she is doing much better now. 12-02-2019: Due to the out of pocket cost the patient can not afford to stay at [facility] and [facility]. The daughter is working with [employer] and they will do an evaluation today. The patients daughter is hopeful to find the patient a new place to stay that is affordable. 02-03-2020: [facility] is closing and the daughter is hopeful to get the patient into a home in [place]. She will know more tomorrow after the caregiver goes to evaluate the patient tonight.   Discussed plans with patient for ongoing care management follow up and provided patient with direct contact information for care management team  Provided patient and/or caregiver with emailed information about [facility], and [facility]  ([employer]). Completed  [facility]  [phone]  [facility] [phone]  [facility]  [phone]  Empathetic listening listening to daughter as she discussed financial and caregiver stress. The daughter wants to make sure her mom is somewhere safe. 02-03-2020: The patients daughter is really stressed out about the situation with the patient. [facility] because the patient does not need as much nursing care and they are not prepared to take patients that may wander. She is being evaluated today by other places and the daughter is hopeful this will work out.   Evaluation: Daughter reports patient is continuing at a steady weight and has not had any more falls. 12-02-2019: The patient is doing well at [facility]; however they  have run out of funding for the patient to be able to stay there. 02-03-2020: Is currently working on finding new place for the patient to live. The daughter will keep the RNCM updated.   Education and support provided to the daughter. Reminded the daughter to call the insurance company regularly for updates to the paperwork and process. 12-02-2019: The daughter has been working with the LCSW and CCM team. Empathetic listening and support given. The patients daughter is thankful for the support of the CCM team and pcp.    Patient Self Care Activities:   Currently UNABLE TO independently complete ADLs or IDALs  Please see past updates related to this goal by clicking on the "  Past Updates" button in the selected goal        RNCM: Pt's daughter- "She has what needs right now" (pt-stated)        CARE PLAN ENTRY (see longtitudinal plan of care for additional care plan information)  Current Barriers:   Chronic Disease Management support, education, and care coordination needs related to HTN, Anxiety, and Alzheimer's  Clinical Goal(s) related to HTN, Anxiety, and Alzheimer's :  Over the next 120 days, patient will:   Work with the care management team to address  educational, disease management, and care coordination needs   Begin or continue self health monitoring activities as directed today Measure and record blood pressure 1 times per week and adhere to a heart healthy diet  Call provider office for new or worsened signs and symptoms Blood pressure findings outside established parameters and New or worsened symptom related to Anxiety/Alzheimer's and other chronic conditions  Call care management team with questions or concerns  Verbalize basic understanding of patient centered plan of care established today  Interventions related to HTN, Anxiety, and Alzheimer's :   Evaluation of current treatment plans and patient's adherence to plan as established by provider  Assessed patient understanding of disease states.  Vickie, the patients daughter verbalized understanding of chronic conditions and how well the patient is doing at the new facility. 12-02-2019: The patients chronic health conditions are stable. The patient will be moving when a new facility is found. The patient does not need a lot of nursing care and this is causing it to be harder to find suitable facility for her to go. 02-03-2020: The patient is going to have to move. [facility] is closing and the daughter is currently working with a new place in [place] to get the patient moved there. The daughter will know more tomorrow after the caregiver of the home goes and evaluates the patient today.   Assessed patient's education and care coordination needs.  The patient has what she needs at the assisted living. Adjusting well. The daughter knows she can call the [facility] or pcp for questions, needs or concerns. 02-03-2020: The patient is stable at this time. She will be 89 on Monday.   Provided disease specific education to patient.  Education and support given.   Collaborated with appropriate clinical care team members regarding patient needs.  The daughter is working with the LCSW and  CCM team to help with meeting the needs of the patient.   Patient Self Care Activities related to HTN, Anxiety, and Alzheimer's :   Patient is unable to independently self-manage chronic health conditions  Please see past updates related to this goal by clicking on the "Past Updates" button in the selected goal         Print copy of patient instructions provided.   Telephone follow up appointment with care management team member scheduled for:03-16-2020 at 1:45 pm  Alto Denver RN, MSN, CCM Community Care Coordinator [facility]   [facility] [place] Mobile: [phone]

## 2020-02-03 NOTE — Chronic Care Management (AMB) (Signed)
Chronic Care Management   Follow Up Note   02/03/2020 Name: STESHA NEYENS MRN: [id number] DOB: 24-Feb-1929  Referred by: Smitty Cords, DO Reason for referral : Chronic Care Management (RNCM: Follow up for Chronic Disease Management and Care Coordination Needs)   HENLI HEY is a 86 y.o. year old female who is a primary care patient of Smitty Cords, DO. The CCM team was consulted for assistance with chronic disease management and care coordination needs.    Review of patient status, including review of consultants reports, relevant laboratory and other test results, and collaboration with appropriate care team members and the patient's provider was performed as part of comprehensive patient evaluation and provision of chronic care management services.    SDOH (Social Determinants of Health) assessments performed: Yes See Care Plan activities for detailed interventions related to [facility])     Outpatient Encounter Medications as of 02/03/2020  Medication Sig  . levothyroxine (SYNTHROID) 100 MCG tablet TAKE 1 TABLET BY MOUTH BEFORE BREAKFAST DAILY  . melatonin 5 MG TABS TAKE 1 TABLET BY MOUTH DAILY AT BEDTIME  . Nutritional Supplements (NUTRITIONAL SUPPLEMENT PO) Take 1 tablet by mouth daily. Relora herbal supplement  . risperiDONE (RISPERDAL) 0.25 MG tablet TAKE 1 TABLET BY MOUTH WITH LUNCH AND ONE WITH DINNER DAILY  . Valerian Root 450 MG CAPS Take 1 capsule (450 mg total) by mouth daily as needed (anxiety or agitation).   No facility-administered encounter medications on file as of 02/03/2020.     Objective:   Goals Addressed              This Visit's Progress   .  RNCM-I need help caring for mom (pt-stated)        Current Barriers:  . Lacks caregiver support.  . Corporate treasurer.  . Cognitive Deficits . Patient is now living at an assisted living: [facility].  She has been there since the last week of March.  12-02-2019: Update- the  patient and family can not afford for the patient to stay at [facility] any longer and the patient will need to move to a different facility. The daughter is working on this now. 02-03-2020: [facility] is closing and the daughter is working on finding a new facility. [facility] will close on 02-14-2020.   Nurse Case Manager Clinical Goal(s):  Marland Kitchen Over the next 120 days, caregiver will work with [facility] to address needs related to finding assistance for patient in the home.   Interventions:  . Evaluation of Living arrangements:  She has gone to  [employer] [facility].  The patient is doing well and adjusting. The daughter is happy that she is doing well. At first she kept asking to go home but she is doing much better now. 12-02-2019: Due to the out of pocket cost the patient can not afford to stay at [facility] and [facility]. The daughter is working with [employer] and they will do an evaluation today. The patients daughter is hopeful to find the patient a new place to stay that is affordable. 02-03-2020: [facility] is closing and the daughter is hopeful to get the patient into a home in [place]. She will know more tomorrow after the caregiver goes to evaluate the patient tonight.  . Discussed plans with patient for ongoing care management follow up and provided patient with direct contact information for care management team . Provided patient and/or caregiver with emailed  information about [facility], and [facility]  (community resource). Completed . [facility] [phone] . [facility] [phone] . [facility]  [phone] . Empathetic listening listening to daughter as she discussed financial and caregiver stress. The daughter wants to make sure her mom is somewhere safe. 02-03-2020: The patients daughter is really stressed out about the situation with the patient. [facility] because the patient does  not need as much nursing care and they are not prepared to take patients that may wander. She is being evaluated today by other places and the daughter is hopeful this will work out.  . Evaluation: Daughter reports patient is continuing at a steady weight and has not had any more falls. 12-02-2019: The patient is doing well at [facility]; however they  have run out of funding for the patient to be able to stay there. 02-03-2020: Is currently working on finding new place for the patient to live. The daughter will keep the RNCM updated.  . Education and support provided to the daughter. Reminded the daughter to call the insurance company regularly for updates to the paperwork and process. 12-02-2019: The daughter has been working with the LCSW and CCM team. Empathetic listening and support given. The patients daughter is thankful for the support of the CCM team and pcp.    Patient Self Care Activities:  . Currently UNABLE TO independently complete ADLs or IDALs  Please see past updates related to this goal by clicking on the "Past Updates" button in the selected goal      .  RNCM: Pt's daughter- "She has what needs right now" (pt-stated)        CARE PLAN ENTRY (see longtitudinal plan of care for additional care plan information)  Current Barriers:  . Chronic Disease Management support, education, and care coordination needs related to HTN, Anxiety, and Alzheimer's  Clinical Goal(s) related to HTN, Anxiety, and Alzheimer's :  Over the next 120 days, patient will:  . Work with the care management team to address educational, disease management, and care coordination needs  . Begin or continue self health monitoring activities as directed today Measure and record blood pressure 1 times per week and adhere to a heart healthy diet . Call provider office for new or worsened signs and symptoms Blood pressure findings outside established parameters and New or worsened symptom related to  Anxiety/Alzheimer's and other chronic conditions . Call care management team with questions or concerns . Verbalize basic understanding of patient centered plan of care established today  Interventions related to HTN, Anxiety, and Alzheimer's :  . Evaluation of current treatment plans and patient's adherence to plan as established by provider . Assessed patient understanding of disease states.  Vickie, the patients daughter verbalized understanding of chronic conditions and how well the patient is doing at the new facility. 12-02-2019: The patients chronic health conditions are stable. The patient will be moving when a new facility is found. The patient does not need a lot of nursing care and this is causing it to be harder to find suitable facility for her to go. 02-03-2020: The patient is going to have to move. [facility] is closing and the daughter is currently working with a new place in [place] to get the patient moved there. The daughter will know more tomorrow after the caregiver of the home goes and evaluates the patient today.  . Assessed patient's education and care coordination needs.  The patient  has what she needs at the assisted living. Adjusting well. The daughter knows she can call the [facility] or pcp for questions, needs or concerns. 02-03-2020: The patient is stable at this time. She will be 89 on Monday.  . Provided disease specific education to patient.  Education and support given.  Steele Sizer with appropriate clinical care team members regarding patient needs.  The daughter is working with the LCSW and CCM team to help with meeting the needs of the patient.   Patient Self Care Activities related to HTN, Anxiety, and Alzheimer's :  . Patient is unable to independently self-manage chronic health conditions  Please see past updates related to this goal by clicking on the "Past Updates" button in the selected goal          Plan:   Telephone follow up appointment with  care management team member scheduled for: 03-16-2020 at 1:45 pm   Alto Denver RN, MSN, CCM Community Care Coordinator [facility]  [facility] [place] Mobile: [phone]

## 2020-02-04 ENCOUNTER — Ambulatory Visit: Payer: Self-pay | Admitting: *Deleted

## 2020-02-04 DIAGNOSIS — G301 Alzheimer's disease with late onset: Secondary | ICD-10-CM

## 2020-02-04 DIAGNOSIS — I10 Essential (primary) hypertension: Secondary | ICD-10-CM

## 2020-02-04 DIAGNOSIS — F0281 Dementia in other diseases classified elsewhere with behavioral disturbance: Secondary | ICD-10-CM

## 2020-02-04 NOTE — Chronic Care Management (AMB) (Signed)
  Chronic Care Management    Clinical Social Work Follow Up Note  02/04/2020 Name: Megan Carpenter MRN: [id number] DOB: 1928/08/05  Megan Carpenter is a 86 y.o. year old female who is a primary care patient of Smitty Cords, DO. The CCM team was consulted for assistance with Level of Care Concerns.   Review of patient status, including review of consultants reports, other relevant assessments, and collaboration with appropriate care team members and the patient's provider was performed as part of comprehensive patient evaluation and provision of chronic care management services.    SDOH (Social Determinants of Health) assessments performed: No    Outpatient Encounter Medications as of 02/04/2020  Medication Sig  . levothyroxine (SYNTHROID) 100 MCG tablet TAKE 1 TABLET BY MOUTH BEFORE BREAKFAST DAILY  . melatonin 5 MG TABS TAKE 1 TABLET BY MOUTH DAILY AT BEDTIME  . Nutritional Supplements (NUTRITIONAL SUPPLEMENT PO) Take 1 tablet by mouth daily. Relora herbal supplement  . risperiDONE (RISPERDAL) 0.25 MG tablet TAKE 1 TABLET BY MOUTH WITH LUNCH AND ONE WITH DINNER DAILY  . Valerian Root 450 MG CAPS Take 1 capsule (450 mg total) by mouth daily as needed (anxiety or agitation).   No facility-administered encounter medications on file as of 02/04/2020.     Goals Addressed            This Visit's Progress   . SW: "I need to find LTC placement somewhere else for mom"       Current Barriers:  . Financial constraints related to seeking LTC placement where Medicaid is accepted . Limited social support . Level of care concerns . ADL IADL limitations . Mental Health Concerns  . Limited education about Personal Care Service Resources within [facility]* . Limited access to caregiver . Cognitive Deficits . Memory Deficits . Inability to perform ADL's independently . Inability to perform IADL's independently  Clinical Social Work Clinical Goal(s):  Marland Kitchen Over the next 90 days,  client will work with SW to address concerns related to needing more support in the home or pursing LTC placement  Interventions: . Phone call to patient's daughter regarding status of placement in an alternative facility due to closing of current facility . Per patient's daughter, the facility in [place] declined patient due to potential to wander. Patient to be re-assessed by [facility] today for possible placement . Patient's daughter has a family member working on another facility as well as a back up confirming that patient will need to be moved by 02/14/20. Marland Kitchen Emotional support provided to patient's daughter as she struggles to find facility care for patient.  Patient Self Care Activities:  . Currently UNABLE TO independently take appropriate care of self in the home and is in need of more support or LTC placement  Please see past updates related to this goal by clicking on the "Past Updates" button in the selected goal          Follow Up Plan: SW will follow up with patient by phone over the next 5-7 business days   [employer], LCSW [facility] [phone]

## 2020-02-04 NOTE — Patient Instructions (Signed)
Thank you allowing the Chronic Care Management Team to be a part of your care! It was a pleasure speaking with you today!  1. Please call your CCM team with questions or concerns regarding patient's placement needs  CCM (Chronic Care Management) Team   Vergia Alcon RN, BSN Nurse Care Coordinator  [phone]  Aceson Labell [address] LCSW Clinical Social Worker [phone]  Goals Addressed            This Visit's Progress   . SW: "I need to find LTC placement somewhere else for mom"       Current Barriers:  . Financial constraints related to seeking LTC placement where Medicaid is accepted . Limited social support . Level of care concerns . ADL IADL limitations . Mental Health Concerns  . Limited education about Personal Care Service Resources within [facility]* . Limited access to caregiver . Cognitive Deficits . Memory Deficits . Inability to perform ADL's independently . Inability to perform IADL's independently  Clinical Social Work Clinical Goal(s):  Marland Kitchen Over the next 90 days, client will work with SW to address concerns related to needing more support in the home or pursing LTC placement  Interventions: . Phone call to patient's daughter regarding status of placement in an alternative facility due to closing of current facility . Per patient's daughter, the facility in [place] declined patient due to potential to wander. Patient to be re-assessed by [facility] today for possible placement . Patient's daughter has a family member working on another facility as well as a back up confirming that patient will need to be moved by 02/14/20. Marland Kitchen Emotional support provided to patient's daughter as she struggles to find facility care for patient.  Patient Self Care Activities:  . Currently UNABLE TO independently take appropriate care of self in the home and is in need of more support or LTC placement  Please see past updates related to this goal by clicking on the  "Past Updates" button in the selected goal          The patient verbalized understanding of instructions provided today and declined a print copy of patient instruction materials.   The care management team will reach out to the patient again over the next 5-7 business days days.

## 2020-02-09 ENCOUNTER — Telehealth: Payer: Medicare Other | Admitting: Family Medicine

## 2020-02-09 ENCOUNTER — Other Ambulatory Visit: Payer: Self-pay

## 2020-02-09 ENCOUNTER — Ambulatory Visit: Payer: Self-pay | Admitting: Licensed Clinical Social Worker

## 2020-02-09 ENCOUNTER — Telehealth (INDEPENDENT_AMBULATORY_CARE_PROVIDER_SITE_OTHER): Payer: Medicare Other | Admitting: Family Medicine

## 2020-02-09 ENCOUNTER — Telehealth: Payer: Self-pay

## 2020-02-09 ENCOUNTER — Encounter: Payer: Self-pay | Admitting: Family Medicine

## 2020-02-09 DIAGNOSIS — E038 Other specified hypothyroidism: Secondary | ICD-10-CM | POA: Diagnosis not present

## 2020-02-09 DIAGNOSIS — F0281 Dementia in other diseases classified elsewhere with behavioral disturbance: Secondary | ICD-10-CM

## 2020-02-09 DIAGNOSIS — E43 Unspecified severe protein-calorie malnutrition: Secondary | ICD-10-CM | POA: Diagnosis not present

## 2020-02-09 DIAGNOSIS — K219 Gastro-esophageal reflux disease without esophagitis: Secondary | ICD-10-CM

## 2020-02-09 DIAGNOSIS — F02818 Dementia in other diseases classified elsewhere, unspecified severity, with other behavioral disturbance: Secondary | ICD-10-CM

## 2020-02-09 DIAGNOSIS — I10 Essential (primary) hypertension: Secondary | ICD-10-CM

## 2020-02-09 DIAGNOSIS — G301 Alzheimer's disease with late onset: Secondary | ICD-10-CM

## 2020-02-09 DIAGNOSIS — E063 Autoimmune thyroiditis: Secondary | ICD-10-CM | POA: Diagnosis not present

## 2020-02-09 NOTE — Chronic Care Management (AMB) (Signed)
Chronic Care Management    Clinical Social Work Follow Up Note  02/09/2020 Name: Megan Carpenter MRN: [id number] DOB: 07/29/1928  Megan Carpenter is a 86 y.o. year old female who is a primary care patient of Smitty Cords, DO. The CCM team was consulted for assistance with Level of Care Concerns.   Review of patient status, including review of consultants reports, other relevant assessments, and collaboration with appropriate care team members and the patient's provider was performed as part of comprehensive patient evaluation and provision of chronic care management services.    SDOH (Social Determinants of Health) assessments performed: Yes    Outpatient Encounter Medications as of 02/09/2020  Medication Sig  . levothyroxine (SYNTHROID) 100 MCG tablet TAKE 1 TABLET BY MOUTH BEFORE BREAKFAST DAILY  . melatonin 5 MG TABS TAKE 1 TABLET BY MOUTH DAILY AT BEDTIME  . Nutritional Supplements (NUTRITIONAL SUPPLEMENT PO) Take 1 tablet by mouth daily. Relora herbal supplement  . risperiDONE (RISPERDAL) 0.25 MG tablet TAKE 1 TABLET BY MOUTH WITH LUNCH AND ONE WITH DINNER DAILY  . Valerian Root 450 MG CAPS Take 1 capsule (450 mg total) by mouth daily as needed (anxiety or agitation).   No facility-administered encounter medications on file as of 02/09/2020.     Goals Addressed    . SW: "I need to find LTC placement somewhere else for mom"       Current Barriers:  . Financial constraints related to seeking LTC placement where Medicaid is accepted . Limited social support . Level of care concerns . ADL IADL limitations . Mental Health Concerns  . Limited education about Personal Care Service Resources within [facility]* . Limited access to caregiver . Cognitive Deficits . Memory Deficits . Inability to perform ADL's independently . Inability to perform IADL's independently  Clinical Social Work Clinical Goal(s):  Marland Kitchen Over the next 90 days, client will work with SW to address  concerns related to needing more support in the home or pursing LTC placement  Interventions: . Phone call to patient's daughter regarding status of placement in an alternative facility due to closing of current facility. LCSW completed joint virtual telephone visit with patient's caregiver and PCP on 02/09/20. Family is wanting FL2 completed in order to secure long term placement. Patient completed PPD skin test at pharmacy this morning and will go back on Friday to have her results results. FL2 was completed by PCP and family will pick this document up form front desk. Patient will successfully placed at [facility] for [facility] Care on 02/11/20. Family will pay out pocket for placement.  . Patient will have a roommate at [facility]. Family is hopeful that patient will receive better care and socialization there than [facility]. Patient will be able to go outside with supervisor there which family is exited about.  . Patient celebrated her 37th birthday with cupcakes and family. Patient has been updated on her transition to [facility] and caregiver reports that she is happy with this plan.  . Patient interviewed and appropriate assessments performed . Provided patient with information about Medicaid enrollment and requirements. Family has successfully applied for Medicaid and was approved for services last week. However, the facility that patient currently resides at will not accept Medicaid as a form of payment for placement.  . Discussed plans with patient for ongoing care management follow up and provided patient with direct contact information for care management team . Advised patient to contact CCM program  or PCP office for any urgent concerns  . Assisted patient/caregiver with obtaining information about health plan benefits . Provided education and assistance to client regarding Advanced Directives. . A voluntary and extensive discussion about advanced care planning  including explanation and discussion of advanced was undertaken with the patient's family.  Explanation regarding healthcare proxy and living will was reviewed and packet with forms with explanation of how to fill them out was given.   . Provided education to patient/caregiver regarding level of care options. . Provided education to patient/caregiver about Hospice and/or Palliative Care services . Family confirmed that patient is already on the wait list for [facility] program through [facility] but pt still has a 1 year left on the wait list. LCSW sent secure text message to family with [facility] contact information so that she can check and inquire where pt is at on the wait list. However, patient will not need this service once she is placed long term.  . Family confirm that patient has been denied Medicaid in the past due to patient's life insurance policy but was approved for services in August of 2021. Family wish to discontinue Medicaid as they are fearful that they will go after patient's assets. DSS informed family that they can not discontinue patient's Medicaid until she sells her house on 03/03/20 and has too many assets to qualify for their program. Family will pay privately for [facility].  Marland Kitchen LCSW sent text message to daughter with a complete list of available ALF's within [facility] in the past. Daugher will revert back to this list as needed. This list includes each facility, if it accepts state funding or not and important DSS key contact numbers.  . Provided emotional support to family as they struggle with caregiver burnout symptoms and struggles to find facility care for patient. Support resource education provided.  Patient Self Care Activities:  . Currently UNABLE TO independently take appropriate care of self in the home and is in need of more support or LTC placement  Please see past updates related to this goal by clicking on the "Past Updates" button in the  selected goal       Follow Up Plan: SW will follow up with patient by phone over the next quarter  [name], [place], MSW, LCSW [facility]  [email or web address] Phone: [phone]

## 2020-02-09 NOTE — Progress Notes (Signed)
Virtual Visit via Telephone The purpose of this virtual visit is to provide medical care while limiting exposure to the novel coronavirus (COVID19) for both patient and office staff.  Consent was obtained for phone visit:  Yes.   Answered questions that patient had about telehealth interaction:  Yes.   I discussed the limitations, risks, security and privacy concerns of performing an evaluation and management service by telephone. I also discussed with the patient that there may be a patient responsible charge related to this service. The patient expressed understanding and agreed to proceed.  Patient Location: Home Provider Location: [facility])  ---------------------------------------------------------------------- Chief Complaint  Patient presents with  . FL2 paperwork  . Dementia    S: Reviewed CMA documentation. I have called patient and gathered additional HPI as follows:  Daughter Vickie provided history. Patient has dementiaand limited verbal input.   DEMENTIA, with behavior disturbances SevereProtein Calorie Malnutritionwith Weight Loss Last visiton 05/2019,reviewedsame problemwithChronic dementia, Alzheimer's type,progressive decline, primarily managed by daughter as caregiver, see prior notes for background information. - Updates - still gradual worsening decline in cognitive status and behavioral issues, some agitation, wandering. Denies violent or aggressive behavior. - She had previously been at [facility]. However they are closing and she had now needed to be relocated to other ALF. Vickie has worked with our CCM Social Work / Case management team and placement is now proceeding with [facility]. - Today they will need an updated FL2 Form. Last updated 11/2019. - Since last visit has started Risperidone 0.25mg  nightly with good results, limiting behavioral disturbances and allow patient to rest at night. Asking if  can take it twice a day to help with daytime behavior, especially with going to Care Home - Still with worsening sun-downing with her dementia. - Patient needs assistance with most ADL  Help bathing and shower, she needs assistance with meal prep, but can eat but still Often requires assistance with feeding, and needs assistance with clothing but can dress - Very severe caregiver stress and work - Regarding communication - her verbal communication is limited, reduced words and phrases, she often has difficulty comprehending tasks or requests. - Denies significant mood changes, depression, no harm to others, dysuria, hematuria, nausea vomiting , fever chills  Denies any fevers, chills, sweats, body ache, cough, shortness of breath, sinus pain or pressure, headache, abdominal pain, diarrhea  Past Medical History:  Diagnosis Date  . GERD (gastroesophageal reflux disease)   . Hypertension   . Thyroid disease    Social History   Tobacco Use  . Smoking status: Never Smoker  . Smokeless tobacco: Never Used  Substance Use Topics  . Alcohol use: No  . Drug use: No    Current Outpatient Medications:  .  levothyroxine (SYNTHROID) 100 MCG tablet, TAKE 1 TABLET BY MOUTH BEFORE BREAKFAST DAILY, Disp: 90 tablet, Rfl: 0 .  melatonin 5 MG TABS, TAKE 1 TABLET BY MOUTH DAILY AT BEDTIME, Disp: 90 tablet, Rfl: 3 .  Nutritional Supplements (NUTRITIONAL SUPPLEMENT PO), Take 1 tablet by mouth daily. Relora herbal supplement, Disp: , Rfl:  .  risperiDONE (RISPERDAL) 0.25 MG tablet, TAKE 1 TABLET BY MOUTH WITH LUNCH AND ONE WITH DINNER DAILY, Disp: 180 tablet, Rfl: 1 .  Valerian Root 450 MG CAPS, Take 1 capsule (450 mg total) by mouth daily as needed (anxiety or agitation)., Disp: 100 capsule, Rfl: 3  Depression screen [facility] 2/9 02/09/2020 11/03/2018 03/30/2018  Decreased Interest 0 0 0  Down, Depressed, Hopeless 0 0 0  PHQ - 2 Score 0 0 0    No flowsheet data  found.  -------------------------------------------------------------------------- O: No physical exam performed due to remote telephone encounter.  Lab results reviewed.  No results found for this or any previous visit (from the past 2160 hour(s)).  -------------------------------------------------------------------------- A&P:  Problem List Items Addressed This Visit    Severe protein-calorie malnutrition (HCC)   Late onset Alzheimer's disease with behavioral disturbance (HCC) - Primary    Chronic progressive decline w/ Alzheimer's dementia, clinically FAST Stage 6 - At risk of progression to FAST 7 - History discharged from hospice due to weight gain Increasing assistance with ADL needed and worsening behavior and wandering, may increase risk to patient. - Increased caregiver burden, limited financial and availability - Meds: prior on Aricept, now off  Plan: 1. WIll complete updated FL2 and TB PPD today to proceed w/ admission to [facility] - FL2 should be faxed to [phone] this afternoon, 02/09/20. Copy is available for pick up at our office. Continue current med regimen, Risperidone 0.25mg  BID now, in future we have option to add PRN dose or inc to 0.5 BID - Additionally with Melatonin 5mg  nightly, Valerian Root 450mg  daily PRN agitation, Relora Herbal Supplement daily - Follow-up as needed, if dramatic worsening or concern for her own safety then she should go to hospital ED for evaluation      Hypothyroidism    Chronic hypothyroidism Continue current dose Levothyroxine daily      Hypertension   GERD (gastroesophageal reflux disease)     Additionally patient's daughter/caregiver Vickie spoke with LCSW (CCM) today.  No orders of the defined types were placed in this encounter.   Follow-up: - Return as needed  Patient verbalizes understanding with the above medical recommendations including the limitation of remote medical  advice.  Specific follow-up and call-back criteria were given for patient to follow-up or seek medical care more urgently if needed.   - Time spent in direct consultation with patient on phone: 10 minutes   , DO [facility] 02/09/2020, 11:48 AM

## 2020-02-09 NOTE — Assessment & Plan Note (Signed)
Chronic hypothyroidism Continue current dose Levothyroxine daily

## 2020-02-09 NOTE — Telephone Encounter (Signed)
Copied from CRM [id number]. Topic: General - Other >> Feb 09, 2020 12:01 PM Dalphine Handing A wrote: Patient returned call in regards to fl2 paperwork and was advised during virtual visit today by the facility manager for patient to call back to "hear patients voice". Please advise  Best contact [phone]

## 2020-02-09 NOTE — Telephone Encounter (Signed)
Spoke to [facility] and patient and as per Dr Kirtland Bouchard advised paper work were faxed and copy left up front for patient's daughter Larene Beach.

## 2020-02-09 NOTE — Assessment & Plan Note (Signed)
Chronic progressive decline w/ Alzheimer's dementia, clinically FAST Stage 6 - At risk of progression to FAST 7 - History discharged from hospice due to weight gain Increasing assistance with ADL needed and worsening behavior and wandering, may increase risk to patient. - Increased caregiver burden, limited financial and availability - Meds: prior on Aricept, now off  Plan: 1. WIll complete updated FL2 and TB PPD today to proceed w/ admission to [facility] - FL2 should be faxed to [phone] this afternoon, 02/09/20. Copy is available for pick up at our office. Continue current med regimen, Risperidone 0.25mg  BID now, in future we have option to add PRN dose or inc to 0.5 BID - Additionally with Melatonin 5mg  nightly, Valerian Root 450mg  daily PRN agitation, Relora Herbal Supplement daily - Follow-up as needed, if dramatic worsening or concern for her own safety then she should go to hospital ED for evaluation

## 2020-02-29 ENCOUNTER — Telehealth: Payer: Medicare Other

## 2020-03-07 ENCOUNTER — Telehealth: Payer: Medicare Other

## 2020-03-08 ENCOUNTER — Telehealth: Payer: Self-pay | Admitting: Licensed Clinical Social Worker

## 2020-03-08 ENCOUNTER — Telehealth: Payer: Medicare Other

## 2020-03-08 NOTE — Telephone Encounter (Addendum)
  Chronic Care Management    Clinical Social Work General Follow Up Note  03/08/2020 Name: Megan Carpenter MRN: [id number] DOB: August 02, 1928  Megan Carpenter is a 86 y.o. year old female who is a primary care patient of Smitty Cords, DO. The CCM team was consulted for assistance with Level of Care Concerns.   Review of patient status, including review of consultants reports, relevant laboratory and other test results, and collaboration with appropriate care team members and the patient's provider was performed as part of comprehensive patient evaluation and provision of chronic care management services.    LCSW completed CCM outreach third and final attempt today but was unable to reach patient successfully. A HIPPA compliant voice message was left encouraging patient to return call once available if still interested in social work support. LCSW updated Scheduling Care Guide as well.   Outpatient Encounter Medications as of 03/08/2020  Medication Sig  . levothyroxine (SYNTHROID) 100 MCG tablet TAKE 1 TABLET BY MOUTH BEFORE BREAKFAST DAILY  . melatonin 5 MG TABS TAKE 1 TABLET BY MOUTH DAILY AT BEDTIME  . Nutritional Supplements (NUTRITIONAL SUPPLEMENT PO) Take 1 tablet by mouth daily. Relora herbal supplement  . risperiDONE (RISPERDAL) 0.25 MG tablet TAKE 1 TABLET BY MOUTH WITH LUNCH AND ONE WITH DINNER DAILY  . Valerian Root 450 MG CAPS Take 1 capsule (450 mg total) by mouth daily as needed (anxiety or agitation).   No facility-administered encounter medications on file as of 03/08/2020.    Follow Up Plan: PCP will complete additional referral for CCM Social Work if patient wishes to gain services. Three unsuccessful outreach attempts completed.    [name], BSW, MSW, LCSW [facility]  [facility] [email or web address] Phone: [phone]

## 2020-03-08 NOTE — Telephone Encounter (Signed)
Hey  It states in appt notes this was third attempt do you want to document in not that it was third

## 2020-03-16 ENCOUNTER — Telehealth: Payer: Self-pay

## 2020-03-16 ENCOUNTER — Telehealth: Payer: Self-pay | Admitting: General Practice

## 2020-03-16 NOTE — Telephone Encounter (Signed)
°  Chronic Care Management   Outreach Note  03/16/2020 Name: Megan Carpenter MRN: [id number] DOB: 27-Aug-1928  Referred by: Smitty Cords, DO Reason for referral : Chronic Care Management (RNCM Follow up for Chronic Disease Management and Cre Coordiination Needs)   A second unsuccessful telephone outreach was attempted today. The patient was referred to the case management team for assistance with care management and care coordination.   Follow Up Plan: A HIPAA compliant phone message was left for the patient providing contact information and requesting a return call.   Alto Denver RN, MSN, CCM Community Care Coordinator [place]   [facility] [place] Mobile: [phone]

## 2020-04-13 ENCOUNTER — Telehealth: Payer: Self-pay

## 2020-04-13 ENCOUNTER — Ambulatory Visit: Payer: Self-pay | Admitting: General Practice

## 2020-04-13 NOTE — Patient Instructions (Signed)
Visit Information  There are no care plans to display for this patient.     No further follow up required: the patient is now LTCF  Alto Denver RN, MSN, CCM Community Care Coordinator [facility]   [facility] [place] Mobile: [phone]

## 2020-04-13 NOTE — Chronic Care Management (AMB) (Signed)
Care Management   Follow Up Note   04/13/2020 Name: ZARIANNA DICARLO MRN: [id number] DOB: 02-Apr-1929  ROSHAWN LACINA is enrolled in a Managed Medicaid plan: No. No outreach today. The patient is resident of LTCF.    Referred by: Smitty Cords, DO Reason for referral : Care Coordination (RNCM: The patient resides in LTC facility. Closing goals. )   SYBIL SHRADER is a 86 y.o. year old female who is a primary care patient of Smitty Cords, DO. The care management team was consulted for assistance with care management and care coordination needs.    Review of patient status, including review of consultants reports, relevant laboratory and other test results, and collaboration with appropriate care team members and the patient's provider was performed as part of comprehensive patient evaluation and provision of chronic care management services.    Goals Addressed              This Visit's Progress     COMPLETED: RNCM-I need help caring for mom (pt-stated)        Current Barriers: Goal being closed. The patient is a resident of LTCF  Lacks caregiver support.   Corporate treasurer.   Cognitive Deficits  Patient is now living at an assisted living: [facility].  She has been there since the last week of March.  12-02-2019: Update- the patient and family can not afford for the patient to stay at [facility] any longer and the patient will need to move to a different facility. The daughter is working on this now. 02-03-2020: [facility] is closing and the daughter is working on finding a new facility. [facility] will close on 02-14-2020.   Nurse Case Manager Clinical Goal(s):   Over the next 120 days, caregiver will work with [facility] to address needs related to finding assistance for patient in the home.   Interventions:   Evaluation of Living arrangements:  She has gone to  [employer] [facility].  The patient is doing well and  adjusting. The daughter is happy that she is doing well. At first she kept asking to go home but she is doing much better now. 12-02-2019: Due to the out of pocket cost the patient can not afford to stay at [facility] and [facility]. The daughter is working with [employer] and they will do an evaluation today. The patients daughter is hopeful to find the patient a new place to stay that is affordable. 02-03-2020: [facility] is closing and the daughter is hopeful to get the patient into a home in [place]. She will know more tomorrow after the caregiver goes to evaluate the patient tonight.   Discussed plans with patient for ongoing care management follow up and provided patient with direct contact information for care management team  Provided patient and/or caregiver with emailed information about [facility], and [facility]  ([employer]). Completed  [facility] [phone]  [facility] [phone]  [facility]  [phone]  Empathetic listening listening to daughter as she discussed financial and caregiver stress. The daughter wants to make sure her mom is somewhere safe. 02-03-2020: The patients daughter is really stressed out about the situation with the patient. [facility] denied [facility] because the patient does not need as much nursing care and they are not prepared to take patients that may wander. She is being evaluated today by other places and the daughter is hopeful  this will work out.   Evaluation: Daughter reports patient is continuing at a steady weight and has not had any more falls. 12-02-2019: The patient is doing well at [facility]; however they  have run out of funding for the patient to be able to stay there. 02-03-2020: Is currently working on finding new place for the patient to live. The daughter will keep the RNCM updated.   Education and support provided to the daughter. Reminded the daughter to call the insurance  company regularly for updates to the paperwork and process. 12-02-2019: The daughter has been working with the LCSW and CCM team. Empathetic listening and support given. The patients daughter is thankful for the support of the CCM team and pcp.    Patient Self Care Activities:   Currently UNABLE TO independently complete ADLs or IDALs  Please see past updates related to this goal by clicking on the "Past Updates" button in the selected goal        COMPLETED: RNCM: Pt's daughter- "She has what needs right now" (pt-stated)        CARE PLAN ENTRY (see longtitudinal plan of care for additional care plan information)  Current Barriers: The patient is being managed at LTCF closing goal  Chronic Disease Management support, education, and care coordination needs related to HTN, Anxiety, and Alzheimer's  Clinical Goal(s) related to HTN, Anxiety, and Alzheimer's :  Over the next 120 days, patient will:   Work with the care management team to address educational, disease management, and care coordination needs   Begin or continue self health monitoring activities as directed today Measure and record blood pressure 1 times per week and adhere to a heart healthy diet  Call provider office for new or worsened signs and symptoms Blood pressure findings outside established parameters and New or worsened symptom related to Anxiety/Alzheimer's and other chronic conditions  Call care management team with questions or concerns  Verbalize basic understanding of patient centered plan of care established today  Interventions related to HTN, Anxiety, and Alzheimer's :   Evaluation of current treatment plans and patient's adherence to plan as established by provider  Assessed patient understanding of disease states.  Vickie, the patients daughter verbalized understanding of chronic conditions and how well the patient is doing at the new facility. 12-02-2019: The patients chronic health conditions are stable.  The patient will be moving when a new facility is found. The patient does not need a lot of nursing care and this is causing it to be harder to find suitable facility for her to go. 02-03-2020: The patient is going to have to move. [facility] closing and the daughter is currently working with a new place in [place] to get the patient moved there. The daughter will know more tomorrow after the caregiver of the home goes and evaluates the patient today.   Assessed patient's education and care coordination needs.  The patient has what she needs at the assisted living. Adjusting well. The daughter knows she can call the [facility] or pcp for questions, needs or concerns. 02-03-2020: The patient is stable at this time. She will be 89 on Monday.   Provided disease specific education to patient.  Education and support given.   Collaborated with appropriate clinical care team members regarding patient needs.  The daughter is working with the LCSW and CCM team to help with meeting the needs of the patient.   Patient Self Care Activities related to HTN, Anxiety, and  Alzheimer's :   Patient is unable to independently self-manage chronic health conditions  Please see past updates related to this goal by clicking on the "Past Updates" button in the selected goal          No further follow up required: the patient is resident of LTCF. CCM services closed.  Alto Denver RN, MSN, CCM Community Care Coordinator [place]   [facility] [place] Mobile: [phone]

## 2020-05-04 ENCOUNTER — Emergency Department: Payer: Medicare Other

## 2020-05-04 ENCOUNTER — Encounter: Payer: Self-pay | Admitting: Medical Oncology

## 2020-05-04 ENCOUNTER — Inpatient Hospital Stay
Admission: EM | Admit: 2020-05-04 | Discharge: 2020-05-12 | DRG: 522 | Disposition: A | Discharge status: Skilled Nursing Facility | Payer: Medicare Other | Attending: Surgery | Admitting: Surgery

## 2020-05-04 ENCOUNTER — Other Ambulatory Visit: Payer: Self-pay

## 2020-05-04 DIAGNOSIS — Z880 Allergy status to penicillin: Secondary | ICD-10-CM | POA: Diagnosis not present

## 2020-05-04 DIAGNOSIS — Y92121 Bathroom in nursing home as the place of occurrence of the external cause: Secondary | ICD-10-CM

## 2020-05-04 DIAGNOSIS — G301 Alzheimer's disease with late onset: Secondary | ICD-10-CM | POA: Diagnosis present

## 2020-05-04 DIAGNOSIS — M810 Age-related osteoporosis without current pathological fracture: Secondary | ICD-10-CM | POA: Diagnosis present

## 2020-05-04 DIAGNOSIS — E039 Hypothyroidism, unspecified: Secondary | ICD-10-CM | POA: Diagnosis present

## 2020-05-04 DIAGNOSIS — J019 Acute sinusitis, unspecified: Secondary | ICD-10-CM | POA: Diagnosis present

## 2020-05-04 DIAGNOSIS — S72009A Fracture of unspecified part of neck of unspecified femur, initial encounter for closed fracture: Secondary | ICD-10-CM | POA: Diagnosis present

## 2020-05-04 DIAGNOSIS — W010XXA Fall on same level from slipping, tripping and stumbling without subsequent striking against object, initial encounter: Secondary | ICD-10-CM | POA: Diagnosis present

## 2020-05-04 DIAGNOSIS — S72002A Fracture of unspecified part of neck of left femur, initial encounter for closed fracture: Secondary | ICD-10-CM

## 2020-05-04 DIAGNOSIS — S72012A Unspecified intracapsular fracture of left femur, initial encounter for closed fracture: Principal | ICD-10-CM | POA: Diagnosis present

## 2020-05-04 DIAGNOSIS — Z79899 Other long term (current) drug therapy: Secondary | ICD-10-CM

## 2020-05-04 DIAGNOSIS — Z96649 Presence of unspecified artificial hip joint: Secondary | ICD-10-CM

## 2020-05-04 DIAGNOSIS — I10 Essential (primary) hypertension: Secondary | ICD-10-CM | POA: Diagnosis present

## 2020-05-04 DIAGNOSIS — R739 Hyperglycemia, unspecified: Secondary | ICD-10-CM | POA: Diagnosis present

## 2020-05-04 DIAGNOSIS — Z20822 Contact with and (suspected) exposure to covid-19: Secondary | ICD-10-CM | POA: Diagnosis present

## 2020-05-04 DIAGNOSIS — F0280 Dementia in other diseases classified elsewhere without behavioral disturbance: Secondary | ICD-10-CM | POA: Diagnosis present

## 2020-05-04 DIAGNOSIS — K219 Gastro-esophageal reflux disease without esophagitis: Secondary | ICD-10-CM | POA: Diagnosis present

## 2020-05-04 DIAGNOSIS — Z79890 Hormone replacement therapy (postmenopausal): Secondary | ICD-10-CM

## 2020-05-04 DIAGNOSIS — W19XXXA Unspecified fall, initial encounter: Secondary | ICD-10-CM

## 2020-05-04 LAB — BASIC METABOLIC PANEL
Anion gap: 12 (ref 5–15)
BUN: 20 mg/dL (ref 8–23)
CO2: 24 mmol/L (ref 22–32)
Calcium: 9.1 mg/dL (ref 8.9–10.3)
Chloride: 102 mmol/L (ref 98–111)
Creatinine, Ser: 0.92 mg/dL (ref 0.44–1.00)
GFR, Estimated: 60 mL/min — ABNORMAL LOW (ref >60)
Glucose, Bld: 235 mg/dL — ABNORMAL HIGH (ref 70–99)
Potassium: 4.3 mmol/L (ref 3.5–5.1)
Sodium: 138 mmol/L (ref 135–145)

## 2020-05-04 LAB — TYPE AND SCREEN
ABO/RH(D): O POS
Antibody Screen: NEGATIVE

## 2020-05-04 LAB — CBC
HCT: 45.5 % (ref 36.0–46.0)
Hemoglobin: 14.7 g/dL (ref 12.0–15.0)
MCH: 29.3 pg (ref 26.0–34.0)
MCHC: 32.3 g/dL (ref 30.0–36.0)
MCV: 90.8 fL (ref 80.0–100.0)
Platelets: 194 10*3/uL (ref 150–400)
RBC: 5.01 MIL/uL (ref 3.87–5.11)
RDW: 13.1 % (ref 11.5–15.5)
WBC: 18.5 10*3/uL — ABNORMAL HIGH (ref 4.0–10.5)
nRBC: 0 % (ref 0.0–0.2)

## 2020-05-04 LAB — TSH: TSH: 3.829 u[IU]/mL (ref 0.350–4.500)

## 2020-05-04 LAB — SARS CORONAVIRUS 2 BY RT PCR (HOSPITAL ORDER, PERFORMED IN ~~LOC~~ HOSPITAL LAB): SARS Coronavirus 2: NEGATIVE

## 2020-05-04 MED ORDER — MORPHINE SULFATE (PF) 2 MG/ML IV SOLN: 0.5000 mg | INTRAVENOUS | Status: DC | PRN

## 2020-05-04 MED ORDER — RISPERIDONE 0.25 MG PO TABS
0.2500 mg | ORAL_TABLET | Freq: Every day | ORAL | Status: DC
  Administered 2020-05-05: 0.25 mg via ORAL
  Filled 2020-05-04 (×4): qty 1

## 2020-05-04 MED ORDER — CEFAZOLIN SODIUM-DEXTROSE 2-4 GM/100ML-% IV SOLN
2.0000 g | INTRAVENOUS | Status: AC
  Administered 2020-05-05: 2 g via INTRAVENOUS
  Filled 2020-05-04 (×2): qty 100

## 2020-05-04 MED ORDER — HYDROCODONE-ACETAMINOPHEN 5-325 MG PO TABS
1.0000 | ORAL_TABLET | Freq: Four times a day (QID) | ORAL | Status: DC | PRN
  Administered 2020-05-05: 1 via ORAL
  Administered 2020-05-06 – 2020-05-07 (×4): 2 via ORAL
  Administered 2020-05-08 – 2020-05-10 (×5): 1 via ORAL
  Filled 2020-05-04 (×2): qty 1
  Filled 2020-05-04: qty 2
  Filled 2020-05-04 (×2): qty 1
  Filled 2020-05-04 (×2): qty 2
  Filled 2020-05-04 (×3): qty 1
  Filled 2020-05-04: qty 2

## 2020-05-04 MED ORDER — FENTANYL CITRATE (PF) 100 MCG/2ML IJ SOLN
50.0000 ug | INTRAMUSCULAR | Status: DC | PRN
  Administered 2020-05-04 (×2): 50 ug via INTRAVENOUS
  Filled 2020-05-04 (×2): qty 2

## 2020-05-04 MED ORDER — LEVOTHYROXINE SODIUM 50 MCG PO TABS
100.0000 ug | ORAL_TABLET | Freq: Every day | ORAL | Status: DC
  Administered 2020-05-06 – 2020-05-12 (×7): 100 ug via ORAL
  Filled 2020-05-04 (×7): qty 2

## 2020-05-04 MED ORDER — ONDANSETRON HCL 4 MG/2ML IJ SOLN: 4.0000 mg | Freq: Four times a day (QID) | INTRAMUSCULAR | Status: DC | PRN

## 2020-05-04 MED ORDER — CEFAZOLIN SODIUM-DEXTROSE 2-4 GM/100ML-% IV SOLN: 2.0000 g | INTRAVENOUS | Status: DC

## 2020-05-04 MED ORDER — AZITHROMYCIN 500 MG PO TABS
500.0000 mg | ORAL_TABLET | Freq: Every day | ORAL | Status: AC
  Administered 2020-05-04 – 2020-05-08 (×5): 500 mg via ORAL
  Filled 2020-05-04 (×4): qty 1

## 2020-05-04 NOTE — Consult Note (Signed)
ORTHOPAEDIC CONSULTATION  REQUESTING PHYSICIAN: Minna Antis, MD  Chief Complaint:   Left hip pain.  History of Present Illness: Megan Carpenter is a 87 y.o. female with a history of hypertension, gastroesophageal reflux disease, hypothyroidism, and dementia who lives in an assisted care facility.  The patient was in her usual state of health this morning when she apparently got up, felt dizzy, and fell to the ground, landing on her left side.  She was brought to the emergency room where x-rays demonstrated a displaced left femoral neck fracture.  The patient denies any associated injuries.  She did not strike her head or lose consciousness.  The patient did note some dizziness which may have contributed to her fall, but denies any chest pain, shortness of breath, or other symptoms.  Past Medical History:  Diagnosis Date  . GERD (gastroesophageal reflux disease)   . Hypertension   . Thyroid disease    History reviewed. No pertinent surgical history. Social History   Socioeconomic History  . Marital status: Married    Spouse name: Not on file  . Number of children: Not on file  . Years of education: Not on file  . Highest education level: Not on file  Occupational History  . Not on file  Tobacco Use  . Smoking status: Never Smoker  . Smokeless tobacco: Never Used  Substance and Sexual Activity  . Alcohol use: No  . Drug use: No  . Sexual activity: Not on file  Other Topics Concern  . Not on file  Social History Narrative  . Not on file   Social Determinants of Health   Financial Resource Strain: Not on file  Food Insecurity: Not on file  Transportation Needs: Not on file  Physical Activity: Not on file  Stress: Not on file  Social Connections: Not on file   Family History  Family history unknown: Yes   Allergies  Allergen Reactions  . Penicillins Hives   Prior to Admission medications    Medication Sig Start Date End Date Taking? Authorizing Provider  levothyroxine (SYNTHROID) 100 MCG tablet TAKE 1 TABLET BY MOUTH BEFORE BREAKFAST DAILY 12/24/19   Althea Charon, Netta Neat, DO  melatonin 5 MG TABS TAKE 1 TABLET BY MOUTH DAILY AT BEDTIME 07/14/19   Karamalegos, Netta Neat, DO  Nutritional Supplements (NUTRITIONAL SUPPLEMENT PO) Take 1 tablet by mouth daily. Relora herbal supplement    [provider]  risperiDONE (RISPERDAL) 0.25 MG tablet TAKE 1 TABLET BY MOUTH WITH LUNCH AND ONE WITH DINNER DAILY 09/02/19   Karamalegos, Netta Neat, DO  Valerian Root 450 MG CAPS Take 1 capsule (450 mg total) by mouth daily as needed (anxiety or agitation). 07/22/19   Smitty Cords, DO   DG Chest 1 View  Result Date: 05/04/2020 CLINICAL DATA:  Preop.  History of hypertension. EXAM: CHEST  1 VIEW COMPARISON:  12/24/2007 FINDINGS: Cardiac silhouette is normal in size. No mediastinal or hilar masses. Lungs demonstrate prominent bronchovascular markings bilaterally. No evidence of pneumonia or pulmonary edema. No pleural effusion or pneumothorax. Skeletal structures are demineralized but grossly intact. IMPRESSION: No acute cardiopulmonary disease. Electronically Signed   By: Amie Portland M.D.   On: 05/04/2020 14:05   CT Head Wo Contrast  Result Date: 05/04/2020 CLINICAL DATA:  Pain following fall EXAM: CT HEAD WITHOUT CONTRAST TECHNIQUE: Contiguous axial images were obtained from the base of the skull through the vertex without intravenous contrast. COMPARISON:  November 28, 2018 FINDINGS: Brain: Ventricles and sulci are normal in  size and configuration for age. There is no intracranial mass, hemorrhage, extra-axial fluid collection, or midline shift. There is stable patchy small vessel disease in the centra semiovale bilaterally. No evident acute infarct. Vascular: No hyperdense vessel. There is calcification in each carotid siphon region. Skull: Bony calvarium appears intact.  Sinuses/Orbits: Mucosal thickening noted in several ethmoid air cells. Other visualized paranasal sinuses are clear. Orbits appear symmetric bilaterally. Other: There is airspace opacity in multiple mastoid air cells bilaterally. IMPRESSION: Patchy periventricular small vessel disease, stable. No acute infarct. No mass or hemorrhage. There are foci of arterial vascular calcification. There is mucosal thickening in several ethmoid air cells. There are scattered areas of airspace opacity in multiple mastoid air cells bilaterally. Electronically Signed   By: Bretta Bang III M.D.   On: 05/04/2020 13:39   DG HIP UNILAT WITH PELVIS 2-3 VIEWS LEFT  Result Date: 05/04/2020 CLINICAL DATA:  Fall.  Left hip pain. EXAM: DG HIP (WITH OR WITHOUT PELVIS) 2-3V LEFT COMPARISON:  None. FINDINGS: Displaced subcapital fracture of the left femoral neck. Distal fracture component has displaced superiorly by 2.5 cm. There is varus angulation between femoral head distal fracture component. No other fractures.  No bone lesions. Hip joints, SI joints and symphysis pubis are normally aligned. Soft tissues are unremarkable. IMPRESSION: 1. Displaced subcapital left femoral neck fracture with varus angulation. No significant comminution. No dislocation Electronically Signed   By: Amie Portland M.D.   On: 05/04/2020 14:06    Positive ROS: All other systems have been reviewed and were otherwise negative with the exception of those mentioned in the HPI and as above.  Physical Exam: General:  Alert, no acute distress Psychiatric:  Patient is not competent for consent, but exhibits normal mood and affect   Cardiovascular:  No pedal edema Respiratory:  No wheezing, non-labored breathing GI:  Abdomen is soft and non-tender Skin:  No lesions in the area of chief complaint Neurologic:  Sensation intact distally Lymphatic:  No axillary or cervical lymphadenopathy  Orthopedic Exam:  Orthopedic examination is limited to the left  hip and lower extremity.  The left lower extremity is shortened and externally rotated as compared to the right lower extremity.  Skin inspection of the left hip is unremarkable.  No swelling, erythema, ecchymosis, abrasions, or other skin abnormalities are identified.  She has mild tenderness to palpation of the lateral aspect of the left hip.  She has more severe pain with any attempted active or passive motion of the left hip.  She is neurovascularly intact to the left lower extremity and foot as she exam demonstrates the ability to dorsiflex and plantarflex her toes and ankle.  Sensation is intact to light touch to all distributions.  She has fairly good capillary refill to her left foot.  X-rays:  X-rays of the pelvis and left hip are available for review and have been reviewed by myself.  These films demonstrate a varus angulated displaced left femoral neck fracture.  No significant degenerative changes are identified.  No lytic lesions or other acute bony abnormalities are noted.  Assessment: Displaced left femoral neck fracture.  Plan: The treatment options have been discussed with the patient, as well as by phone with her daughter, who is the patient's power of attorney, including both surgical and nonsurgical choices.  The patient and her daughter would like to proceed with surgical intervention to include a left hip hemiarthroplasty.  This procedure has been discussed in detail, as have the potential risks (including bleeding,  infection, nerve and/or blood vessel injury, persistent or recurrent pain, dislocation, leg length inequality, loosening of and/or failure of the components, need for further surgery, blood clots, strokes, heart attacks and/or arrhythmias, etc.) and benefits.  The patient and her daughter state their understanding and wish to proceed.  A telephone consent  has been obtained from the patient's daughter.  Thank you for asking me to participate in the care of this most  pleasant yet unfortunate woman.  I will be happy to follow her with you.   Maryagnes Amos, MD  Beeper #:  [phone]  05/04/2020 2:34 PM

## 2020-05-04 NOTE — H&P (Addendum)
History and Physical    Megan Carpenter OAC:[id number] DOB: 06-06-1928 DOA: 05/04/2020  PCP: Smitty Cords, DO  Patient coming from: [facility]  I have personally briefly reviewed patient's old medical records in [facility]  Chief Complaint: left leg and arm pain s/p fall  HPI: Megan Carpenter is a 87 y.o. female with medical history significant of  GERD, HTN, hypothyroidism,Chronic dementia, Alzheimer's type,progressive decline with history of sundowning on Risperidone 0.25mg   Per last pcp note for behavioral disturbances and sleep.Patient currently resides in NH.Per daughter called by facility this am and was told patient slipped in the bathroom and had injury to her left hip. Patient gives different history however does also note that she had a fall. She notes she did not have loc but she did experience left hip and arm pain and notes she is unable to bear weight on left leg. Of note fall occurred last pm.Patient was evaluated at NH this am and transferred to ED for concern for possible fracture. Patient currently note pain on the left upper leg and groin. She denies chest pain , sob,f/c n/v/d/abdominal/ or dysuria. ON evaluation in ed patient was found to have  displaced left femoral neck fracture. Orthopedics consulted who will take patient to OR this pm for repair.  ED Course: Afeb, bp 153/71, hr 85, rr 18, sat 96% on ra  Wbc: 18.5, hgb14.7, mcv 90.8 NA138, K4.3, cl 102, glu 235, cr0.92 CT head: Patchy periventricular small vessel disease, stable. No acute infarct. No mass or hemorrhage.  There are foci of arterial vascular calcification.  There is mucosal thickening in several ethmoid air cells. There are scattered areas of airspace opacity in multiple mastoid air cells Bilaterally.  Cxr:NAD  Xray: left hip 1. Displaced subcapital left femoral neck fracture with varus angulation. No significant comminution. No dislocation  Respiratory  panel: neg for covid  tx in ed fentanyl 50 mcg  Review of Systems: As per HPI otherwise 10 point review of systems negative.   Past Medical History:  Diagnosis Date  . GERD (gastroesophageal reflux disease)   . Hypertension   . Thyroid disease     History reviewed. No pertinent surgical history.   reports that she has never smoked. She has never used smokeless tobacco. She reports that she does not drink alcohol and does not use drugs.  Allergies  Allergen Reactions  . Penicillins Hives    Family History  Family history unknown: Yes    Prior to Admission medications   Medication Sig Start Date End Date Taking? Authorizing Provider  levothyroxine (SYNTHROID) 100 MCG tablet TAKE 1 TABLET BY MOUTH BEFORE BREAKFAST DAILY 12/24/19   Althea Charon, Netta Neat, DO  melatonin 5 MG TABS TAKE 1 TABLET BY MOUTH DAILY AT BEDTIME 07/14/19   Karamalegos, Netta Neat, DO  Nutritional Supplements (NUTRITIONAL SUPPLEMENT PO) Take 1 tablet by mouth daily. Relora herbal supplement    [provider]  risperiDONE (RISPERDAL) 0.25 MG tablet TAKE 1 TABLET BY MOUTH WITH LUNCH AND ONE WITH DINNER DAILY 09/02/19   Karamalegos, Netta Neat, DO  Valerian Root 450 MG CAPS Take 1 capsule (450 mg total) by mouth daily as needed (anxiety or agitation). 07/22/19   Smitty Cords, DO    Physical Exam: Vitals:   05/04/20 0858 05/04/20 1142 05/04/20 1458 05/04/20 1500  BP:  (!) 155/77 (!) 164/72 (!) 164/131  Pulse:  96 91 94  Resp:  12 17   Temp:  97.6 F (36.4  C)    TempSrc:  Oral    SpO2:  93% 95% 92%  Weight:      Height: 5\' 4"  (1.626 m)        Vitals:   05/04/20 0858 05/04/20 1142 05/04/20 1458 05/04/20 1500  BP:  (!) 155/77 (!) 164/72 (!) 164/131  Pulse:  96 91 94  Resp:  12 17   Temp:  97.6 F (36.4 C)    TempSrc:  Oral    SpO2:  93% 95% 92%  Weight:      Height: 5\' 4"  (1.626 m)     Constitutional: NAD, calm, comfortable Eyes: PERRL, lids and conjunctivae normal ENMT:  Mucous membranes are moist. Posterior pharynx clear of any exudate or lesions.Normal dentition.  Neck: normal, supple, no masses, no thyromegaly Respiratory: clear to auscultation bilaterally, no wheezing, no crackles. Normal respiratory effort. No accessory muscle use.  Cardiovascular: Regular rate and rhythm, no murmurs / rubs / gallops. No extremity edema. 2+ pedal pulses. No carotid bruits.  Abdomen: no tenderness, no masses palpated. No hepatosplenomegaly. Bowel sounds positive.  Musculoskeletal: no clubbing / cyanosis. Left lower extremity externally rotated, tender palpation anterior thigh,pain with minimal movement.  no contractures. Normal muscle tone.  Skin: no rashes, lesions, ulcers. No induration Neurologic: CN 2-12 grossly intact. Sensation intact, DTR normal. Strength 5/5 in all 4.  Psychiatric: alert to self and place,  Normal mood,confused   Labs on Admission: I have personally reviewed following labs and imaging studies  CBC: Recent Labs  Lab 05/04/20 0900  WBC 18.5*  HGB 14.7  HCT 45.5  MCV 90.8  PLT 194   Basic Metabolic Panel: Recent Labs  Lab 05/04/20 0900  NA 138  K 4.3  CL 102  CO2 24  GLUCOSE 235*  BUN 20  CREATININE 0.92  CALCIUM 9.1   GFR: Estimated Creatinine Clearance: 38.7 mL/min (by C-G formula based on SCr of 0.92 mg/dL). Liver Function Tests: No results for input(s): AST, ALT, ALKPHOS, BILITOT, PROT, ALBUMIN in the last 168 hours. No results for input(s): LIPASE, AMYLASE in the last 168 hours. No results for input(s): AMMONIA in the last 168 hours. Coagulation Profile: No results for input(s): INR, PROTIME in the last 168 hours. Cardiac Enzymes: No results for input(s): CKTOTAL, CKMB, CKMBINDEX, TROPONINI in the last 168 hours. BNP (last 3 results) No results for input(s): PROBNP in the last 8760 hours. HbA1C: No results for input(s): HGBA1C in the last 72 hours. CBG: No results for input(s): GLUCAP in the last 168 hours. Lipid  Profile: No results for input(s): CHOL, HDL, LDLCALC, TRIG, CHOLHDL, LDLDIRECT in the last 72 hours. Thyroid Function Tests: No results for input(s): TSH, T4TOTAL, FREET4, T3FREE, THYROIDAB in the last 72 hours. Anemia Panel: No results for input(s): VITAMINB12, FOLATE, FERRITIN, TIBC, IRON, RETICCTPCT in the last 72 hours. Urine analysis:    Component Value Date/Time   COLORURINE YELLOW (A) 11/28/2018 1532   APPEARANCEUR HAZY (A) 11/28/2018 1532   LABSPEC 1.020 11/28/2018 1532   PHURINE 6.0 11/28/2018 1532   GLUCOSEU 50 (A) 11/28/2018 1532   HGBUR MODERATE (A) 11/28/2018 1532   BILIRUBINUR NEGATIVE 11/28/2018 1532   KETONESUR NEGATIVE 11/28/2018 1532   PROTEINUR 30 (A) 11/28/2018 1532   NITRITE NEGATIVE 11/28/2018 1532   LEUKOCYTESUR NEGATIVE 11/28/2018 1532    Radiological Exams on Admission: DG Chest 1 View  Result Date: 05/04/2020 CLINICAL DATA:  Preop.  History of hypertension. EXAM: CHEST  1 VIEW COMPARISON:  12/24/2007 FINDINGS: Cardiac silhouette is normal  in size. No mediastinal or hilar masses. Lungs demonstrate prominent bronchovascular markings bilaterally. No evidence of pneumonia or pulmonary edema. No pleural effusion or pneumothorax. Skeletal structures are demineralized but grossly intact. IMPRESSION: No acute cardiopulmonary disease. Electronically Signed   By: Amie Portland M.D.   On: 05/04/2020 14:05   CT Head Wo Contrast  Result Date: 05/04/2020 CLINICAL DATA:  Pain following fall EXAM: CT HEAD WITHOUT CONTRAST TECHNIQUE: Contiguous axial images were obtained from the base of the skull through the vertex without intravenous contrast. COMPARISON:  November 28, 2018 FINDINGS: Brain: Ventricles and sulci are normal in size and configuration for age. There is no intracranial mass, hemorrhage, extra-axial fluid collection, or midline shift. There is stable patchy small vessel disease in the centra semiovale bilaterally. No evident acute infarct. Vascular: No hyperdense  vessel. There is calcification in each carotid siphon region. Skull: Bony calvarium appears intact. Sinuses/Orbits: Mucosal thickening noted in several ethmoid air cells. Other visualized paranasal sinuses are clear. Orbits appear symmetric bilaterally. Other: There is airspace opacity in multiple mastoid air cells bilaterally. IMPRESSION: Patchy periventricular small vessel disease, stable. No acute infarct. No mass or hemorrhage. There are foci of arterial vascular calcification. There is mucosal thickening in several ethmoid air cells. There are scattered areas of airspace opacity in multiple mastoid air cells bilaterally. Electronically Signed   By: Bretta Bang III M.D.   On: 05/04/2020 13:39   DG HIP UNILAT WITH PELVIS 2-3 VIEWS LEFT  Result Date: 05/04/2020 CLINICAL DATA:  Fall.  Left hip pain. EXAM: DG HIP (WITH OR WITHOUT PELVIS) 2-3V LEFT COMPARISON:  None. FINDINGS: Displaced subcapital fracture of the left femoral neck. Distal fracture component has displaced superiorly by 2.5 cm. There is varus angulation between femoral head distal fracture component. No other fractures.  No bone lesions. Hip joints, SI joints and symphysis pubis are normally aligned. Soft tissues are unremarkable. IMPRESSION: 1. Displaced subcapital left femoral neck fracture with varus angulation. No significant comminution. No dislocation Electronically Signed   By: Amie Portland M.D.   On: 05/04/2020 14:06    EKG: Independently reviewed. See above  Assessment/Plan  Displaced subcapital fracture of the left femoral neck -place on hip fracture protocol  -plan for OR time this pm  -npo  - patient chronic medical problems appears stable no history of pulmonary or cardiac diagnosis, patient is medically optimized and cleared for surgery.  Patient is a low risk for intermediate risk surgery,. Displaced subcapital fracture of the left femoral neck   Acute Sinusitis  -noted on CT scan  - patient also with increase  wbc -most likely  Multifactorial mild infection/ stress response  -will start zpack -monitor fever curve, wbc counts  Hyperglycemia -check a1c  -monitor FS  GERD -no active issues   HTN -elevated due to pain  -treat pain  -currently not on anti htn medication at home   Hypothyroidism -resume synthroid  Chronic dementia, Alzheimer's type -progressive decline with history of sundowning  -continue  Risperidone 0.25mg   Per last pcp note for behavioral disturbances and sleep  DVT prophylaxis:  scd  Code Status:FULL Family Communication: none at beside  Disposition Plan: patient  expected to be admitted greater than 2 midnights Consults called: Poggi MD , orthopedics Admission status:inpatient   Lurline Del MD [facility]  If 7PM-7AM, please contact night-coverage [email or web address] Password [facility]  05/04/2020, 4:47 PM

## 2020-05-04 NOTE — ED Triage Notes (Signed)
Pt from [facility] via ems, reports of fall. Pt c/o left leg and arm pain. Pt states that she stood up and felt dizzy causing her to fall. Pt denies LOC.

## 2020-05-04 NOTE — ED Notes (Addendum)
Dr. Joice Lofts at bedside. Informed consent obtained and signed at this time with daughter (POA) on phone.

## 2020-05-04 NOTE — ED Provider Notes (Signed)
[facility] Emergency Department Provider Note  Time seen: 1:07 PM  I have reviewed the triage vital signs and the nursing notes.   HISTORY  Chief Complaint Near Syncope, Leg Pain, and Arm Pain   HPI Megan Carpenter is a 87 y.o. female with a past medical history of hypertension, gastric reflux, hypothyroidism, presents to the emergency department from her assisted living facility after a fall.  Patient states she stood up and felt dizzy and may have had a near syncopal or syncopal episode.  Patient states since the fall she has been experiencing left hip pain has been unable to walk on the left hip since the fall.  Patient states moderate pain currently much worse with any attempted movement.  No history of hip fracture in the past.  Denies anticoagulation.   Past Medical History:  Diagnosis Date  . GERD (gastroesophageal reflux disease)   . Hypertension   . Thyroid disease     Patient Active Problem List   Diagnosis Date Noted  . Palliative care patient 08/03/2018  . Severe protein-calorie malnutrition (HCC) 07/02/2016  . Impacted cerumen of both ears 03/13/2016  . Hearing reduced, bilateral 03/13/2016  . Late onset Alzheimer's disease with behavioral disturbance (HCC) 11/27/2015  . Elevated hemoglobin A1c 03/28/2015  . GERD (gastroesophageal reflux disease) 03/28/2015  . Hypertension 03/28/2015  . Hypothyroidism 03/28/2015  . Osteoporosis 03/28/2015    History reviewed. No pertinent surgical history.  Prior to Admission medications   Medication Sig Start Date End Date Taking? Authorizing Provider  levothyroxine (SYNTHROID) 100 MCG tablet TAKE 1 TABLET BY MOUTH BEFORE BREAKFAST DAILY 12/24/19   Althea Charon, Netta Neat, DO  melatonin 5 MG TABS TAKE 1 TABLET BY MOUTH DAILY AT BEDTIME 07/14/19   Karamalegos, Netta Neat, DO  Nutritional Supplements (NUTRITIONAL SUPPLEMENT PO) Take 1 tablet by mouth daily. Relora herbal supplement    [provider]  risperiDONE (RISPERDAL) 0.25 MG tablet TAKE 1 TABLET BY MOUTH WITH LUNCH AND ONE WITH DINNER DAILY 09/02/19   Karamalegos, Netta Neat, DO  Valerian Root 450 MG CAPS Take 1 capsule (450 mg total) by mouth daily as needed (anxiety or agitation). 07/22/19   Smitty Cords, DO    Allergies  Allergen Reactions  . Penicillins Hives    Family History  Family history unknown: Yes    Social History Social History   Tobacco Use  . Smoking status: Never Smoker  . Smokeless tobacco: Never Used  Substance Use Topics  . Alcohol use: No  . Drug use: No    Review of Systems Constitutional: Possible brief loss of consciousness per patient.  Positive for dizziness earlier. Cardiovascular: Negative for chest pain. Respiratory: Negative for shortness of breath. Gastrointestinal: Negative for abdominal pain Musculoskeletal: Left hip pain. Neurological: Negative for headache All other ROS negative  ____________________________________________   PHYSICAL EXAM:  VITAL SIGNS: ED Triage Vitals [05/04/20 0855]  Enc Vitals Group     BP (!) 153/71     Pulse Rate 85     Resp 18     Temp 98.4 F (36.9 C)     Temp Source Oral     SpO2 96 %     Weight 145 lb (65.8 kg)     Height 5\' 4"  (1.626 m)     Head Circumference      Peak Flow      Pain Score 5     Pain Loc      Pain Edu?  Excl. in GC?    Constitutional: Alert and oriented. Well appearing and in no distress. Eyes: Normal exam ENT      Head: Normocephalic and atraumatic.      Mouth/Throat: Mucous membranes are moist. Cardiovascular: Normal rate, regular rhythm.  Respiratory: Normal respiratory effort without tachypnea nor retractions. Breath sounds are clear Gastrointestinal: Soft and nontender. No distention. Musculoskeletal: Moderate tenderness to palpation of the left hip.  Increased with any attempted range of motion.  Left hip appears to be externally rotated and somewhat shortened. Neurologic:  Normal  speech and language. No gross focal neurologic deficits Skin:  Skin is warm, dry and intact.  Psychiatric: Mood and affect are normal.   ____________________________________________    EKG  EKG viewed and interpreted by myself shows a normal sinus rhythm 83 bpm with a narrow QRS, normal axis, normal intervals, no concerning ST changes.  ____________________________________________    RADIOLOGY  Left femoral neck fracture on my evaluation of the x-ray. CT scan of the head negative Chest x-ray pending but no acute findings on my evaluation of the image.  ____________________________________________   INITIAL IMPRESSION / ASSESSMENT AND PLAN / ED COURSE  Pertinent labs & imaging results that were available during my care of the patient were reviewed by me and considered in my medical decision making (see chart for details).   Patient presents to the emergency department for left hip pain after a fall after a near syncopal episode.  Overall the patient appears well but does have moderate tenderness to palpation of the left hip and pain with range of motion of the left hip.  Patient's lower extremity is shortened and externally rotated indicating likely fracture.  Patient has poor circulation unable to palpate pulses we will Doppler pulses to ensure no arterial injury.  No obtain x-ray images of the hip to confirm fracture.  Basic labs are largely within normal limits besides leukocytosis which could be due to the pain patient is currently experiencing.  Patient is x-ray confirms left femoral neck fracture.  I spoke to Dr.Poggi of orthopedics who will see the patient.  I spoke to [facility] and they state patient drank a cup of coffee at 7:40 AM this morning no other intake.  We will admit to the hospital service.  Orthopedics possibly will take to the OR today.  Megan Carpenter was evaluated in Emergency Department on 05/04/2020 for the symptoms described in the history of  present illness. She was evaluated in the context of the global COVID-19 pandemic, which necessitated consideration that the patient might be at risk for infection with the SARS-CoV-2 virus that causes COVID-19. Institutional protocols and algorithms that pertain to the evaluation of patients at risk for COVID-19 are in a state of rapid change based on information released by regulatory bodies including the CDC and federal and state organizations. These policies and algorithms were followed during the patient's care in the ED.  ____________________________________________   FINAL CLINICAL IMPRESSION(S) / ED DIAGNOSES  Left hip fracture   Minna Antis, MD 05/04/20 1408

## 2020-05-04 NOTE — ED Notes (Signed)
Alycia Rossetti Technical sales engineer) [phone]

## 2020-05-05 ENCOUNTER — Inpatient Hospital Stay: Payer: Medicare Other

## 2020-05-05 ENCOUNTER — Inpatient Hospital Stay: Payer: Medicare Other | Admitting: Anesthesiology

## 2020-05-05 ENCOUNTER — Encounter
Admission: EM | Disposition: A | Discharge status: Skilled Nursing Facility | Payer: Self-pay | Source: Home / Self Care | Attending: Family Medicine

## 2020-05-05 DIAGNOSIS — S72002A Fracture of unspecified part of neck of left femur, initial encounter for closed fracture: Secondary | ICD-10-CM

## 2020-05-05 HISTORY — PX: HIP ARTHROPLASTY: SHX981

## 2020-05-05 LAB — GLUCOSE, CAPILLARY
Glucose-Capillary: 181 mg/dL — ABNORMAL HIGH (ref 70–99)
Glucose-Capillary: 187 mg/dL — ABNORMAL HIGH (ref 70–99)
Glucose-Capillary: 204 mg/dL — ABNORMAL HIGH (ref 70–99)
Glucose-Capillary: 209 mg/dL — ABNORMAL HIGH (ref 70–99)
Glucose-Capillary: 242 mg/dL — ABNORMAL HIGH (ref 70–99)

## 2020-05-05 LAB — BASIC METABOLIC PANEL
Anion gap: 11 (ref 5–15)
BUN: 16 mg/dL (ref 8–23)
CO2: 25 mmol/L (ref 22–32)
Calcium: 8.8 mg/dL — ABNORMAL LOW (ref 8.9–10.3)
Chloride: 102 mmol/L (ref 98–111)
Creatinine, Ser: 0.65 mg/dL (ref 0.44–1.00)
GFR, Estimated: >60 mL/min (ref >60)
Glucose, Bld: 198 mg/dL — ABNORMAL HIGH (ref 70–99)
Potassium: 4.1 mmol/L (ref 3.5–5.1)
Sodium: 138 mmol/L (ref 135–145)

## 2020-05-05 LAB — VITAMIN D 25 HYDROXY (VIT D DEFICIENCY, FRACTURES): Vit D, 25-Hydroxy: 14.49 ng/mL — ABNORMAL LOW (ref 30–100)

## 2020-05-05 LAB — CBC
HCT: 41.9 % (ref 36.0–46.0)
Hemoglobin: 14.1 g/dL (ref 12.0–15.0)
MCH: 29.7 pg (ref 26.0–34.0)
MCHC: 33.7 g/dL (ref 30.0–36.0)
MCV: 88.2 fL (ref 80.0–100.0)
Platelets: 186 10*3/uL (ref 150–400)
RBC: 4.75 MIL/uL (ref 3.87–5.11)
RDW: 13.1 % (ref 11.5–15.5)
WBC: 17.4 10*3/uL — ABNORMAL HIGH (ref 4.0–10.5)
nRBC: 0 % (ref 0.0–0.2)

## 2020-05-05 LAB — MRSA PCR SCREENING: MRSA by PCR: NEGATIVE

## 2020-05-05 LAB — HEMOGLOBIN A1C
Hgb A1c MFr Bld: 6.4 % — ABNORMAL HIGH (ref 4.8–5.6)
Mean Plasma Glucose: 136.98 mg/dL

## 2020-05-05 SURGERY — HEMIARTHROPLASTY, HIP, DIRECT ANTERIOR APPROACH, FOR FRACTURE
Anesthesia: Spinal | Site: Hip | Laterality: Left

## 2020-05-05 MED ORDER — LIDOCAINE HCL (PF) 2 % IJ SOLN
INTRAMUSCULAR | Status: AC
  Filled 2020-05-05: qty 5

## 2020-05-05 MED ORDER — DOCUSATE SODIUM 100 MG PO CAPS
100.0000 mg | ORAL_CAPSULE | Freq: Two times a day (BID) | ORAL | Status: DC
  Administered 2020-05-05 – 2020-05-12 (×13): 100 mg via ORAL
  Filled 2020-05-05 (×13): qty 1

## 2020-05-05 MED ORDER — ONDANSETRON HCL 4 MG/2ML IJ SOLN: 4.0000 mg | Freq: Once | INTRAMUSCULAR | Status: DC | PRN

## 2020-05-05 MED ORDER — PROPOFOL 500 MG/50ML IV EMUL
INTRAVENOUS | Status: DC | PRN
  Administered 2020-05-05: 50 ug/kg/min via INTRAVENOUS

## 2020-05-05 MED ORDER — BISACODYL 10 MG RE SUPP: 10.0000 mg | Freq: Every day | RECTAL | Status: DC | PRN

## 2020-05-05 MED ORDER — TRANEXAMIC ACID 1000 MG/10ML IV SOLN
INTRAVENOUS | Status: DC | PRN
  Administered 2020-05-05: 1000 mg via TOPICAL

## 2020-05-05 MED ORDER — FENTANYL CITRATE (PF) 100 MCG/2ML IJ SOLN
INTRAMUSCULAR | Status: AC
  Filled 2020-05-05: qty 2

## 2020-05-05 MED ORDER — PROPOFOL 500 MG/50ML IV EMUL
INTRAVENOUS | Status: AC
  Filled 2020-05-05: qty 50

## 2020-05-05 MED ORDER — SODIUM CHLORIDE FLUSH 0.9 % IV SOLN
INTRAVENOUS | Status: AC
  Filled 2020-05-05: qty 10

## 2020-05-05 MED ORDER — ACETAMINOPHEN 500 MG PO TABS
500.0000 mg | ORAL_TABLET | Freq: Four times a day (QID) | ORAL | Status: AC
  Administered 2020-05-05 – 2020-05-06 (×4): 500 mg via ORAL
  Filled 2020-05-05 (×4): qty 1

## 2020-05-05 MED ORDER — FENTANYL CITRATE (PF) 100 MCG/2ML IJ SOLN
INTRAMUSCULAR | Status: DC | PRN
  Administered 2020-05-05 (×2): 25 ug via INTRAVENOUS

## 2020-05-05 MED ORDER — OXYCODONE HCL 5 MG PO TABS: 5.0000 mg | ORAL_TABLET | Freq: Once | ORAL | Status: DC | PRN

## 2020-05-05 MED ORDER — DEXAMETHASONE SODIUM PHOSPHATE 10 MG/ML IJ SOLN
INTRAMUSCULAR | Status: DC | PRN
  Administered 2020-05-05: 5 mg via INTRAVENOUS

## 2020-05-05 MED ORDER — BUPIVACAINE-EPINEPHRINE (PF) 0.5% -1:200000 IJ SOLN
INTRAMUSCULAR | Status: DC | PRN
  Administered 2020-05-05: 30 mL

## 2020-05-05 MED ORDER — CEFAZOLIN SODIUM-DEXTROSE 2-4 GM/100ML-% IV SOLN
2.0000 g | Freq: Four times a day (QID) | INTRAVENOUS | Status: AC
Start: 2020-05-05 — End: 2020-05-06
  Administered 2020-05-05 – 2020-05-06 (×3): 2 g via INTRAVENOUS
  Filled 2020-05-05 (×5): qty 100

## 2020-05-05 MED ORDER — METOCLOPRAMIDE HCL 10 MG PO TABS: 5.0000 mg | ORAL_TABLET | Freq: Three times a day (TID) | ORAL | Status: DC | PRN

## 2020-05-05 MED ORDER — ENOXAPARIN SODIUM 40 MG/0.4ML ~~LOC~~ SOLN
40.0000 mg | SUBCUTANEOUS | Status: DC
  Administered 2020-05-06 – 2020-05-12 (×7): 40 mg via SUBCUTANEOUS
  Filled 2020-05-05 (×7): qty 0.4

## 2020-05-05 MED ORDER — ACETAMINOPHEN 10 MG/ML IV SOLN
INTRAVENOUS | Status: DC | PRN
  Administered 2020-05-05: 1000 mg via INTRAVENOUS

## 2020-05-05 MED ORDER — LIDOCAINE HCL (CARDIAC) PF 100 MG/5ML IV SOSY
PREFILLED_SYRINGE | INTRAVENOUS | Status: DC | PRN
  Administered 2020-05-05: 60 mg via INTRAVENOUS

## 2020-05-05 MED ORDER — BUPIVACAINE HCL (PF) 0.5 % IJ SOLN
INTRAMUSCULAR | Status: DC | PRN
  Administered 2020-05-05: 2.5 mL via INTRATHECAL

## 2020-05-05 MED ORDER — SODIUM CHLORIDE 0.9 % IV SOLN: INTRAVENOUS | Status: DC

## 2020-05-05 MED ORDER — SODIUM CHLORIDE 0.9 % IV SOLN
INTRAVENOUS | Status: DC | PRN
  Administered 2020-05-05: 25 ug/min via INTRAVENOUS

## 2020-05-05 MED ORDER — BUPIVACAINE HCL (PF) 0.5 % IJ SOLN
INTRAMUSCULAR | Status: AC
  Filled 2020-05-05: qty 10

## 2020-05-05 MED ORDER — ADULT MULTIVITAMIN W/MINERALS CH
1.0000 | ORAL_TABLET | Freq: Every day | ORAL | Status: DC
  Administered 2020-05-06 – 2020-05-12 (×7): 1 via ORAL
  Filled 2020-05-05 (×6): qty 1

## 2020-05-05 MED ORDER — ONDANSETRON HCL 4 MG/2ML IJ SOLN: 4.0000 mg | Freq: Four times a day (QID) | INTRAMUSCULAR | Status: DC | PRN

## 2020-05-05 MED ORDER — ONDANSETRON HCL 4 MG/2ML IJ SOLN
INTRAMUSCULAR | Status: AC
  Filled 2020-05-05: qty 2

## 2020-05-05 MED ORDER — PHENYLEPHRINE HCL (PRESSORS) 10 MG/ML IV SOLN
INTRAVENOUS | Status: DC | PRN
  Administered 2020-05-05: 100 ug via INTRAVENOUS

## 2020-05-05 MED ORDER — ACETAMINOPHEN 10 MG/ML IV SOLN
INTRAVENOUS | Status: AC
  Filled 2020-05-05: qty 100

## 2020-05-05 MED ORDER — PROPOFOL 10 MG/ML IV BOLUS
INTRAVENOUS | Status: DC | PRN
  Administered 2020-05-05: 20 mg via INTRAVENOUS

## 2020-05-05 MED ORDER — PHENYLEPHRINE HCL (PRESSORS) 10 MG/ML IV SOLN
INTRAVENOUS | Status: AC
  Filled 2020-05-05: qty 1

## 2020-05-05 MED ORDER — ACETAMINOPHEN 325 MG PO TABS
325.0000 mg | ORAL_TABLET | Freq: Four times a day (QID) | ORAL | Status: DC | PRN
  Administered 2020-05-07 – 2020-05-10 (×3): 650 mg via ORAL
  Filled 2020-05-05 (×3): qty 2

## 2020-05-05 MED ORDER — BUPIVACAINE LIPOSOME 1.3 % IJ SUSP
INTRAMUSCULAR | Status: DC | PRN
  Administered 2020-05-05: 20 mL

## 2020-05-05 MED ORDER — METOCLOPRAMIDE HCL 5 MG/ML IJ SOLN: 5.0000 mg | Freq: Three times a day (TID) | INTRAMUSCULAR | Status: DC | PRN

## 2020-05-05 MED ORDER — FLEET ENEMA 7-19 GM/118ML RE ENEM: 1.0000 | ENEMA | Freq: Once | RECTAL | Status: DC | PRN

## 2020-05-05 MED ORDER — OXYCODONE HCL 5 MG/5ML PO SOLN: 5.0000 mg | Freq: Once | ORAL | Status: DC | PRN

## 2020-05-05 MED ORDER — TRAMADOL HCL 50 MG PO TABS
50.0000 mg | ORAL_TABLET | Freq: Four times a day (QID) | ORAL | Status: DC | PRN
  Administered 2020-05-05 – 2020-05-06 (×2): 50 mg via ORAL
  Filled 2020-05-05 (×2): qty 1

## 2020-05-05 MED ORDER — ENSURE ENLIVE PO LIQD
237.0000 mL | Freq: Two times a day (BID) | ORAL | Status: DC
  Administered 2020-05-06 – 2020-05-09 (×8): 237 mL via ORAL
  Administered 2020-05-10: 100 mL via ORAL
  Administered 2020-05-10 – 2020-05-12 (×5): 237 mL via ORAL

## 2020-05-05 MED ORDER — ONDANSETRON HCL 4 MG/2ML IJ SOLN
INTRAMUSCULAR | Status: DC | PRN
  Administered 2020-05-05: 4 mg via INTRAVENOUS

## 2020-05-05 MED ORDER — MAGNESIUM HYDROXIDE 400 MG/5ML PO SUSP
30.0000 mL | Freq: Every day | ORAL | Status: DC | PRN
  Administered 2020-05-06 – 2020-05-09 (×2): 30 mL via ORAL
  Filled 2020-05-05 (×2): qty 30

## 2020-05-05 MED ORDER — ONDANSETRON HCL 4 MG PO TABS
4.0000 mg | ORAL_TABLET | Freq: Four times a day (QID) | ORAL | Status: DC | PRN
  Filled 2020-05-05: qty 1

## 2020-05-05 MED ORDER — FENTANYL CITRATE (PF) 100 MCG/2ML IJ SOLN: 25.0000 ug | INTRAMUSCULAR | Status: DC | PRN

## 2020-05-05 MED ORDER — DIPHENHYDRAMINE HCL 12.5 MG/5ML PO ELIX
12.5000 mg | ORAL_SOLUTION | ORAL | Status: DC | PRN
  Administered 2020-05-06: 12.5 mg via ORAL
  Filled 2020-05-05: qty 5

## 2020-05-05 SURGICAL SUPPLY — 65 items
BAG DECANTER FOR FLEXI CONT (MISCELLANEOUS) IMPLANT
BLADE SAGITTAL WIDE XTHICK NO (BLADE) ×2 IMPLANT
BLADE SURG SZ20 CARB STEEL (BLADE) ×2 IMPLANT
BNDG COHESIVE 6X5 TAN STRL LF (GAUZE/BANDAGES/DRESSINGS) ×2 IMPLANT
BOWL CEMENT MIXING ADV NOZZLE (MISCELLANEOUS) IMPLANT
CANISTER SUCT 1200ML W/VALVE (MISCELLANEOUS) ×2 IMPLANT
CANISTER SUCT 3000ML PPV (MISCELLANEOUS) ×4 IMPLANT
CHLORAPREP W/TINT 26 (MISCELLANEOUS) ×4 IMPLANT
COVER WAND RF STERILE (DRAPES) ×2 IMPLANT
DECANTER SPIKE VIAL GLASS SM (MISCELLANEOUS) ×4 IMPLANT
DRAPE 3/4 80X56 (DRAPES) ×2 IMPLANT
DRAPE INCISE IOBAN 66X60 STRL (DRAPES) ×2 IMPLANT
DRAPE SPLIT 6X30 W/TAPE (DRAPES) ×4 IMPLANT
DRAPE SURG 17X11 SM STRL (DRAPES) ×2 IMPLANT
DRAPE SURG 17X23 STRL (DRAPES) ×2 IMPLANT
DRSG OPSITE POSTOP 4X12 (GAUZE/BANDAGES/DRESSINGS) ×2 IMPLANT
DRSG OPSITE POSTOP 4X14 (GAUZE/BANDAGES/DRESSINGS) IMPLANT
DRSG OPSITE POSTOP 4X8 (GAUZE/BANDAGES/DRESSINGS) ×2 IMPLANT
ELECT BLADE 6.5 EXT (BLADE) ×2 IMPLANT
ELECT CAUTERY BLADE 6.4 (BLADE) ×2 IMPLANT
ELECT REM PT RETURN 9FT ADLT (ELECTROSURGICAL) ×2
ELECTRODE REM PT RTRN 9FT ADLT (ELECTROSURGICAL) ×1 IMPLANT
GAUZE PACK 2X3YD (PACKING) IMPLANT
GLOVE BIO SURGEON STRL SZ8 (GLOVE) ×6 IMPLANT
GLOVE INDICATOR 8.0 STRL GRN (GLOVE) ×2 IMPLANT
GLOVE SRG 8 PF TXTR STRL LF DI (GLOVE) IMPLANT
GLOVE SURG ENC TEXT LTX SZ7.5 (GLOVE) IMPLANT
GLOVE SURG UNDER POLY LF SZ8 (GLOVE)
GOWN STRL REUS W/ TWL LRG LVL3 (GOWN DISPOSABLE) ×1 IMPLANT
GOWN STRL REUS W/ TWL XL LVL3 (GOWN DISPOSABLE) ×1 IMPLANT
GOWN STRL REUS W/TWL LRG LVL3 (GOWN DISPOSABLE) ×1
GOWN STRL REUS W/TWL XL LVL3 (GOWN DISPOSABLE) ×1
HEAD ENDO II MOD SZ 44 (Orthopedic Implant) ×2 IMPLANT
HOOD PEEL AWAY FLYTE STAYCOOL (MISCELLANEOUS) ×4 IMPLANT
INSERT TAPER ENDO II -3 (Orthopedic Implant) ×2 IMPLANT
IV NS 100ML SINGLE PACK (IV SOLUTION) IMPLANT
LABEL OR SOLS (LABEL) ×2 IMPLANT
MANIFOLD NEPTUNE II (INSTRUMENTS) ×2 IMPLANT
NDL SAFETY ECLIPSE 18X1.5 (NEEDLE) ×1 IMPLANT
NEEDLE FILTER BLUNT 18X 1/2SAF (NEEDLE) ×1
NEEDLE FILTER BLUNT 18X1 1/2 (NEEDLE) ×1 IMPLANT
NEEDLE HYPO 18GX1.5 SHARP (NEEDLE) ×1
NEEDLE SPNL 20GX3.5 QUINCKE YW (NEEDLE) ×2 IMPLANT
NS IRRIG 1000ML POUR BTL (IV SOLUTION) ×2 IMPLANT
PACK HIP PROSTHESIS (MISCELLANEOUS) ×2 IMPLANT
PULSAVAC PLUS IRRIG FAN TIP (DISPOSABLE) ×2
SOL .9 NS 3000ML IRR  AL (IV SOLUTION) ×2
SOL .9 NS 3000ML IRR UROMATIC (IV SOLUTION) ×2 IMPLANT
STAPLER SKIN PROX 35W (STAPLE) ×2 IMPLANT
STEM COLLARLESS RED 8X120X135 (Stem) ×2 IMPLANT
STEM FEMORAL 9X15MM HIP 130D (Stem) ×2 IMPLANT
STRAP SAFETY 5IN WIDE (MISCELLANEOUS) ×2 IMPLANT
SUT ETHIBOND 2 V 37 (SUTURE) ×2 IMPLANT
SUT VIC AB 1 CT1 36 (SUTURE) IMPLANT
SUT VIC AB 2-0 CT1 (SUTURE) ×4 IMPLANT
SUT VIC AB 2-0 CT1 27 (SUTURE)
SUT VIC AB 2-0 CT1 TAPERPNT 27 (SUTURE) IMPLANT
SUT VICRYL 1-0 27IN ABS (SUTURE) ×4
SUTURE VICRYL 1-0 27IN ABS (SUTURE) ×2 IMPLANT
SYR 10ML LL (SYRINGE) ×2 IMPLANT
SYR 30ML LL (SYRINGE) ×6 IMPLANT
SYR TB 1ML 27GX1/2 LL (SYRINGE) IMPLANT
TAPE TRANSPORE STRL 2 31045 (GAUZE/BANDAGES/DRESSINGS) ×2 IMPLANT
TIP BRUSH PULSAVAC PLUS 24.33 (MISCELLANEOUS) IMPLANT
TIP FAN IRRIG PULSAVAC PLUS (DISPOSABLE) ×1 IMPLANT

## 2020-05-05 NOTE — Progress Notes (Signed)
OT Cancellation Note  Patient Details Name: Megan Carpenter MRN: [id number] DOB: 06/19/1928   Cancelled Treatment:    Reason Eval/Treat Not Completed: Patient at procedure or test/ unavailable  OT consult received and chart reviewed. Pt's planned orthopedic procedure was postponed from 05/04/20 to today, 05/05/20. Pt OTF at procedure at this time. Will f/u for OT evaluation at later date/time as appropriate/available. Thank you.  Rejeana Brock, MS, OTR/L ascom [phone] 05/05/20, 11:48 AM

## 2020-05-05 NOTE — Progress Notes (Signed)
PROGRESS NOTE    Megan Carpenter  DGL:[id number] DOB: 03/13/1929 DOA: 05/04/2020 PCP: Smitty CordsKaramalegos, Alexander J, DO   Brief Narrative:  This 87 years old female with PMH significant for GERD, hypertension, hypothyroidism, chronic dementia,  Alzheimer's type, progressive cognitive decline with history of sundowning on risperidone 0.25 mg was sent from nursing home status post fall. History is obtained from daughter who reports after she called the facility and she was told that her mom has slipped in the bathroom and had a left hip pain. She denies any LOC. X-ray hip shows displaced left femoral neck fracture. Ortho visit consulted,  Patient underwent left hip hemiarthroplasty tolerated well.   Assessment & Plan:   Active Problems:   Hip fx (HCC)  Displaced subcapital fracture of the left femoral neck: Patient presented status post fall in a nursing home. X-ray hip shows displaced subcapital fracture of left femoral neck. Continue hip fracture protocol. Orthopedic consulted, status post left hip hemiarthroplasty postoperative day one. She reports pain is controlled.  Adequate pain control PT and OT evaluation.   Acute Sinusitis  noted on CT scan  Continue Z-Pak.  Hyperglycemia Obtain hemoglobin A1c,  monitor fingersticks  GERD Stable.  HTN: Blood pressure remains elevated because of pain. Hydralazine as needed  Hypothyroidism Continue synthroid  Chronic dementia, Alzheimer's type progressive decline with history of sundowning  Continue  Risperidone 0.25mg   Per last pcp note for behavioral disturbances and sleep    DVT prophylaxis: SCDs Code Status: Full code Family Communication:  No family at bed side. Disposition Plan:   Status is: Inpatient  Remains inpatient appropriate because:Inpatient level of care appropriate due to severity of illness   Dispo: The patient is from: SNF              Anticipated d/c is to: SNF              Anticipated d/c date  is: > 3 days              Patient currently is not medically stable to d/c.   Consultants:   Orthopedics  Procedures: Left hip hemiarthroplasty.    Antimicrobials:  Anti-infectives (From admission, onward)   Start     Dose/Rate Route Frequency Ordered Stop   05/05/20 1500  ceFAZolin (ANCEF) IVPB 2g/100 mL premix        2 g 200 mL/hr over 30 Minutes Intravenous Every 6 hours 05/05/20 1318 05/06/20 0859   05/05/20 0600  ceFAZolin (ANCEF) IVPB 2g/100 mL premix        2 g 200 mL/hr over 30 Minutes Intravenous 30 min pre-op 05/04/20 1440 05/05/20 0957   05/04/20 1930  azithromycin (ZITHROMAX) tablet 500 mg        500 mg Oral Daily 05/04/20 1930 05/09/20 0959   05/04/20 1402  ceFAZolin (ANCEF) IVPB 2g/100 mL premix  Status:  Discontinued        2 g 200 mL/hr over 30 Minutes Intravenous 30 min pre-op 05/04/20 1404 05/04/20 1440      Subjective: Patient was seen and examined at bedside. Overnight events noted. Patient is  s/p left hip hemiarthroplasty, tolerated well.  she reports still having pain but feels better  Objective: Vitals:   05/05/20 1205 05/05/20 1222 05/05/20 1310 05/05/20 1354  BP: (!) 109/94 111/75 135/72 131/68  Pulse: 82 77 78 77  Resp: 19 16 18 16   Temp: 98.1 F (36.7 C) 98.5 F (36.9 C) (!) 97.5 F (36.4 C) 99.3 F (37.4 C)  TempSrc:  Oral Oral Oral  SpO2: 92% 99% 98% 98%  Weight:      Height:        Intake/Output Summary (Last 24 hours) at 05/05/2020 1419 Last data filed at 05/05/2020 1104 Gross per 24 hour  Intake 900 ml  Output 125 ml  Net 775 ml   Filed Weights   05/04/20 0855  Weight: 65.8 kg    Examination:  General exam: Appears calm and comfortable, not in any acute distress Respiratory system: Clear to auscultation. Respiratory effort normal. Cardiovascular system: S1 & S2 heard, RRR. No JVD, murmurs, rubs, gallops or clicks. No pedal edema. Gastrointestinal system: Abdomen is nondistended, soft and nontender. No organomegaly or  masses felt. Normal bowel sounds heard. Central nervous system: Alert and oriented. No focal neurological deficits. Extremities: No edema, no cyanosis, no clubbing.  Left hip tenderness Skin: No rashes, lesions or ulcers Psychiatry: Judgement and insight appear normal. Mood & affect appropriate.     Data Reviewed: I have personally reviewed following labs and imaging studies  CBC: Recent Labs  Lab 05/04/20 0900 05/05/20 0329  WBC 18.5* 17.4*  HGB 14.7 14.1  HCT 45.5 41.9  MCV 90.8 88.2  PLT 194 186   Basic Metabolic Panel: Recent Labs  Lab 05/04/20 0900 05/05/20 0329  NA 138 138  K 4.3 4.1  CL 102 102  CO2 24 25  GLUCOSE 235* 198*  BUN 20 16  CREATININE 0.92 0.65  CALCIUM 9.1 8.8*   GFR: Estimated Creatinine Clearance: 44.5 mL/min (by C-G formula based on SCr of 0.65 mg/dL). Liver Function Tests: No results for input(s): AST, ALT, ALKPHOS, BILITOT, PROT, ALBUMIN in the last 168 hours. No results for input(s): LIPASE, AMYLASE in the last 168 hours. No results for input(s): AMMONIA in the last 168 hours. Coagulation Profile: No results for input(s): INR, PROTIME in the last 168 hours. Cardiac Enzymes: No results for input(s): CKTOTAL, CKMB, CKMBINDEX, TROPONINI in the last 168 hours. BNP (last 3 results) No results for input(s): PROBNP in the last 8760 hours. HbA1C: Recent Labs    05/04/20 2107  HGBA1C 6.4*   CBG: Recent Labs  Lab 05/05/20 0004 05/05/20 0754 05/05/20 0850  GLUCAP 209* 204* 187*   Lipid Profile: No results for input(s): CHOL, HDL, LDLCALC, TRIG, CHOLHDL, LDLDIRECT in the last 72 hours. Thyroid Function Tests: Recent Labs    05/04/20 2107  TSH 3.829   Anemia Panel: No results for input(s): VITAMINB12, FOLATE, FERRITIN, TIBC, IRON, RETICCTPCT in the last 72 hours. Sepsis Labs: No results for input(s): PROCALCITON, LATICACIDVEN in the last 168 hours.  Recent Results (from the past 240 hour(s))  SARS Coronavirus 2 by RT PCR  (hospital order, performed in [facility] hospital lab) Nasopharyngeal Nasopharyngeal Swab     Status: None   Collection Time: 05/04/20  2:09 PM   Specimen: Nasopharyngeal Swab  Result Value Ref Range Status   SARS Coronavirus 2 NEGATIVE NEGATIVE Final    Comment: (NOTE) SARS-CoV-2 target nucleic acids are NOT DETECTED.  The SARS-CoV-2 RNA is generally detectable in upper and lower respiratory specimens during the acute phase of infection. The lowest concentration of SARS-CoV-2 viral copies this assay can detect is 250 copies / mL. A negative result does not preclude SARS-CoV-2 infection and should not be used as the sole basis for treatment or other patient management decisions.  A negative result may occur with improper specimen collection / handling, submission of specimen other than nasopharyngeal swab, presence of viral mutation(s) within  the areas targeted by this assay, and inadequate number of viral copies (<250 copies / mL). A negative result must be combined with clinical observations, patient history, and epidemiological information.  Fact Sheet for Patients:   BoilerBrush.com.cy  Fact Sheet for Healthcare Providers: https://pope.com/  This test is not yet approved or  cleared by the [place] FDA and has been authorized for detection and/or diagnosis of SARS-CoV-2 by FDA under an Emergency Use Authorization (EUA).  This EUA will remain in effect (meaning this test can be used) for the duration of the COVID-19 declaration under Section 564(b)(1) of the Act, 21 U.S.C. section 360bbb-3(b)(1), unless the authorization is terminated or revoked sooner.  Performed at [facility], [address] [place], [place] [address]   MRSA PCR Screening     Status: None   Collection Time: 05/05/20  1:53 AM   Specimen: Nasopharyngeal  Result Value Ref Range Status   MRSA by PCR NEGATIVE NEGATIVE Final    Comment:         The GeneXpert MRSA Assay (FDA approved for NASAL specimens only), is one component of a comprehensive MRSA colonization surveillance program. It is not intended to diagnose MRSA infection nor to guide or monitor treatment for MRSA infections. Performed at [facility], [address], [place], [place] [address]          Radiology Studies: DG Chest 1 View  Result Date: 05/04/2020 CLINICAL DATA:  Preop.  History of hypertension. EXAM: CHEST  1 VIEW COMPARISON:  12/24/2007 FINDINGS: Cardiac silhouette is normal in size. No mediastinal or hilar masses. Lungs demonstrate prominent bronchovascular markings bilaterally. No evidence of pneumonia or pulmonary edema. No pleural effusion or pneumothorax. Skeletal structures are demineralized but grossly intact. IMPRESSION: No acute cardiopulmonary disease. Electronically Signed   By: Amie Portland M.D.   On: 05/04/2020 14:05   CT Head Wo Contrast  Result Date: 05/04/2020 CLINICAL DATA:  Pain following fall EXAM: CT HEAD WITHOUT CONTRAST TECHNIQUE: Contiguous axial images were obtained from the base of the skull through the vertex without intravenous contrast. COMPARISON:  November 28, 2018 FINDINGS: Brain: Ventricles and sulci are normal in size and configuration for age. There is no intracranial mass, hemorrhage, extra-axial fluid collection, or midline shift. There is stable patchy small vessel disease in the centra semiovale bilaterally. No evident acute infarct. Vascular: No hyperdense vessel. There is calcification in each carotid siphon region. Skull: Bony calvarium appears intact. Sinuses/Orbits: Mucosal thickening noted in several ethmoid air cells. Other visualized paranasal sinuses are clear. Orbits appear symmetric bilaterally. Other: There is airspace opacity in multiple mastoid air cells bilaterally. IMPRESSION: Patchy periventricular small vessel disease, stable. No acute infarct. No mass or hemorrhage. There are foci of arterial  vascular calcification. There is mucosal thickening in several ethmoid air cells. There are scattered areas of airspace opacity in multiple mastoid air cells bilaterally. Electronically Signed   By: Bretta Bang III M.D.   On: 05/04/2020 13:39   DG HIP UNILAT W OR W/O PELVIS 2-3 VIEWS LEFT  Result Date: 05/05/2020 CLINICAL DATA:  Status post total hip replacement on the left EXAM: DG HIP (WITH OR WITHOUT PELVIS) 2-3V LEFT COMPARISON:  May 04, 2020. FINDINGS: Frontal pelvis and lateral left hip images were obtained. Patient is status post total hip replacement on the left with prosthetic components well-seated. No acute fracture or dislocation. Moderate narrowing right hip joint. IMPRESSION: Total hip replacement on the left with prosthetic components well-seated. No acute fracture or dislocation.  Narrowing right hip joint. Electronically Signed   By: Bretta Bang III M.D.   On: 05/05/2020 12:12   DG HIP UNILAT WITH PELVIS 2-3 VIEWS LEFT  Result Date: 05/04/2020 CLINICAL DATA:  Fall.  Left hip pain. EXAM: DG HIP (WITH OR WITHOUT PELVIS) 2-3V LEFT COMPARISON:  None. FINDINGS: Displaced subcapital fracture of the left femoral neck. Distal fracture component has displaced superiorly by 2.5 cm. There is varus angulation between femoral head distal fracture component. No other fractures.  No bone lesions. Hip joints, SI joints and symphysis pubis are normally aligned. Soft tissues are unremarkable. IMPRESSION: 1. Displaced subcapital left femoral neck fracture with varus angulation. No significant comminution. No dislocation Electronically Signed   By: Amie Portland M.D.   On: 05/04/2020 14:06        Scheduled Meds: . acetaminophen  500 mg Oral Q6H  . azithromycin  500 mg Oral Daily  . docusate sodium  100 mg Oral BID  . [START ON 05/06/2020] enoxaparin (LOVENOX) injection  40 mg Subcutaneous Q24H  . [START ON 05/06/2020] feeding supplement  237 mL Oral BID BM  . levothyroxine  100 mcg  Oral Q0600  . [START ON 05/06/2020] multivitamin with minerals  1 tablet Oral Daily  . risperiDONE  0.25 mg Oral QHS  . sodium chloride flush       Continuous Infusions: . sodium chloride    .  ceFAZolin (ANCEF) IV       LOS: 1 day    Time spent: 35 mins    Zoila Ditullio, MD [facility]   If 7PM-7AM, please contact night-coverage

## 2020-05-05 NOTE — Op Note (Signed)
05/05/2020  11:11 AM  Patient:   Megan Carpenter  Pre-Op Diagnosis:   Displaced femoral neck fracture, left hip.  Post-Op Diagnosis:   Same.  Procedure:   Left hip unipolar hemiarthroplasty.  Surgeon:   Maryagnes Amos, MD  Assistant:   Horris Latino, PA-C  Anesthesia:   Spinal  Findings:   As above.  Complications:   None  EBL:   125 cc  Fluids:   600 cc crystalloid  UOP:   None  TT:   None  Drains:   None  Closure:   Staples  Implants:   Biomet press-fit system with a #9 laterally offset reduced proximal profile Echo femoral stem, a 44 mm outer diameter shell, and a -3 mm neck adapter.  Brief Clinical Note:   The patient is an 87 year old female who sustained the above-noted injury yesterday morning when she fell in her room at an assisted living facility. The patient presented to the emergency room where x-rays demonstrated the above-noted injury. She has been cleared medically and presents at this time for definitive management of the injury.  Procedure:   The patient was brought into the operating room. After adequate spinal anesthesia was obtained, the patient was repositioned in the right lateral decubitus position and secured using a lateral hip positioner. The left hip and lower extremity were prepped with ChloroPrep solution before being draped sterilely. Preoperative antibiotics were administered. A timeout was performed to verify the appropriate surgical site.    A standard posterior approach to the hip was made through an approximately 4-5 inch incision. The incision was carried down through the subcutaneous tissues to expose the gluteal fascia and proximal end of the iliotibial band. These structures were split the length of the incision and the Charnley self-retaining hip retractor placed. The bursal tissues were swept posteriorly to expose the short external rotators. The anterior border of the piriformis tendon was identified and this plane developed down  through the capsule to enter the joint. Abundant fracture hematoma was suctioned. A flap of tissue was elevated off the posterior aspect of the femoral neck and greater trochanter and retracted posteriorly. This flap included the piriformis tendon, the short external rotators, and the posterior capsule. The femoral head was removed in its entirety, then taken to the back table where it was measured and found to be optimally replicated by a 44 mm head. The appropriate trial head was inserted and found to demonstrate an excellent suction fit.   Attention was directed to the femoral side. The femoral neck was recut 10-12 mm above the lesser trochanter using an oscillating saw. The piriformis fossa was debrided of soft tissues before the intramedullary canal was accessed through this point using a tapered reamer. A box osteotome was used to establish version before the canal was broached sequentially beginning with a #7 broach and progressing to a #8 broach. This broach appeared to fit quite tightly so it was left in place and several trial reductions performed. The permanent #8 standard offset reduced proximal profile femoral stem was impacted into place. A repeat trial reduction was performed using both the -6 mm and -3 mm neck lengths. The -3 mm neck length demonstrated excellent stability both in extension and external rotation as well as with flexion to 90 and internal rotation beyond 70. It also was stable in the position of sleep. The forty-four mm outer diameter shell with the -3 mm neck adapter construct was put together on the back table before being impacted  onto the stem of the femoral component.  Again the hip was placed through a range of motion.    This time, however, the hip appeared to be somewhat unstable in flexion and internal rotation. Evidently, while impacting the head onto the stem, it countersink the stem further into the femoral canal. Therefore, this stem was removed and it was elected  to proceed with a #9 laterally offset reduced proximal profile stem. This stem was impacted into place with care taken to maintain the appropriate version. The same outer diameter shell with the -3 mm neck adapter construct was impacted into place. The Morse taper locking mechanism was verified using manual distraction before the head was relocated and the hip placed through a range of motion. This time, the hip again demonstrated excellent stability both in extension and external rotation, as well as with flexion to 90 degrees and internal rotation beyond 70 degrees. It also was stable in the position of sleep.  The wound was copiously irrigated with bacitracin saline solution via the jet lavage system before the peri-incisional and pericapsular tissues were injected with 30 cc of 0.5% Sensorcaine with epinephrine and 20 cc of Exparel diluted out to 60 cc with normal saline to help with postoperative analgesia. The posterior flap was reapproximated to the posterior aspect of the greater trochanter using #2 Ethibond interrupted sutures placed through drill holes. The iliotibial band was reapproximated using #1 Vicryl interrupted sutures before the gluteal fascia was closed using a running #0 Vicryl suture. At this point, 1 g of transexemic acid in 10 cc of normal saline was injected into the joint to help reduce postoperative bleeding. The subcutaneous tissues were closed in several layers using 2-0 Vicryl interrupted sutures before the skin was closed using staples. A sterile occlusive dressing was applied to the wound . The patient then was rolled back into the supine position on the hospital bed before being awakened and returned to the recovery room in satisfactory condition after tolerating the procedure well.

## 2020-05-05 NOTE — Progress Notes (Signed)
Initial Nutrition Assessment  DOCUMENTATION CODES:   Not applicable  INTERVENTION:  Once diet is advanced provide: -Ensure Enlive po BID, each supplement provides 350 kcal and 20 grams of protein -MVI po daily  NUTRITION DIAGNOSIS:   Increased nutrient needs related to post-op healing as evidenced by estimated needs.  GOAL:   Patient will meet greater than or equal to 90% of their needs  MONITOR:   Diet advancement,PO intake,Supplement acceptance,Labs,Weight trends,I & O's  REASON FOR ASSESSMENT:   Consult Assessment of nutrition requirement/status  ASSESSMENT:   87 year old female with PMHx of HTN, GERD, hypothyroidism, Alzheimer's dementia admitted with displaced subcapital fracture of left femoral neck, acute sinusitis.   Unable to meet with patient as she was in the OR. Per review of chart patient presented from [facility]. Will monitor for diet advancement after surgery. Patient would benefit from oral nutrition supplements for post-operative healing.  Per review of weight history in chart patient was 40.8 kg on 08/03/2018, 45.4 kg on 11/28/2018, 45.2 kg on 06/09/2019, and is currently documented to be 65.8 kg (145 lbs). Unsure if current weight is accurate.   Medications reviewed and include: azithromycin, levothyroxine, NS at 50 mL/hr.  Labs reviewed: CBG 187-209.  Unable to determine if patient meets criteria for malnutrition at this time.  NUTRITION - FOCUSED PHYSICAL EXAM:  Unable to complete at this time.  Diet Order:   Diet Order            Diet NPO time specified  Diet effective midnight                EDUCATION NEEDS:   No education needs have been identified at this time  Skin:  Skin Assessment: Skin Integrity Issues: Skin Integrity Issues:: Incisions Incisions: closed incision left hip  Last BM:  05/05/2020 large type 6  Height:   Ht Readings from Last 1 Encounters:  05/04/20 5\' 4"  (1.626 m)   Weight:   Wt Readings  from Last 1 Encounters:  05/04/20 65.8 kg   BMI:  Body mass index is 24.89 kg/m.  Estimated Nutritional Needs:   Kcal:  1700-1900  Protein:  85-95 grams  Fluid:  1.7-1.9 L/day  05/06/20, MS, RD, LDN Pager number available on Amion

## 2020-05-05 NOTE — Anesthesia Preprocedure Evaluation (Signed)
Anesthesia Evaluation  Patient identified by MRN, date of birth, ID band Patient awake    Reviewed: Allergy & Precautions, H&P , NPO status , Patient's Chart, lab work & pertinent test results  History of Anesthesia Complications Negative for: history of anesthetic complications  Airway Mallampati: II  TM Distance: <3 FB     Dental  (+) Chipped, Missing   Pulmonary neg pulmonary ROS, neg sleep apnea, neg COPD,    breath sounds clear to auscultation       Cardiovascular hypertension, (-) angina(-) Past MI and (-) Cardiac Stents (-) dysrhythmias  Rhythm:regular Rate:Normal     Neuro/Psych PSYCHIATRIC DISORDERS Dementia Dementia    GI/Hepatic Neg liver ROS, GERD  Controlled,  Endo/Other  Hypothyroidism   Renal/GU      Musculoskeletal   Abdominal   Peds  Hematology negative hematology ROS (+)   Anesthesia Other Findings Past Medical History: No date: GERD (gastroesophageal reflux disease) No date: Hypertension No date: Thyroid disease  History reviewed. No pertinent surgical history.  BMI    Body Mass Index: 24.89 kg/m      Reproductive/Obstetrics negative OB ROS                            Anesthesia Physical Anesthesia Plan  ASA: II  Anesthesia Plan: Spinal   Post-op Pain Management:    Induction:   PONV Risk Score and Plan: Propofol infusion and Ondansetron  Airway Management Planned: Simple Face Mask  Additional Equipment:   Intra-op Plan:   Post-operative Plan:   Informed Consent: I have reviewed the patients History and Physical, chart, labs and discussed the procedure including the risks, benefits and alternatives for the proposed anesthesia with the patient or authorized representative who has indicated his/her understanding and acceptance.     Dental Advisory Given  Plan Discussed with: Anesthesiologist, CRNA and Surgeon  Anesthesia Plan Comments:         Anesthesia Quick Evaluation

## 2020-05-05 NOTE — Transfer of Care (Signed)
Immediate Anesthesia Transfer of Care Note  Patient: Megan Carpenter  Procedure(s) Performed: ARTHROPLASTY BIPOLAR HIP (HEMIARTHROPLASTY) (Left Hip)  Patient Location: PACU  Anesthesia Type:Spinal  Level of Consciousness: sedated  Airway & Oxygen Therapy: Patient Spontanous Breathing and Patient connected to face mask oxygen  Post-op Assessment: Report given to RN and Post -op Vital signs reviewed and stable  Post vital signs: Reviewed  Last Vitals:  Vitals Value Taken Time  BP    Temp    Pulse    Resp    SpO2      Last Pain:  Vitals:   05/05/20 0814  TempSrc: Oral  PainSc:          Complications: No complications documented.

## 2020-05-05 NOTE — Progress Notes (Signed)
PT Cancellation Note  Patient Details Name: AERIELLE STOKLOSA MRN: [id number] DOB: Sep 20, 1928   Cancelled Treatment:    Reason Eval/Treat Not Completed: Patient at procedure or test/unavailable: Planned orthopedic surgery postponed from 05/04/20 to 05/05/20. Will attempt to see pt at a future date/time as medically appropriate.     Ovidio Hanger PT, DPT 05/05/20, 8:50 AM

## 2020-05-06 LAB — GLUCOSE, CAPILLARY
Glucose-Capillary: 161 mg/dL — ABNORMAL HIGH (ref 70–99)
Glucose-Capillary: 203 mg/dL — ABNORMAL HIGH (ref 70–99)

## 2020-05-06 LAB — CBC
HCT: 37.9 % (ref 36.0–46.0)
Hemoglobin: 12.7 g/dL (ref 12.0–15.0)
MCH: 29.9 pg (ref 26.0–34.0)
MCHC: 33.5 g/dL (ref 30.0–36.0)
MCV: 89.2 fL (ref 80.0–100.0)
Platelets: 164 10*3/uL (ref 150–400)
RBC: 4.25 MIL/uL (ref 3.87–5.11)
RDW: 13.5 % (ref 11.5–15.5)
WBC: 17.5 10*3/uL — ABNORMAL HIGH (ref 4.0–10.5)
nRBC: 0 % (ref 0.0–0.2)

## 2020-05-06 LAB — MAGNESIUM: Magnesium: 2 mg/dL (ref 1.7–2.4)

## 2020-05-06 LAB — BASIC METABOLIC PANEL
Anion gap: 7 (ref 5–15)
BUN: 19 mg/dL (ref 8–23)
CO2: 23 mmol/L (ref 22–32)
Calcium: 8.1 mg/dL — ABNORMAL LOW (ref 8.9–10.3)
Chloride: 108 mmol/L (ref 98–111)
Creatinine, Ser: 0.61 mg/dL (ref 0.44–1.00)
GFR, Estimated: >60 mL/min (ref >60)
Glucose, Bld: 162 mg/dL — ABNORMAL HIGH (ref 70–99)
Potassium: 4.1 mmol/L (ref 3.5–5.1)
Sodium: 138 mmol/L (ref 135–145)

## 2020-05-06 LAB — PHOSPHORUS: Phosphorus: 2.1 mg/dL — ABNORMAL LOW (ref 2.5–4.6)

## 2020-05-06 MED ORDER — K PHOS MONO-SOD PHOS DI & MONO 155-852-130 MG PO TABS
250.0000 mg | ORAL_TABLET | Freq: Three times a day (TID) | ORAL | Status: AC
  Administered 2020-05-06 – 2020-05-07 (×5): 250 mg via ORAL
  Filled 2020-05-06 (×6): qty 1

## 2020-05-06 MED ORDER — TRAMADOL HCL 50 MG PO TABS: 50.0000 mg | ORAL_TABLET | Freq: Four times a day (QID) | ORAL | 0 refills | Status: DC | PRN

## 2020-05-06 MED ORDER — RISPERIDONE 0.5 MG PO TABS
0.5000 mg | ORAL_TABLET | Freq: Two times a day (BID) | ORAL | Status: DC
  Administered 2020-05-06 – 2020-05-12 (×13): 0.5 mg via ORAL
  Filled 2020-05-06 (×14): qty 1

## 2020-05-06 MED ORDER — ENOXAPARIN SODIUM 40 MG/0.4ML ~~LOC~~ SOLN: 40.0000 mg | SUBCUTANEOUS | 0 refills | Status: DC

## 2020-05-06 MED ORDER — HYDROCODONE-ACETAMINOPHEN 5-325 MG PO TABS: 1.0000 | ORAL_TABLET | Freq: Four times a day (QID) | ORAL | 0 refills | Status: DC | PRN

## 2020-05-06 NOTE — Progress Notes (Addendum)
PROGRESS NOTE    Megan Carpenter  GNF:[id number] DOB: [birth year] DOA: 05/04/2020 PCP: Smitty Cords, DO   Brief Narrative:  This 87 years old female with PMH significant for GERD, hypertension, hypothyroidism, chronic dementia,  Alzheimer's type, progressive cognitive decline with history of sundowning on risperidone 0.25 mg was sent from nursing home status post fall. History is obtained from daughter who reports after she called the facility and she was told that her mom has slipped in the bathroom and had a left hip pain. She denies any LOC. X-ray hip shows displaced left femoral neck fracture. Orthopeadics consulted,  Patient underwent left hip hemiarthroplasty , tolerated well.  Postoperative day 2.   Assessment & Plan:   Active Problems:   Hip fx (HCC)  Displaced subcapital fracture of the left femoral neck: Patient presented status post fall in a nursing home. X-ray hip shows displaced subcapital fracture of left femoral neck. Continue hip fracture protocol. Orthopedic consulted, status post left hip hemiarthroplasty,  Postoperative day 2. She reports pain is controlled.  Adequate pain control PT and OT evaluation. Recommended weightbearing as tolerated on left leg, continue physical therapy.  Acute Sinusitis  noted on CT scan.  Continue Z-Pak.  Hyperglycemia Hb A1c 6.4  monitor fingersticks.  GERD Stable.  HTN: Blood pressure remains elevated because of pain. Hydralazine as needed  Hypothyroidism Continue synthroid  Chronic dementia, Alzheimer's type progressive decline with history of sundowning  Continue  Risperidone 0.25mg   Per last pcp note for behavioral disturbances and sleep Home dose of Risperdal 0.5 mg twice daily resumed   DVT prophylaxis: SCDs Code Status: Full code Family Communication:  No family at bed side. Disposition Plan:   Status is: Inpatient  Remains inpatient appropriate because:Inpatient level of care appropriate  due to severity of illness   Dispo: The patient is from: SNF              Anticipated d/c is to: SNF              Anticipated d/c date is: > 3 days              Patient currently is not medically stable to d/c.   Consultants:   Orthopedics  Procedures: Left hip hemiarthroplasty.    Antimicrobials:  Anti-infectives (From admission, onward)   Start     Dose/Rate Route Frequency Ordered Stop   05/05/20 1500  ceFAZolin (ANCEF) IVPB 2g/100 mL premix        2 g 200 mL/hr over 30 Minutes Intravenous Every 6 hours 05/05/20 1318 05/06/20 0606   05/05/20 0600  ceFAZolin (ANCEF) IVPB 2g/100 mL premix        2 g 200 mL/hr over 30 Minutes Intravenous 30 min pre-op 05/04/20 1440 05/05/20 0957   05/04/20 1930  azithromycin (ZITHROMAX) tablet 500 mg        500 mg Oral Daily 05/04/20 1930 05/09/20 0959   05/04/20 1402  ceFAZolin (ANCEF) IVPB 2g/100 mL premix  Status:  Discontinued        2 g 200 mL/hr over 30 Minutes Intravenous 30 min pre-op 05/04/20 1404 05/04/20 1440      Subjective: Patient was seen and examined at bedside. Overnight events noted.  Patient is  s/p left hip hemiarthroplasty, tolerated well.  Postoperative day 2. Patient seems confused, agitated,  trying to get out of the bed. Objective: Vitals:   05/05/20 2011 05/05/20 2345 05/06/20 0336 05/06/20 0748  BP: 125/70 134/65 121/66 140/72  Pulse: 84 81  76 78  Resp:    16  Temp: 98.3 F (36.8 C) 97.9 F (36.6 C) 98.5 F (36.9 C) 98 F (36.7 C)  TempSrc: Oral Oral Oral   SpO2: 93% 92% 95% 91%  Weight:      Height:       No intake or output data in the 24 hours ending 05/06/20 1402 Filed Weights   05/04/20 0855  Weight: 65.8 kg    Examination:  General exam: Appears calm and comfortable, not in any acute distress Respiratory system: Clear to auscultation. Respiratory effort normal. Cardiovascular system: S1 & S2 heard, RRR. No JVD, murmurs, rubs, gallops or clicks. No pedal edema. Gastrointestinal system:  Abdomen is nondistended, soft and nontender. No organomegaly or masses felt. Normal bowel sounds heard. Central nervous system: Alert and confused. No focal neurological deficits. Extremities: No edema, no cyanosis, no clubbing.  Left hip tenderness Skin: No rashes, lesions or ulcers Psychiatry: Judgement and insight appear normal. Mood & affect appropriate.     Data Reviewed: I have personally reviewed following labs and imaging studies  CBC: Recent Labs  Lab 05/04/20 0900 05/05/20 0329 05/06/20 0615  WBC 18.5* 17.4* 17.5*  HGB 14.7 14.1 12.7  HCT 45.5 41.9 37.9  MCV 90.8 88.2 89.2  PLT 194 186 164   Basic Metabolic Panel: Recent Labs  Lab 05/04/20 0900 05/05/20 0329 05/06/20 0615  NA 138 138 138  K 4.3 4.1 4.1  CL 102 102 108  CO2 24 25 23   GLUCOSE 235* 198* 162*  BUN 20 16 19   CREATININE 0.92 0.65 0.61  CALCIUM 9.1 8.8* 8.1*  MG  --   --  2.0  PHOS  --   --  2.1*   GFR: Estimated Creatinine Clearance: 44.5 mL/min (by C-G formula based on SCr of 0.61 mg/dL). Liver Function Tests: No results for input(s): AST, ALT, ALKPHOS, BILITOT, PROT, ALBUMIN in the last 168 hours. No results for input(s): LIPASE, AMYLASE in the last 168 hours. No results for input(s): AMMONIA in the last 168 hours. Coagulation Profile: No results for input(s): INR, PROTIME in the last 168 hours. Cardiac Enzymes: No results for input(s): CKTOTAL, CKMB, CKMBINDEX, TROPONINI in the last 168 hours. BNP (last 3 results) No results for input(s): PROBNP in the last 8760 hours. HbA1C: Recent Labs    05/04/20 2107  HGBA1C 6.4*   CBG: Recent Labs  Lab 05/05/20 0754 05/05/20 0850 05/05/20 1657 05/05/20 2107 05/06/20 0748  GLUCAP 204* 187* 242* 181* 161*   Lipid Profile: No results for input(s): CHOL, HDL, LDLCALC, TRIG, CHOLHDL, LDLDIRECT in the last 72 hours. Thyroid Function Tests: Recent Labs    05/04/20 2107  TSH 3.829   Anemia Panel: No results for input(s): VITAMINB12,  FOLATE, FERRITIN, TIBC, IRON, RETICCTPCT in the last 72 hours. Sepsis Labs: No results for input(s): PROCALCITON, LATICACIDVEN in the last 168 hours.  Recent Results (from the past 240 hour(s))  SARS Coronavirus 2 by RT PCR (hospital order, performed in [facility] hospital lab) Nasopharyngeal Nasopharyngeal Swab     Status: None   Collection Time: 05/04/20  2:09 PM   Specimen: Nasopharyngeal Swab  Result Value Ref Range Status   SARS Coronavirus 2 NEGATIVE NEGATIVE Final    Comment: (NOTE) SARS-CoV-2 target nucleic acids are NOT DETECTED.  The SARS-CoV-2 RNA is generally detectable in upper and lower respiratory specimens during the acute phase of infection. The lowest concentration of SARS-CoV-2 viral copies this assay can detect is 250 copies / mL. A  negative result does not preclude SARS-CoV-2 infection and should not be used as the sole basis for treatment or other patient management decisions.  A negative result may occur with improper specimen collection / handling, submission of specimen other than nasopharyngeal swab, presence of viral mutation(s) within the areas targeted by this assay, and inadequate number of viral copies (<250 copies / mL). A negative result must be combined with clinical observations, patient history, and epidemiological information.  Fact Sheet for Patients:   BoilerBrush.com.cy  Fact Sheet for Healthcare Providers: https://pope.com/  This test is not yet approved or  cleared by the [place] FDA and has been authorized for detection and/or diagnosis of SARS-CoV-2 by FDA under an Emergency Use Authorization (EUA).  This EUA will remain in effect (meaning this test can be used) for the duration of the COVID-19 declaration under Section 564(b)(1) of the Act, 21 U.S.C. section 360bbb-3(b)(1), unless the authorization is terminated or revoked sooner.  Performed at [facility], [address] [place], [place] [address]   MRSA PCR Screening     Status: None   Collection Time: 05/05/20  1:53 AM   Specimen: Nasopharyngeal  Result Value Ref Range Status   MRSA by PCR NEGATIVE NEGATIVE Final    Comment:        The GeneXpert MRSA Assay (FDA approved for NASAL specimens only), is one component of a comprehensive MRSA colonization surveillance program. It is not intended to diagnose MRSA infection nor to guide or monitor treatment for MRSA infections. Performed at [facility], [address] [place], [place] [address]     Radiology Studies: DG HIP UNILAT W OR W/O PELVIS 2-3 VIEWS LEFT  Result Date: 05/05/2020 CLINICAL DATA:  Status post total hip replacement on the left EXAM: DG HIP (WITH OR WITHOUT PELVIS) 2-3V LEFT COMPARISON:  May 04, 2020. FINDINGS: Frontal pelvis and lateral left hip images were obtained. Patient is status post total hip replacement on the left with prosthetic components well-seated. No acute fracture or dislocation. Moderate narrowing right hip joint. IMPRESSION: Total hip replacement on the left with prosthetic components well-seated. No acute fracture or dislocation. Narrowing right hip joint. Electronically Signed   By: Bretta Bang III M.D.   On: 05/05/2020 12:12   Scheduled Meds: . azithromycin  500 mg Oral Daily  . docusate sodium  100 mg Oral BID  . enoxaparin (LOVENOX) injection  40 mg Subcutaneous Q24H  . feeding supplement  237 mL Oral BID BM  . levothyroxine  100 mcg Oral Q0600  . multivitamin with minerals  1 tablet Oral Daily  . phosphorus  250 mg Oral TID  . risperiDONE  0.5 mg Oral BID   Continuous Infusions:    LOS: 2 days    Time spent: 25 mins    Cipriano Bunker, MD [facility]   If 7PM-7AM, please contact night-coverage

## 2020-05-06 NOTE — Anesthesia Postprocedure Evaluation (Signed)
Anesthesia Post Note  Patient: Megan Carpenter  Procedure(s) Performed: ARTHROPLASTY BIPOLAR HIP (HEMIARTHROPLASTY) (Left Hip)  Patient location during evaluation: Nursing Unit Anesthesia Type: Spinal Level of consciousness: awake and alert Pain management: pain level controlled Vital Signs Assessment: post-procedure vital signs reviewed and stable Respiratory status: spontaneous breathing, respiratory function stable and patient connected to nasal cannula oxygen Cardiovascular status: blood pressure returned to baseline and stable Postop Assessment: no headache, no backache, no apparent nausea or vomiting, spinal receding and patient able to bend at knees Anesthetic complications: no   No complications documented.   Last Vitals:  Vitals:   05/06/20 0336 05/06/20 0748  BP: 121/66 140/72  Pulse: 76 78  Resp:  16  Temp: 36.9 C 36.7 C  SpO2: 95% 91%    Last Pain:  Vitals:   05/06/20 0336  TempSrc: Oral  PainSc:                  Karleen Hampshire

## 2020-05-06 NOTE — Progress Notes (Signed)
  Subjective: 1 Day Post-Op Procedure(s) (LRB): ARTHROPLASTY BIPOLAR HIP (HEMIARTHROPLASTY) (Left) Patient reports pain as mild.   Patient is well, and has had no acute complaints or problems Plan is to go Rehab after hospital stay. Negative for chest pain and shortness of breath Fever: no Gastrointestinal: Negative for nausea and vomiting  Objective: Vital signs in last 24 hours: Temp:  [97.5 F (36.4 C)-99.3 F (37.4 C)] 98 F (36.7 C) (01/22 0748) Pulse Rate:  [75-94] 78 (01/22 0748) Resp:  [10-22] 16 (01/22 0748) BP: (109-169)/(64-96) 140/72 (01/22 0748) SpO2:  [91 %-100 %] 91 % (01/22 0748)  Intake/Output from previous day:  Intake/Output Summary (Last 24 hours) at 05/06/2020 0810 Last data filed at 05/05/2020 1104 Gross per 24 hour  Intake 800 ml  Output 125 ml  Net 675 ml    Intake/Output this shift: No intake/output data recorded.  Labs: Recent Labs    05/04/20 0900 05/05/20 0329 05/06/20 0615  HGB 14.7 14.1 12.7   Recent Labs    05/05/20 0329 05/06/20 0615  WBC 17.4* 17.5*  RBC 4.75 4.25  HCT 41.9 37.9  PLT 186 164   Recent Labs    05/05/20 0329 05/06/20 0615  NA 138 138  K 4.1 4.1  CL 102 108  CO2 25 23  BUN 16 19  CREATININE 0.65 0.61  GLUCOSE 198* 162*  CALCIUM 8.8* 8.1*   No results for input(s): LABPT, INR in the last 72 hours.   EXAM General - Patient is Alert and Oriented Extremity - Neurovascular intact Sensation intact distally Dorsiflexion/Plantar flexion intact Compartment soft Dressing/Incision - clean, dry, no drainage Motor Function - intact, moving foot and toes well on exam.   Past Medical History:  Diagnosis Date  . GERD (gastroesophageal reflux disease)   . Hypertension   . Thyroid disease     Assessment/Plan: 1 Day Post-Op Procedure(s) (LRB): ARTHROPLASTY BIPOLAR HIP (HEMIARTHROPLASTY) (Left) Active Problems:   Hip fx (HCC)  Estimated body mass index is 24.89 kg/m as calculated from the following:    Height as of this encounter: 5\' 4"  (1.626 m).   Weight as of this encounter: 65.8 kg. Advance diet Up with therapy D/C IV fluids Discharge to SNF when cleared by medicine.  Follow-up at [facility] clinic orthopedics in 2 weeks for staple removal  DVT Prophylaxis - Lovenox, Foot Pumps and TED hose Weight-Bearing as tolerated to left leg  [facility], PA-C Orthopaedic Surgery 05/06/2020, 8:10 AM

## 2020-05-06 NOTE — Plan of Care (Signed)

## 2020-05-06 NOTE — Progress Notes (Signed)
Physical Therapy Treatment Patient Details Name: Megan Carpenter MRN: [id number] DOB: 04/02/1929 Today's Date: 05/06/2020    History of Present Illness Pt is an 87 y/o F who presented s/p mechanical fall in bathroom and L hip pain at [facility] (PLOF information obtained from pt's DTR in ED).  Pt found to have L displaced subcapital fracture of femoral neck. Now s/p L hemiarthroplasty with posterior hip precautions.  PMHx:  GERD, HTN, thyroid disease, Alz dementia, sundowning.    PT Comments    Pt was up to side of bed with mod assist and then mod max to pivot to [facility] at pt request.  Her continued struggle with maintaining upright posture is hindered by attention and pain on L hip.  Follow up with her to continue ex's and work on her awareness of sequence and safety.  Follow along with all goals of acute PT and focus on standing stability, precautions and strength/awareness of her LLE to move with more independence.   Follow Up Recommendations  SNF     Equipment Recommendations  None recommended by PT    Recommendations for Other Services       Precautions / Restrictions Precautions Precautions: Fall;Posterior Hip Precaution Booklet Issued: No (pt did not remember instructions in a short time of checking recall) Precaution Comments: posterior hip Restrictions Weight Bearing Restrictions: Yes LLE Weight Bearing: Weight bearing as tolerated Other Position/Activity Restrictions: posterior precautions    Mobility  Bed Mobility Overal bed mobility: Needs Assistance Bed Mobility: Supine to Sit;Sit to Supine Rolling: Min assist   Supine to sit: Mod assist Sit to supine: Max assist;HOB elevated   General bed mobility comments: use of bed rails, tactile cues to sequence  Transfers Overall transfer level: Needs assistance Equipment used: Rolling walker (2 wheeled);1 person hand held assist Transfers: Sit to/from [employer] Sit to Stand: Max assist Stand pivot  transfers: Max assist       General transfer comment: pt is unable to be assisted on walker very well, used direct assist to pivot to chair  Ambulation/Gait             General Gait Details: unable to walk   Stairs             Wheelchair Mobility    Modified Rankin (Stroke Patients Only)       Balance Overall balance assessment: Needs assistance Sitting-balance support: Feet supported Sitting balance-Leahy Scale: Poor Sitting balance - Comments: benefits from B UE Support, can sustain static for short bouts with unilateral support, typically R lateral leaning to weight shift off L hip.   Standing balance support: Bilateral upper extremity supported;During functional activity Standing balance-Leahy Scale: Zero Standing balance comment: requires B UE Support and external support of at least MOD A.                            Cognition Arousal/Alertness: Awake/alert Behavior During Therapy: WFL for tasks assessed/performed Overall Cognitive Status: No family/caregiver present to determine baseline cognitive functioning                                 General Comments: Pt is pleasantly confused. She is only oriented to self. She is able to follow ~75% of simple one step commands with increased time and sometimes tactile cues to attend.      Exercises General Exercises - Lower Extremity Ankle  Circles/Pumps: AAROM;5 reps Heel Slides: AAROM;10 reps Hip ABduction/ADduction: AAROM;10 reps Other Exercises Other Exercises: OT facilitates ed re: posterior THPs, pt with very little reception, she is somewhat better able to comprehend the pictograph handout versus verbal or typed. MIN/MOD comprehension detected. bit poor short term memory and carrryover with no evidence of learning after ~5 mins. Will not initiate AE training at this time.    General Comments General comments (skin integrity, edema, etc.): assisted to chair with pivot as pt cannot  stand up on walker properly      Pertinent Vitals/Pain Pain Assessment: Faces Faces Pain Scale: Hurts little more Pain Location: L hip with attempts to mobilize Pain Descriptors / Indicators: Grimacing Pain Intervention(s): Limited activity within patient's tolerance;Monitored during session;Repositioned (eleavted heels off bed, two pillows to also provide separation between LEs for hip precautions.)    Home Living Family/patient expects to be discharged to:: Assisted living             Home Equipment: Other (comment) (unsure of reliabilty of history) Additional Comments: reports using a walker    Prior Function Level of Independence: Needs assistance  Gait / Transfers Assistance Needed: reports using RW ADL's / Homemaking Assistance Needed: ALF support for self care and meals, meds     PT Goals (current goals can now be found in the care plan section) Acute Rehab PT Goals Patient Stated Goal: none stated PT Goal Formulation: Patient unable to participate in goal setting Time For Goal Achievement: 05/20/20 Potential to Achieve Goals: Fair    Frequency    BID      PT Plan      Co-evaluation              AM-PAC PT "6 Clicks" Mobility   Outcome Measure  Help needed turning from your back to your side while in a flat bed without using bedrails?: A Lot Help needed moving from lying on your back to sitting on the side of a flat bed without using bedrails?: A Lot Help needed moving to and from a bed to a chair (including a wheelchair)?: A Lot Help needed standing up from a chair using your arms (e.g., wheelchair or bedside chair)?: A Lot Help needed to walk in hospital room?: A Lot Help needed climbing 3-5 steps with a railing? : Total 6 Click Score: 11    End of Session Equipment Utilized During Treatment: Gait belt Activity Tolerance: Patient limited by fatigue;Treatment limited secondary to medical complications (Comment) Patient left: in chair;with call  bell/phone within reach;with chair alarm set Nurse Communication: Mobility status;Other (comment) (transfers) PT Visit Diagnosis: Unsteadiness on feet (R26.81);Muscle weakness (generalized) (M62.81);Pain;Difficulty in walking, not elsewhere classified (R26.2);History of falling (Z91.81) Pain - Right/Left: Left Pain - part of body: Hip     Time: 9326-7124 PT Time Calculation (min) (ACUTE ONLY): 26 min  Charges:  $Therapeutic Exercise: 8-22 mins $Therapeutic Activity: 8-22 mins                   Ivar Drape 05/06/2020, 6:00 PM  Samul Dada, PT MS Acute Rehab Dept. Number: [facility] [id number] and [facility] [phone]

## 2020-05-06 NOTE — Evaluation (Signed)
Occupational Therapy Evaluation Patient Details Name: Megan Carpenter MRN: [id number] DOB: 11-18-1928 Today's Date: 05/06/2020    History of Present Illness Pt is an 87 y/o F who presented s/p mechanical fall in bathroom and L hip pain at [facility] (PLOF information obtained from pt's DTR in ED).  Pt found to have L displaced subcapital fracture of femoral neck. Now s/p L hemiarthroplasty with posterior hip precautions.  PMHx:  GERD, HTN, thyroid disease, Alz dementia, sundowning.   Clinical Impression   Pt seen for OT Evaluation this date in setting of acute hospitalization with fall and now s/p L hip hemiarthroplasty. Pt is pleasantly confused but is a poor historian. She reports that she lives completely by herself and does everything herself. Mentions RW, but states she is able to walk w/o it. Per chart review, pt's daughter indicated in ED that pt presented from [facility]. Unclear of assistance level or equipment used, but ALF at least providing IADL assist. When OT presents to room, RN and orienting RN assisting to clean pt up as she had loose BM. This date, pt requires MIN/MOD A +2 for rolling and peri care (for thorough completion, pt contributes with cues, but is unable to reasonably and infection-consciously manage the soiled areas of her skin), MAX A for sup<>sit and MAX A for sit<>stand with arm in arm assist. D/t confusion, pt unable to sequence use of AE for LB ADLs to adhere to precautions at this time. Pt requires MAX/TOTAL A to don socks. OT educates pt re: THPs with picture handout and pt moderately receptive, but with poor short term memory/carryove after ~5 minute window. Pt requires gentle re-direction and verbal/tactile cues for safety, sequence and re-direction throughout. She is left in bed with all needs met and in reach and bed alarm activated. RN notified of contents of OT session. Will continue to follow acutely and anticipate pt will require extensive rehabilitation  in STR setting to regain strength to safely complete ADLs/ADL mobility as she heals.     Follow Up Recommendations  SNF;Supervision/Assistance - 24 hour    Equipment Recommendations  Other (comment) (defer to next level of care)    Recommendations for Other Services       Precautions / Restrictions Precautions Precautions: Fall;Posterior Hip Precaution Booklet Issued:  (OT issued photo handout of precautions) Precaution Comments: posterior hip Restrictions Weight Bearing Restrictions: Yes LLE Weight Bearing: Weight bearing as tolerated      Mobility Bed Mobility Overal bed mobility: Needs Assistance Bed Mobility: Sit to Supine;Supine to Sit;Rolling Rolling: Min assist;Mod assist;+2 for physical assistance   Supine to sit: Max assist;HOB elevated Sit to supine: Max assist;HOB elevated   General bed mobility comments: use of bed rails, tactile cues to sequence    Transfers Overall transfer level: Needs assistance Equipment used: 1 person hand held assist (arm in arm) Transfers: Sit to/from Stand Sit to Stand: Max assist         General transfer comment: arm in arm, pt with diffiuclty sequencing, doe snot fully clear bottom.    Balance Overall balance assessment: Needs assistance   Sitting balance-Leahy Scale: Poor Sitting balance - Comments: benefits from B UE Support, can sustain static for short bouts with unilateral support, typically R lateral leaning to weight shift off L hip.     Standing balance-Leahy Scale: Poor Standing balance comment: requires B UE Support and external support of at least MOD A.  ADL either performed or assessed with clinical judgement   ADL                                         General ADL Comments: Pt requries MIN A and tactile/verbal cues to sequence UB ADLs at bed level and sitting. MOD/MAX A for seated LB ADLs including attempting to thread socks. Requires MAX cues for  adherance to precautions on L side.     Vision Patient Visual Report: No change from baseline       Perception     Praxis      Pertinent Vitals/Pain Pain Assessment: Faces Faces Pain Scale: Hurts little more Pain Location: L hip with attempts to mobilize Pain Descriptors / Indicators: Grimacing Pain Intervention(s): Limited activity within patient's tolerance;Monitored during session;Repositioned (eleavted heels off bed, two pillows to also provide separation between LEs for hip precautions.)     Hand Dominance     Extremity/Trunk Assessment Upper Extremity Assessment Upper Extremity Assessment: Generalized weakness   Lower Extremity Assessment Lower Extremity Assessment: Defer to PT evaluation;RLE deficits/detail;LLE deficits/detail RLE Deficits / Details: MMT grossly 4-/5, ROM appears to be age appropriate LLE Deficits / Details: unable to fully assess d/t precautions and pt guarding. MMT of DF/PF grossly 3+/5 LLE: Unable to fully assess due to pain   Cervical / Trunk Assessment Cervical / Trunk Assessment: Kyphotic   Communication Communication Communication: No difficulties   Cognition Arousal/Alertness: Awake/alert Behavior During Therapy: WFL for tasks assessed/performed Overall Cognitive Status: No family/caregiver present to determine baseline cognitive functioning                                 General Comments: Pt is pleasantly confused. She is only oriented to self. She is able to follow ~75% of simple one step commands with increased time and sometimes tactile cues to attend.   General Comments       Exercises Other Exercises Other Exercises: OT facilitates ed re: posterior THPs, pt with very little reception, she is somewhat better able to comprehend the pictograph handout versus verbal or typed. MIN/MOD comprehension detected. bit poor short term memory and carrryover with no evidence of learning after ~5 mins. Will not initiate AE training  at this time.   Shoulder Instructions      Home Living Family/patient expects to be discharged to:: Assisted living                             Home Equipment: Other (comment) (pt is not a reliable historian)   Additional Comments: Unsure of previously used equipment or PLOF. Pt is questionable historian as she is confused. She does make some reference to a RW, but also reports that she lives by herself despite documentation indicating that pt is from ALF.      Prior Functioning/Environment Level of Independence: Needs assistance  Gait / Transfers Assistance Needed: reports using RW ADL's / Homemaking Assistance Needed: ALF support for self care and meals, meds            OT Problem List: Decreased strength;Decreased range of motion;Decreased activity tolerance;Impaired balance (sitting and/or standing);Decreased coordination;Decreased cognition;Decreased safety awareness;Decreased knowledge of use of DME or AE;Decreased knowledge of precautions;Pain      OT Treatment/Interventions: Self-care/ADL training;DME and/or AE instruction;Therapeutic activities;Balance  training;Therapeutic exercise;Energy conservation;Cognitive remediation/compensation;Patient/family education    OT Goals(Current goals can be found in the care plan section) Acute Rehab OT Goals Patient Stated Goal: none stated OT Goal Formulation: Patient unable to participate in goal setting Time For Goal Achievement: 05/20/20 Potential to Achieve Goals: Fair ADL Goals Pt Will Perform Lower Body Dressing: with caregiver independent in assisting;sit to/from stand (with MIN verbal cues and MIN A with RW to CTS for clothing mgt over hips) Pt Will Transfer to Toilet: with min assist;stand pivot transfer;bedside commode (with adherance to precautions with <30% verbal cues) Pt Will Perform Toileting - Clothing Manipulation and hygiene: with min assist;sit to/from stand (with RW/grab bars with adherance to  precautions with <30% verbal cues.)  OT Frequency: Min 1X/week   Barriers to D/C:            Co-evaluation              AM-PAC OT "6 Clicks" Daily Activity     Outcome Measure Help from another person eating meals?: None Help from another person taking care of personal grooming?: A Little Help from another person toileting, which includes using toliet, bedpan, or urinal?: A Lot Help from another person bathing (including washing, rinsing, drying)?: A Lot Help from another person to put on and taking off regular upper body clothing?: A Little Help from another person to put on and taking off regular lower body clothing?: A Lot 6 Click Score: 16   End of Session Nurse Communication: Other (comment) (nurse and orientee present throughout session, teamwork utilized to get pt, pt's room and pt's linens clean.)  Activity Tolerance: Patient tolerated treatment well Patient left: in bed;with call bell/phone within reach;with bed alarm set (bed alarm set on medium sensitivity as pt was already found by nurisng, attempting to get out of the chair and had apparently had a liquid BP that she caught with a cup off her bed side table, unsuccessfully given the soiled linens and floor at start.)  OT Visit Diagnosis: Unsteadiness on feet (R26.81);Muscle weakness (generalized) (M62.81)                Time: 1594-7076 OT Time Calculation (min): 43 min Charges:  OT General Charges $OT Visit: 1 Visit OT Evaluation $OT Eval Moderate Complexity: 1 Mod OT Treatments $Self Care/Home Management : 8-22 mins $Therapeutic Activity: 8-22 mins  Gerrianne Scale, MS, OTR/L ascom [phone] 05/06/20, 3:16 PM

## 2020-05-06 NOTE — Evaluation (Signed)
Physical Therapy Evaluation Patient Details Name: Megan Carpenter MRN: [id number] DOB: [birth year] Today's Date: 05/06/2020   History of Present Illness  Pt is an 87 y/o F who presented s/p mechanical fall in bathroom and L hip pain at [facility] (PLOF information obtained from pt's DTR in ED).  Pt found to have L displaced subcapital fracture of femoral neck. Now s/p L hemiarthroplasty with posterior hip precautions.  PMHx:  GERD, HTN, thyroid disease, Alz dementia, sundowning.  Clinical Impression  Pt was seen for mobility on RW with assistance to get up to stand and pivot with PT directly assisting finally.  Her struggle is with some pain and her ability to understand the instructions.  Pt cannot remember the precautions and has no abd cushion.  Will reposition in bed upon return with pillows to provide the abd and avoidance of hip flexion that her condition requires, as pt is unable to recall any of the precautions when reminded.    Follow Up Recommendations SNF    Equipment Recommendations  None recommended by PT    Recommendations for Other Services       Precautions / Restrictions Precautions Precautions: Fall;Posterior Hip Precaution Booklet Issued: No (pt did not remember instructions in a short time of checking recall) Precaution Comments: posterior hip Restrictions Weight Bearing Restrictions: Yes LLE Weight Bearing: Weight bearing as tolerated Other Position/Activity Restrictions: posterior precautions      Mobility  Bed Mobility Overal bed mobility: Needs Assistance Bed Mobility: Supine to Sit;Sit to Supine Rolling: Min assist   Supine to sit: Mod assist Sit to supine: Max assist;HOB elevated   General bed mobility comments: use of bed rails, tactile cues to sequence    Transfers Overall transfer level: Needs assistance Equipment used: Rolling walker (2 wheeled);1 person hand held assist Transfers: Sit to/from [employer] Sit to Stand: Max  assist Stand pivot transfers: Max assist       General transfer comment: pt is unable to be assisted on walker very well, used direct assist to pivot to chair  Ambulation/Gait             General Gait Details: unable to walk  Stairs            Wheelchair Mobility    Modified Rankin (Stroke Patients Only)       Balance Overall balance assessment: Needs assistance Sitting-balance support: Feet supported Sitting balance-Leahy Scale: Poor Sitting balance - Comments: benefits from B UE Support, can sustain static for short bouts with unilateral support, typically R lateral leaning to weight shift off L hip.   Standing balance support: Bilateral upper extremity supported;During functional activity Standing balance-Leahy Scale: Zero Standing balance comment: requires B UE Support and external support of at least MOD A.                             Pertinent Vitals/Pain Pain Assessment: Faces Faces Pain Scale: Hurts little more Pain Location: L hip with attempts to mobilize Pain Descriptors / Indicators: Grimacing Pain Intervention(s): Limited activity within patient's tolerance;Monitored during session;Repositioned (eleavted heels off bed, two pillows to also provide separation between LEs for hip precautions.)    Home Living Family/patient expects to be discharged to:: Assisted living               Home Equipment: Other (comment) (unsure of reliabilty of history) Additional Comments: reports using a walker    Prior Function Level of Independence: Needs  assistance   Gait / Transfers Assistance Needed: reports using RW  ADL's / Homemaking Assistance Needed: ALF support for self care and meals, meds        Hand Dominance        Extremity/Trunk Assessment   Upper Extremity Assessment Upper Extremity Assessment: Defer to OT evaluation    Lower Extremity Assessment Lower Extremity Assessment: Generalized weakness;LLE deficits/detail RLE  Deficits / Details: 4 to 4+ LLE Deficits / Details: 3- L hip and 4- knee and ankle LLE: Unable to fully assess due to pain LLE Coordination: decreased gross motor    Cervical / Trunk Assessment Cervical / Trunk Assessment: Kyphotic  Communication   Communication: No difficulties  Cognition Arousal/Alertness: Awake/alert Behavior During Therapy: WFL for tasks assessed/performed Overall Cognitive Status: No family/caregiver present to determine baseline cognitive functioning                                 General Comments: Pt is pleasantly confused. She is only oriented to self. She is able to follow ~75% of simple one step commands with increased time and sometimes tactile cues to attend.      General Comments General comments (skin integrity, edema, etc.): assisted to chair with pivot as pt cannot stand up on walker properly    Exercises Other Exercises Other Exercises: OT facilitates ed re: posterior THPs, pt with very little reception, she is somewhat better able to comprehend the pictograph handout versus verbal or typed. MIN/MOD comprehension detected. bit poor short term memory and carrryover with no evidence of learning after ~5 mins. Will not initiate AE training at this time.   Assessment/Plan    PT Assessment Patient needs continued PT services  PT Problem List Decreased strength;Decreased activity tolerance;Decreased range of motion;Decreased balance;Decreased mobility;Decreased coordination;Decreased knowledge of use of DME;Decreased cognition;Decreased safety awareness;Decreased knowledge of precautions;Pain;Decreased skin integrity       PT Treatment Interventions DME instruction;Gait training;Functional mobility training;Therapeutic activities;Therapeutic exercise;Balance training;Neuromuscular re-education;Patient/family education    PT Goals (Current goals can be found in the Care Plan section)  Acute Rehab PT Goals Patient Stated Goal: none  stated PT Goal Formulation: Patient unable to participate in goal setting Time For Goal Achievement: 05/20/20 Potential to Achieve Goals: Fair    Frequency BID   Barriers to discharge Decreased caregiver support may have limited staff help in ALF    Co-evaluation               AM-PAC PT "6 Clicks" Mobility  Outcome Measure Help needed turning from your back to your side while in a flat bed without using bedrails?: A Lot Help needed moving from lying on your back to sitting on the side of a flat bed without using bedrails?: A Lot Help needed moving to and from a bed to a chair (including a wheelchair)?: A Lot Help needed standing up from a chair using your arms (e.g., wheelchair or bedside chair)?: A Lot Help needed to walk in hospital room?: A Lot Help needed climbing 3-5 steps with a railing? : Total 6 Click Score: 11    End of Session Equipment Utilized During Treatment: Gait belt Activity Tolerance: Patient limited by fatigue;Treatment limited secondary to medical complications (Comment) Patient left: in chair;with call bell/phone within reach;with chair alarm set Nurse Communication: Mobility status;Other (comment) (transfers) PT Visit Diagnosis: Unsteadiness on feet (R26.81);Muscle weakness (generalized) (M62.81);Pain;Difficulty in walking, not elsewhere classified (R26.2);History of falling (Z91.81) Pain -  Right/Left: Left Pain - part of body: Hip    Time: 5701-7793 PT Time Calculation (min) (ACUTE ONLY): 24 min   Charges:   PT Evaluation $PT Eval Moderate Complexity: 1 Mod PT Treatments $Therapeutic Activity: 8-22 mins       Ivar Drape 05/06/2020, 5:54 PM  Samul Dada, PT MS Acute Rehab Dept. Number: [facility] [id number] and [facility] [phone]

## 2020-05-07 LAB — BASIC METABOLIC PANEL
Anion gap: 7 (ref 5–15)
BUN: 15 mg/dL (ref 8–23)
CO2: 26 mmol/L (ref 22–32)
Calcium: 7.9 mg/dL — ABNORMAL LOW (ref 8.9–10.3)
Chloride: 106 mmol/L (ref 98–111)
Creatinine, Ser: 0.54 mg/dL (ref 0.44–1.00)
GFR, Estimated: >60 mL/min (ref >60)
Glucose, Bld: 155 mg/dL — ABNORMAL HIGH (ref 70–99)
Potassium: 4.1 mmol/L (ref 3.5–5.1)
Sodium: 139 mmol/L (ref 135–145)

## 2020-05-07 LAB — CBC
HCT: 34.9 % — ABNORMAL LOW (ref 36.0–46.0)
Hemoglobin: 11.5 g/dL — ABNORMAL LOW (ref 12.0–15.0)
MCH: 29.6 pg (ref 26.0–34.0)
MCHC: 33 g/dL (ref 30.0–36.0)
MCV: 89.9 fL (ref 80.0–100.0)
Platelets: 144 10*3/uL — ABNORMAL LOW (ref 150–400)
RBC: 3.88 MIL/uL (ref 3.87–5.11)
RDW: 13.7 % (ref 11.5–15.5)
WBC: 12.5 10*3/uL — ABNORMAL HIGH (ref 4.0–10.5)
nRBC: 0 % (ref 0.0–0.2)

## 2020-05-07 LAB — GLUCOSE, CAPILLARY
Glucose-Capillary: 157 mg/dL — ABNORMAL HIGH (ref 70–99)
Glucose-Capillary: 156 mg/dL — ABNORMAL HIGH (ref 70–99)
Glucose-Capillary: 138 mg/dL — ABNORMAL HIGH (ref 70–99)

## 2020-05-07 NOTE — Progress Notes (Addendum)
Physical Therapy Treatment Patient Details Name: Megan Carpenter MRN: [id number] DOB: 1928-05-28 Today's Date: 05/07/2020    History of Present Illness Pt is an 87 y/o F who presented s/p mechanical fall in bathroom and L hip pain at [facility] (PLOF information obtained from pt's DTR in ED).  Pt found to have L displaced subcapital fracture of femoral neck. Now s/p L hemiarthroplasty with posterior hip precautions.  PMHx:  GERD, HTN, thyroid disease, Alz dementia, sundowning.    PT Comments    Participated in exercises as described below.  To EOB with mod a x 1 but overall good effort by pt.  Steady in sitting with supervision.  Stood with walker and mod a x 1 to stand, min a x 1 once up.  She is able to march and SLR on LLE but unable to accept weight in attempts to transfer to chair without buckling.  She stated she needed to void and stand pivot transfer to commode and then to recliner with max a x 1 but good effort with shuffling feet.  Remained in recliner after session with needs met.  Assisted with fluids as urine was a bit dark and she is able to drink all on meal tray.  RN aware.   Follow Up Recommendations  SNF     Equipment Recommendations  None recommended by PT    Recommendations for Other Services       Precautions / Restrictions Precautions Precautions: Fall;Posterior Hip Precaution Booklet Issued: No (pt did not remember instructions in a short time of checking recall) Precaution Comments: posterior hip Restrictions Weight Bearing Restrictions: Yes LLE Weight Bearing: Weight bearing as tolerated Other Position/Activity Restrictions: posterior precautions    Mobility  Bed Mobility Overal bed mobility: Needs Assistance Bed Mobility: Supine to Sit     Supine to sit: Mod assist        Transfers Overall transfer level: Needs assistance Equipment used: Rolling walker (2 wheeled);1 person hand held assist Transfers: Sit to/from [employer] Sit to Stand: Mod assist Stand pivot transfers: Max assist       General transfer comment: able to stand to walker but unable to WB LLE to accept step - buckles  Ambulation/Gait             General Gait Details: unable to walk   Stairs             Wheelchair Mobility    Modified Rankin (Stroke Patients Only)       Balance Overall balance assessment: Needs assistance Sitting-balance support: Feet supported Sitting balance-Leahy Scale: Fair Sitting balance - Comments: able to sit unsupported with supervision   Standing balance support: Bilateral upper extremity supported;During functional activity Standing balance-Leahy Scale: Poor Standing balance comment: able to stand with walker and min assist today                            Cognition Arousal/Alertness: Awake/alert Behavior During Therapy: WFL for tasks assessed/performed Overall Cognitive Status: No family/caregiver present to determine baseline cognitive functioning                                        Exercises Other Exercises Other Exercises: BLE AAROM supine x 10 Other Exercises: to commode to void    General Comments        Pertinent Vitals/Pain Pain  Assessment: Faces Faces Pain Scale: Hurts a little bit Pain Location: L hip with attempts to mobilize Pain Descriptors / Indicators: Grimacing Pain Intervention(s): Limited activity within patient's tolerance;Monitored during session;Repositioned    Home Living                      Prior Function            PT Goals (current goals can now be found in the care plan section) Progress towards PT goals: Progressing toward goals    Frequency    BID      PT Plan Current plan remains appropriate    Co-evaluation              AM-PAC PT "6 Clicks" Mobility   Outcome Measure  Help needed turning from your back to your side while in a flat bed without using bedrails?: A Lot Help  needed moving from lying on your back to sitting on the side of a flat bed without using bedrails?: A Lot Help needed moving to and from a bed to a chair (including a wheelchair)?: A Lot Help needed standing up from a chair using your arms (e.g., wheelchair or bedside chair)?: A Lot Help needed to walk in hospital room?: Total Help needed climbing 3-5 steps with a railing? : Total 6 Click Score: 10    End of Session Equipment Utilized During Treatment: Gait belt Activity Tolerance: Patient tolerated treatment well Patient left: in chair;with call bell/phone within reach;with chair alarm set Nurse Communication: Mobility status;Other (comment) Pain - Right/Left: Right Pain - part of body: Hip     Time: 1761-6073 PT Time Calculation (min) (ACUTE ONLY): 23 min  Charges:  $Therapeutic Exercise: 8-22 mins $Therapeutic Activity: 8-22 mins                    Chesley Noon, PTA 05/07/20, 10:00 AM

## 2020-05-07 NOTE — NC FL2 (Signed)
[place] MEDICAID FL2 LEVEL OF CARE SCREENING TOOL     IDENTIFICATION  Patient Name: Megan Carpenter Birthdate: [birth year] Sex: female Admission Date (Current Location): 05/04/2020  [place] and [place] Number:  Chiropodist and Address:  [facility], [address], [place], [place] [address]      Provider Number: [id number]  Attending Physician Name and Address:  Cipriano Bunker, MD  Relative Name and Phone Number:  Larene Beach [phone]    Current Level of Care: Hospital Recommended Level of Care: Skilled Nursing Facility Prior Approval Number:    Date Approved/Denied:   PASRR Number: [id number]  Discharge Plan: SNF    Current Diagnoses: Patient Active Problem List   Diagnosis Date Noted  . Hip fx (HCC) 05/04/2020  . Palliative care patient 08/03/2018  . Severe protein-calorie malnutrition (HCC) 07/02/2016  . Impacted cerumen of both ears 03/13/2016  . Hearing reduced, bilateral 03/13/2016  . Late onset Alzheimer's disease with behavioral disturbance (HCC) 11/27/2015  . Elevated hemoglobin A1c 03/28/2015  . GERD (gastroesophageal reflux disease) 03/28/2015  . Hypertension 03/28/2015  . Hypothyroidism 03/28/2015  . Osteoporosis 03/28/2015    Orientation RESPIRATION BLADDER Height & Weight     Self  Normal Incontinent Weight: 145 lb (65.8 kg) (Simultaneous filing. User may not have seen previous data.) Height:  5\' 4"  (162.6 cm)  BEHAVIORAL SYMPTOMS/MOOD NEUROLOGICAL BOWEL NUTRITION STATUS      Incontinent Diet  AMBULATORY STATUS COMMUNICATION OF NEEDS Skin   Limited Assist Verbally Normal                       Personal Care Assistance Level of Assistance  Bathing,Feeding,Dressing Bathing Assistance: Limited assistance Feeding assistance: Limited assistance Dressing Assistance: Limited assistance     Functional Limitations Info  Sight,Hearing,Speech Sight Info: Adequate Hearing Info: Adequate Speech Info: Adequate     SPECIAL CARE FACTORS FREQUENCY  PT (By licensed PT),OT (By licensed OT)     PT Frequency: 5x week OT Frequency: 5x week            Contractures Contractures Info: Not present    Additional Factors Info  Code Status,Allergies Code Status Info: Full Allergies Info: Penicillins           Current Medications (05/07/2020):  This is the current hospital active medication list Current Facility-Administered Medications  Medication Dose Route Frequency Provider Last Rate Last Admin  . acetaminophen (TYLENOL) tablet 325-650 mg  325-650 mg Oral Q6H PRN 05/09/2020, MD   650 mg at 05/07/20 0640  . azithromycin (ZITHROMAX) tablet 500 mg  500 mg Oral Daily Poggi, 05/09/20, MD   500 mg at 05/06/20 0958  . bisacodyl (DULCOLAX) suppository 10 mg  10 mg Rectal Daily PRN Poggi, 05/08/20, MD      . diphenhydrAMINE (BENADRYL) 12.5 MG/5ML elixir 12.5-25 mg  12.5-25 mg Oral Q4H PRN Poggi, 05-[birth year]1984, MD   12.5 mg at 05/06/20 1322  . docusate sodium (COLACE) capsule 100 mg  100 mg Oral BID 05/08/20, MD   100 mg at 05/06/20 2007  . enoxaparin (LOVENOX) injection 40 mg  40 mg Subcutaneous Q24H Poggi, 2008, MD   40 mg at 05/06/20 0849  . feeding supplement (ENSURE ENLIVE / ENSURE PLUS) liquid 237 mL  237 mL Oral BID BM 05/08/20, MD   237 mL at 05/06/20 1333  . HYDROcodone-acetaminophen (NORCO/VICODIN) 5-325 MG per tablet 1-2 tablet  1-2 tablet Oral Q6H PRN Poggi,  Excell Seltzer, MD   2 tablet at 05/07/20 1412  . levothyroxine (SYNTHROID) tablet 100 mcg  100 mcg Oral [id number] Christena Flake, MD   100 mcg at 05/07/20 0640  . magnesium hydroxide (MILK OF MAGNESIA) suspension 30 mL  30 mL Oral Daily PRN Poggi, Excell Seltzer, MD   30 mL at 05/06/20 2007  . metoCLOPramide (REGLAN) tablet 5-10 mg  5-10 mg Oral Q8H PRN Poggi, Excell Seltzer, MD       Or  . metoCLOPramide (REGLAN) injection 5-10 mg  5-10 mg Intravenous Q8H PRN Poggi, Excell Seltzer, MD      . morphine 2 MG/ML injection 0.5 mg  0.5 mg Intravenous Q2H PRN Poggi, Excell Seltzer,  MD      . multivitamin with minerals tablet 1 tablet  1 tablet Oral Daily Cipriano Bunker, MD   1 tablet at 05/06/20 0959  . ondansetron (ZOFRAN) tablet 4 mg  4 mg Oral Q6H PRN Poggi, Excell Seltzer, MD       Or  . ondansetron (ZOFRAN) injection 4 mg  4 mg Intravenous Q6H PRN Poggi, Excell Seltzer, MD      . phosphorus (K PHOS NEUTRAL) tablet 250 mg  250 mg Oral TID Cipriano Bunker, MD   250 mg at 05/06/20 2007  . risperiDONE (RISPERDAL) tablet 0.5 mg  0.5 mg Oral BID Cipriano Bunker, MD   0.5 mg at 05/06/20 2007  . sodium phosphate (FLEET) 7-19 GM/118ML enema 1 enema  1 enema Rectal Once PRN Poggi, Excell Seltzer, MD      . traMADol Janean Sark) tablet 50 mg  50 mg Oral Q6H PRN Poggi, Excell Seltzer, MD   50 mg at 05/06/20 1717     Discharge Medications: Please see discharge summary for a list of discharge medications.  Relevant Imaging Results:  Relevant Lab Results:   Additional Information SS: [id number]  Maud Deed, LCSW

## 2020-05-07 NOTE — Progress Notes (Signed)
PROGRESS NOTE    Megan Carpenter  WGN:[id number] DOB: 05-11-1928 DOA: 05/04/2020 PCP: Smitty Cords, DO   Brief Narrative:  This 87 years old female with PMH significant for GERD, hypertension, hypothyroidism, chronic dementia,  Alzheimer's type, progressive cognitive decline with history of sundowning on risperidone 0.25 mg was sent from nursing home status post fall. History is obtained from daughter who reports after she called the facility and she was told that her mom has slipped in the bathroom and had a left hip pain. She denies any LOC. X-ray hip shows displaced left femoral neck fracture. Orthopeadics consulted,  Patient underwent left hip hemiarthroplasty , tolerated well.  Postoperative day 2.   Assessment & Plan:   Active Problems:   Hip fx (HCC)  Displaced subcapital fracture of the left femoral neck: Patient presented status post fall in a nursing home. X-ray hip shows displaced subcapital fracture of left femoral neck. Continue hip fracture protocol. Orthopedic consulted, status post left hip hemiarthroplasty,  Postoperative day 2. She reports pain is controlled.  Adequate pain control PT and OT evaluation. Recommended weightbearing as tolerated on left leg, continue physical therapy. She probably needs rehab after discharge.  Pending insurance authorization.  Acute Sinusitis  Noted on CT scan.  Continue Z-Pak.  Hyperglycemia Hb A1c 6.4 monitor fingersticks.  GERD Stable.  HTN: BP improved. Hydralazine as needed  Hypothyroidism Continue synthroid  Chronic dementia, Alzheimer's type progressive decline with history of sundowning  Continue  Risperidone 0.25mg   Per last pcp note for behavioral disturbances and sleep Home dose of Risperdal 0.5 mg twice daily resumed   DVT prophylaxis: SCDs Code Status: Full code Family Communication:  No family at bed side. Disposition Plan:   Status is: Inpatient  Remains inpatient appropriate  because:Inpatient level of care appropriate due to severity of illness   Dispo: The patient is from: SNF              Anticipated d/c is to: SNF              Anticipated d/c date is: > 3 days              Patient currently is not medically stable to d/c.   Consultants:   Orthopedics  Procedures: Left hip hemiarthroplasty.    Antimicrobials:  Anti-infectives (From admission, onward)   Start     Dose/Rate Route Frequency Ordered Stop   05/05/20 1500  ceFAZolin (ANCEF) IVPB 2g/100 mL premix        2 g 200 mL/hr over 30 Minutes Intravenous Every 6 hours 05/05/20 1318 05/06/20 0606   05/05/20 0600  ceFAZolin (ANCEF) IVPB 2g/100 mL premix        2 g 200 mL/hr over 30 Minutes Intravenous 30 min pre-op 05/04/20 1440 05/05/20 0957   05/04/20 1930  azithromycin (ZITHROMAX) tablet 500 mg        500 mg Oral Daily 05/04/20 1930 05/09/20 0959   05/04/20 1402  ceFAZolin (ANCEF) IVPB 2g/100 mL premix  Status:  Discontinued        2 g 200 mL/hr over 30 Minutes Intravenous 30 min pre-op 05/04/20 1404 05/04/20 1440      Subjective: Patient was seen and examined at bedside. Overnight events noted.  Patient is  s/p left hip hemiarthroplasty, tolerated well.  Postoperative day 2. Patient seems much improved, alert and oriented,  much calmer after resuming her home dose of risperdal.  Objective: Vitals:   05/06/20 1917 05/07/20 0011 05/07/20 0359 05/07/20 0840  BP: (!) 106/59 139/66 110/67 134/67  Pulse: 76 (!) 102 78 82  Resp: 18 18 18 18   Temp: 98.8 F (37.1 C) 97.6 F (36.4 C) 98.9 F (37.2 C) 98.3 F (36.8 C)  TempSrc: Oral Oral Oral   SpO2: 93% 91% 92% 91%  Weight:      Height:        Intake/Output Summary (Last 24 hours) at 05/07/2020 1103 Last data filed at 05/07/2020 1011 Gross per 24 hour  Intake 477 ml  Output --  Net 477 ml   Filed Weights   05/04/20 0855  Weight: 65.8 kg    Examination:  General exam: Appears calm and comfortable, not in any acute  distress Respiratory system: Clear to auscultation. Respiratory effort normal. Cardiovascular system: S1 & S2 heard, RRR. No JVD, murmurs, rubs, gallops or clicks. No pedal edema. Gastrointestinal system: Abdomen is nondistended, soft and nontender. No organomegaly or masses felt. Normal bowel sounds heard. Central nervous system: Alert and oriented x 2. No focal neurological deficits. Extremities: No edema, no cyanosis, no clubbing.  Left hip tenderness Skin: No rashes, lesions or ulcers Psychiatry: Judgement and insight appear normal. Mood & affect appropriate.     Data Reviewed: I have personally reviewed following labs and imaging studies  CBC: Recent Labs  Lab 05/04/20 0900 05/05/20 0329 05/06/20 0615 05/07/20 0604  WBC 18.5* 17.4* 17.5* 12.5*  HGB 14.7 14.1 12.7 11.5*  HCT 45.5 41.9 37.9 34.9*  MCV 90.8 88.2 89.2 89.9  PLT 194 186 164 144*   Basic Metabolic Panel: Recent Labs  Lab 05/04/20 0900 05/05/20 0329 05/06/20 0615 05/07/20 0604  NA 138 138 138 139  K 4.3 4.1 4.1 4.1  CL 102 102 108 106  CO2 24 25 23 26   GLUCOSE 235* 198* 162* 155*  BUN 20 16 19 15   CREATININE 0.92 0.65 0.61 0.54  CALCIUM 9.1 8.8* 8.1* 7.9*  MG  --   --  2.0  --   PHOS  --   --  2.1*  --    GFR: Estimated Creatinine Clearance: 44.5 mL/min (by C-G formula based on SCr of 0.54 mg/dL). Liver Function Tests: No results for input(s): AST, ALT, ALKPHOS, BILITOT, PROT, ALBUMIN in the last 168 hours. No results for input(s): LIPASE, AMYLASE in the last 168 hours. No results for input(s): AMMONIA in the last 168 hours. Coagulation Profile: No results for input(s): INR, PROTIME in the last 168 hours. Cardiac Enzymes: No results for input(s): CKTOTAL, CKMB, CKMBINDEX, TROPONINI in the last 168 hours. BNP (last 3 results) No results for input(s): PROBNP in the last 8760 hours. HbA1C: Recent Labs    05/04/20 2107  HGBA1C 6.4*   CBG: Recent Labs  Lab 05/05/20 1657 05/05/20 2107  05/06/20 0748 05/06/20 2138 05/07/20 0843  GLUCAP 242* 181* 161* 203* 157*   Lipid Profile: No results for input(s): CHOL, HDL, LDLCALC, TRIG, CHOLHDL, LDLDIRECT in the last 72 hours. Thyroid Function Tests: Recent Labs    05/04/20 2107  TSH 3.829   Anemia Panel: No results for input(s): VITAMINB12, FOLATE, FERRITIN, TIBC, IRON, RETICCTPCT in the last 72 hours. Sepsis Labs: No results for input(s): PROCALCITON, LATICACIDVEN in the last 168 hours.  Recent Results (from the past 240 hour(s))  SARS Coronavirus 2 by RT PCR (hospital order, performed in [facility] hospital lab) Nasopharyngeal Nasopharyngeal Swab     Status: None   Collection Time: 05/04/20  2:09 PM   Specimen: Nasopharyngeal Swab  Result Value Ref Range Status  SARS Coronavirus 2 NEGATIVE NEGATIVE Final    Comment: (NOTE) SARS-CoV-2 target nucleic acids are NOT DETECTED.  The SARS-CoV-2 RNA is generally detectable in upper and lower respiratory specimens during the acute phase of infection. The lowest concentration of SARS-CoV-2 viral copies this assay can detect is 250 copies / mL. A negative result does not preclude SARS-CoV-2 infection and should not be used as the sole basis for treatment or other patient management decisions.  A negative result may occur with improper specimen collection / handling, submission of specimen other than nasopharyngeal swab, presence of viral mutation(s) within the areas targeted by this assay, and inadequate number of viral copies (<250 copies / mL). A negative result must be combined with clinical observations, patient history, and epidemiological information.  Fact Sheet for Patients:   BoilerBrush.com.cy  Fact Sheet for Healthcare Providers: https://pope.com/  This test is not yet approved or  cleared by the [place] FDA and has been authorized for detection and/or diagnosis of SARS-CoV-2 by FDA under an Emergency  Use Authorization (EUA).  This EUA will remain in effect (meaning this test can be used) for the duration of the COVID-19 declaration under Section 564(b)(1) of the Act, 21 U.S.C. section 360bbb-3(b)(1), unless the authorization is terminated or revoked sooner.  Performed at [facility], [address] [place], [place] [address]   MRSA PCR Screening     Status: None   Collection Time: 05/05/20  1:53 AM   Specimen: Nasopharyngeal  Result Value Ref Range Status   MRSA by PCR NEGATIVE NEGATIVE Final    Comment:        The GeneXpert MRSA Assay (FDA approved for NASAL specimens only), is one component of a comprehensive MRSA colonization surveillance program. It is not intended to diagnose MRSA infection nor to guide or monitor treatment for MRSA infections. Performed at [facility], [address] [place], [place] [address]     Radiology Studies: DG HIP UNILAT W OR W/O PELVIS 2-3 VIEWS LEFT  Result Date: 05/05/2020 CLINICAL DATA:  Status post total hip replacement on the left EXAM: DG HIP (WITH OR WITHOUT PELVIS) 2-3V LEFT COMPARISON:  May 04, 2020. FINDINGS: Frontal pelvis and lateral left hip images were obtained. Patient is status post total hip replacement on the left with prosthetic components well-seated. No acute fracture or dislocation. Moderate narrowing right hip joint. IMPRESSION: Total hip replacement on the left with prosthetic components well-seated. No acute fracture or dislocation. Narrowing right hip joint. Electronically Signed   By: Bretta Bang III M.D.   On: 05/05/2020 12:12   Scheduled Meds: . azithromycin  500 mg Oral Daily  . docusate sodium  100 mg Oral BID  . enoxaparin (LOVENOX) injection  40 mg Subcutaneous Q24H  . feeding supplement  237 mL Oral BID BM  . levothyroxine  100 mcg Oral Q0600  . multivitamin with minerals  1 tablet Oral Daily  . phosphorus  250 mg Oral TID  . risperiDONE  0.5 mg Oral BID   Continuous  Infusions:    LOS: 3 days    Time spent: 25 mins    Cipriano Bunker, MD [facility]   If 7PM-7AM, please contact night-coverage

## 2020-05-07 NOTE — Progress Notes (Signed)
  Subjective: 2 Days Post-Op Procedure(s) (LRB): ARTHROPLASTY BIPOLAR HIP (HEMIARTHROPLASTY) (Left) Patient reports pain as mild.   Patient is well, and has had no acute complaints or problems Plan is to go Rehab after hospital stay. Negative for chest pain and shortness of breath Fever: no Gastrointestinal: Negative for nausea and vomiting  Objective: Vital signs in last 24 hours: Temp:  [97.6 F (36.4 C)-99 F (37.2 C)] 98.9 F (37.2 C) (01/23 0359) Pulse Rate:  [76-102] 78 (01/23 0359) Resp:  [16-18] 18 (01/23 0359) BP: (106-139)/(59-67) 110/67 (01/23 0359) SpO2:  [91 %-94 %] 92 % (01/23 0359)  Intake/Output from previous day: No intake or output data in the 24 hours ending 05/07/20 0821  Intake/Output this shift: No intake/output data recorded.  Labs: Recent Labs    05/04/20 0900 05/05/20 0329 05/06/20 0615 05/07/20 0604  HGB 14.7 14.1 12.7 11.5*   Recent Labs    05/06/20 0615 05/07/20 0604  WBC 17.5* 12.5*  RBC 4.25 3.88  HCT 37.9 34.9*  PLT 164 144*   Recent Labs    05/06/20 0615 05/07/20 0604  NA 138 139  K 4.1 4.1  CL 108 106  CO2 23 26  BUN 19 15  CREATININE 0.61 0.54  GLUCOSE 162* 155*  CALCIUM 8.1* 7.9*   No results for input(s): LABPT, INR in the last 72 hours.   EXAM General - Patient is Alert and Oriented Extremity - Neurovascular intact Sensation intact distally Dorsiflexion/Plantar flexion intact Compartment soft Dressing/Incision - clean, dry, no drainage Motor Function - intact, moving foot and toes well on exam.   Past Medical History:  Diagnosis Date  . GERD (gastroesophageal reflux disease)   . Hypertension   . Thyroid disease     Assessment/Plan: 2 Days Post-Op Procedure(s) (LRB): ARTHROPLASTY BIPOLAR HIP (HEMIARTHROPLASTY) (Left) Active Problems:   Hip fx (HCC)  Estimated body mass index is 24.89 kg/m as calculated from the following:   Height as of this encounter: 5\' 4"  (1.626 m).   Weight as of this  encounter: 65.8 kg. Advance diet Up with therapy D/C IV fluids Discharge to SNF when cleared by medicine.  Follow-up at [facility] clinic orthopedics in 2 weeks for staple removal Hemoglobin 12.7 and stable.  DVT Prophylaxis - Lovenox, Foot Pumps and TED hose Weight-Bearing as tolerated to left leg  [facility], PA-C Orthopaedic Surgery 05/07/2020, 8:21 AM

## 2020-05-07 NOTE — TOC Initial Note (Signed)
Transition of Care [facility]) - Initial/Assessment Note    Patient Details  Name: Megan Carpenter MRN: [id number] Date of Birth: 02-Feb-1929  [facility]) CM/SW Contact:    Maud Deed, LCSW Phone Number: 05/07/2020, 7:21 PM  Clinical Narrative:                 CSW spoke with pt's daughter regarding discharge plan. Vickie was agreeable to pt going to SNF but does not want her going to [facility]. Pt is from [facility]. CSW completed FL2 and PASRR and faxed to surrounding facilities.   Expected Discharge Plan: Skilled Nursing Facility Barriers to Discharge: Continued Medical Work up   Patient Goals and CMS Choice Patient states their goals for this hospitalization and ongoing recovery are:: daughter states to be well enough to go back to [facility] CMS Medicare.gov Compare Post Acute Care list provided to:: Patient Represenative (must comment) Choice offered to / list presented to : Adult Children  Expected Discharge Plan and Services Expected Discharge Plan: Skilled Nursing Facility       Living arrangements for the past 2 months: Assisted Living Facility ([facility])                                      Prior Living Arrangements/Services Living arrangements for the past 2 months: Assisted Living Facility ([facility]) Lives with:: Facility Resident Patient language and need for interpreter reviewed:: Yes        Need for Family Participation in Patient Care: Yes (Comment) Care giver support system in place?: Yes (comment)   Criminal Activity/Legal Involvement Pertinent to Current Situation/Hospitalization: No - Comment as needed  Activities of Daily Living Home Assistive Devices/Equipment: None    Permission Sought/Granted Permission sought to share information with : Facility Industrial/product designer granted to share information with : Yes, Verbal Permission Granted  Share Information with NAME: Vickie     Permission granted to  share info w Relationship: daughter  Permission granted to share info w Contact Information: [phone]  Emotional Assessment Appearance:: Other (Comment Required (unable to assess) Attitude/Demeanor/Rapport: Unable to Assess Affect (typically observed): Unable to Assess Orientation: : Oriented to Self Alcohol / Substance Use: Not Applicable Psych Involvement: No (comment)  Admission diagnosis:  Hip fx (HCC) [S72.009A] Fall [W19.XXXA] Closed fracture of left hip, initial encounter [facility]) [S72.002A] Patient Active Problem List   Diagnosis Date Noted  . Hip fx (HCC) 05/04/2020  . Palliative care patient 08/03/2018  . Severe protein-calorie malnutrition (HCC) 07/02/2016  . Impacted cerumen of both ears 03/13/2016  . Hearing reduced, bilateral 03/13/2016  . Late onset Alzheimer's disease with behavioral disturbance (HCC) 11/27/2015  . Elevated hemoglobin A1c 03/28/2015  . GERD (gastroesophageal reflux disease) 03/28/2015  . Hypertension 03/28/2015  . Hypothyroidism 03/28/2015  . Osteoporosis 03/28/2015   PCP:  Smitty Cords, DO Pharmacy:   [facility] [place] [facility] [address] [place] - [address] [place] [address] Phone: [phone] Fax: [phone]  [facility] [phone] [place], [place] - [address] [place] [address] Phone: [phone] Fax: [phone]  [id number] - [place], [place] - [address] [place] [address] Phone: [phone] Fax: [phone]  [facility] - [place], [place] - [address] [place] [address] Phone: [phone] Fax: [phone]  Social Determinants of Health (SDOH) Interventions    Readmission Risk Interventions No flowsheet data found.

## 2020-05-08 ENCOUNTER — Encounter: Payer: Self-pay | Admitting: Surgery

## 2020-05-08 LAB — BASIC METABOLIC PANEL
Anion gap: 9 (ref 5–15)
BUN: 13 mg/dL (ref 8–23)
CO2: 26 mmol/L (ref 22–32)
Calcium: 7.7 mg/dL — ABNORMAL LOW (ref 8.9–10.3)
Chloride: 103 mmol/L (ref 98–111)
Creatinine, Ser: 0.61 mg/dL (ref 0.44–1.00)
GFR, Estimated: >60 mL/min (ref >60)
Glucose, Bld: 159 mg/dL — ABNORMAL HIGH (ref 70–99)
Potassium: 4.1 mmol/L (ref 3.5–5.1)
Sodium: 138 mmol/L (ref 135–145)

## 2020-05-08 LAB — GLUCOSE, CAPILLARY
Glucose-Capillary: 156 mg/dL — ABNORMAL HIGH (ref 70–99)
Glucose-Capillary: 134 mg/dL — ABNORMAL HIGH (ref 70–99)
Glucose-Capillary: 252 mg/dL — ABNORMAL HIGH (ref 70–99)
Glucose-Capillary: 184 mg/dL — ABNORMAL HIGH (ref 70–99)

## 2020-05-08 NOTE — Care Management Important Message (Signed)
Important Message  Patient Details  Name: Megan Carpenter MRN: [id number] Date of Birth: [birth year]   Medicare Important Message Given:  Yes     Olegario Messier A Rebecah Dangerfield 05/08/2020, 12:29 PM

## 2020-05-08 NOTE — Progress Notes (Signed)
PROGRESS NOTE    POPPY MCAFEE  GYF:[id number] DOB: 10-Jun-1928 DOA: 05/04/2020 PCP: Smitty Cords, DO   Brief Narrative:  This 87 years old female with PMH significant for GERD, hypertension, hypothyroidism, chronic dementia,  Alzheimer's type, progressive cognitive decline with history of sundowning on risperidone 0.25 mg was sent from nursing home status post fall. History is obtained from daughter who reports after she called the facility and she was told that her mom has slipped in the bathroom and had a left hip pain. She denies any LOC. X-ray hip shows displaced left femoral neck fracture. Orthopeadics consulted,  Patient underwent left hip hemiarthroplasty , tolerated well.  Postoperative day 3.  Assessment & Plan:   Active Problems:   Hip fx (HCC)  Displaced subcapital fracture of the left femoral neck: Patient presented status post fall in a nursing home. X-ray hip shows displaced subcapital fracture of left femoral neck. Continue hip fracture protocol. Orthopedic consulted, status post left hip hemiarthroplasty,  Postoperative day 3. She reports pain is controlled.  Adequate pain control PT and OT evaluation. Recommended weightbearing as tolerated on left leg, continue physical therapy. She probably needs rehab after discharge.  Pending insurance authorization.  Acute Sinusitis  Noted on CT scan.  Continue Z-Pak.  Hyperglycemia Hb A1c 6.4 monitor fingersticks.  GERD Stable.  HTN: BP improved. Hydralazine as needed  Hypothyroidism Continue synthroid  Chronic dementia, Alzheimer's type progressive decline with history of sundowning  Continue  Risperidone 0.25mg   Per last pcp note for behavioral disturbances and sleep Home dose of Risperdal 0.5 mg twice daily resumed   DVT prophylaxis:  Lovenox Code Status: Full code Family Communication:  No family at bed side. Disposition Plan:   Status is: Inpatient  Remains inpatient appropriate  because:Inpatient level of care appropriate due to severity of illness   Dispo: The patient is from: SNF              Anticipated d/c is to: SNF              Anticipated d/c date is:  1-2 days              Patient currently is not medically stable to d/c.   Consultants:   Orthopedics  Procedures: Left hip hemiarthroplasty.    Antimicrobials:  Anti-infectives (From admission, onward)   Start     Dose/Rate Route Frequency Ordered Stop   05/05/20 1500  ceFAZolin (ANCEF) IVPB 2g/100 mL premix        2 g 200 mL/hr over 30 Minutes Intravenous Every 6 hours 05/05/20 1318 05/06/20 0606   05/05/20 0600  ceFAZolin (ANCEF) IVPB 2g/100 mL premix        2 g 200 mL/hr over 30 Minutes Intravenous 30 min pre-op 05/04/20 1440 05/05/20 0957   05/04/20 1930  azithromycin (ZITHROMAX) tablet 500 mg        500 mg Oral Daily 05/04/20 1930 05/08/20 0926   05/04/20 1402  ceFAZolin (ANCEF) IVPB 2g/100 mL premix  Status:  Discontinued        2 g 200 mL/hr over 30 Minutes Intravenous 30 min pre-op 05/04/20 1404 05/04/20 1440      Subjective: Patient was seen and examined at bedside. Overnight events noted.  Patient is s/p left hip hemiarthroplasty, tolerated well.  Postoperative day 3. Patient seems much improved, alert and oriented, she reports having a bowel movement yesterday. She appears at her baseline mental status.  Objective: Vitals:   05/08/20 0101 05/08/20 0429 05/08/20  3710 05/08/20 1124  BP: 131/72 (!) 125/59 (!) 141/84 (!) 144/79  Pulse: 85 80 86 (!) 103  Resp: 18 16 18 20   Temp: 98.3 F (36.8 C) 98.2 F (36.8 C) 97.8 F (36.6 C) 98.1 F (36.7 C)  TempSrc: Oral Oral Oral   SpO2: 92% 93% 93% 96%  Weight:      Height:        Intake/Output Summary (Last 24 hours) at 05/08/2020 1337 Last data filed at 05/08/2020 1000 Gross per 24 hour  Intake 780 ml  Output --  Net 780 ml   Filed Weights   05/04/20 0855  Weight: 65.8 kg    Examination:  General exam: Appears calm and  comfortable, not in any acute distress Respiratory system: Clear to auscultation. Respiratory effort normal. Cardiovascular system: S1 & S2 heard, RRR. No JVD, murmurs, rubs, gallops or clicks. No pedal edema. Gastrointestinal system: Abdomen is nondistended, soft and nontender. No organomegaly or masses felt. Normal bowel sounds heard. Central nervous system: Alert and oriented x 2. No focal neurological deficits. Extremities: No edema, no cyanosis, no clubbing.  Left hip tenderness> improved Skin: No rashes, lesions or ulcers Psychiatry: Judgement and insight appear normal. Mood & affect appropriate.     Data Reviewed: I have personally reviewed following labs and imaging studies  CBC: Recent Labs  Lab 05/04/20 0900 05/05/20 0329 05/06/20 0615 05/07/20 0604  WBC 18.5* 17.4* 17.5* 12.5*  HGB 14.7 14.1 12.7 11.5*  HCT 45.5 41.9 37.9 34.9*  MCV 90.8 88.2 89.2 89.9  PLT 194 186 164 144*   Basic Metabolic Panel: Recent Labs  Lab 05/04/20 0900 05/05/20 0329 05/06/20 0615 05/07/20 0604 05/08/20 0345  NA 138 138 138 139 138  K 4.3 4.1 4.1 4.1 4.1  CL 102 102 108 106 103  CO2 24 25 23 26 26   GLUCOSE 235* 198* 162* 155* 159*  BUN 20 16 19 15 13   CREATININE 0.92 0.65 0.61 0.54 0.61  CALCIUM 9.1 8.8* 8.1* 7.9* 7.7*  MG  --   --  2.0  --   --   PHOS  --   --  2.1*  --   --    GFR: Estimated Creatinine Clearance: 44.5 mL/min (by C-G formula based on SCr of 0.61 mg/dL). Liver Function Tests: No results for input(s): AST, ALT, ALKPHOS, BILITOT, PROT, ALBUMIN in the last 168 hours. No results for input(s): LIPASE, AMYLASE in the last 168 hours. No results for input(s): AMMONIA in the last 168 hours. Coagulation Profile: No results for input(s): INR, PROTIME in the last 168 hours. Cardiac Enzymes: No results for input(s): CKTOTAL, CKMB, CKMBINDEX, TROPONINI in the last 168 hours. BNP (last 3 results) No results for input(s): PROBNP in the last 8760 hours. HbA1C: No results  for input(s): HGBA1C in the last 72 hours. CBG: Recent Labs  Lab 05/07/20 1232 05/07/20 1607 05/07/20 2102 05/08/20 0827 05/08/20 1125  GLUCAP 156* 138* 156* 134* 252*   Lipid Profile: No results for input(s): CHOL, HDL, LDLCALC, TRIG, CHOLHDL, LDLDIRECT in the last 72 hours. Thyroid Function Tests: No results for input(s): TSH, T4TOTAL, FREET4, T3FREE, THYROIDAB in the last 72 hours. Anemia Panel: No results for input(s): VITAMINB12, FOLATE, FERRITIN, TIBC, IRON, RETICCTPCT in the last 72 hours. Sepsis Labs: No results for input(s): PROCALCITON, LATICACIDVEN in the last 168 hours.  Recent Results (from the past 240 hour(s))  SARS Coronavirus 2 by RT PCR (hospital order, performed in [facility] hospital lab) Nasopharyngeal Nasopharyngeal Swab  Status: None   Collection Time: 05/04/20  2:09 PM   Specimen: Nasopharyngeal Swab  Result Value Ref Range Status   SARS Coronavirus 2 NEGATIVE NEGATIVE Final    Comment: (NOTE) SARS-CoV-2 target nucleic acids are NOT DETECTED.  The SARS-CoV-2 RNA is generally detectable in upper and lower respiratory specimens during the acute phase of infection. The lowest concentration of SARS-CoV-2 viral copies this assay can detect is 250 copies / mL. A negative result does not preclude SARS-CoV-2 infection and should not be used as the sole basis for treatment or other patient management decisions.  A negative result may occur with improper specimen collection / handling, submission of specimen other than nasopharyngeal swab, presence of viral mutation(s) within the areas targeted by this assay, and inadequate number of viral copies (<250 copies / mL). A negative result must be combined with clinical observations, patient history, and epidemiological information.  Fact Sheet for Patients:   BoilerBrush.com.cy  Fact Sheet for Healthcare Providers: https://pope.com/  This test is not yet  approved or  cleared by the [place] FDA and has been authorized for detection and/or diagnosis of SARS-CoV-2 by FDA under an Emergency Use Authorization (EUA).  This EUA will remain in effect (meaning this test can be used) for the duration of the COVID-19 declaration under Section 564(b)(1) of the Act, 21 U.S.C. section 360bbb-3(b)(1), unless the authorization is terminated or revoked sooner.  Performed at [facility], [address] [place], [place] [address]   MRSA PCR Screening     Status: None   Collection Time: 05/05/20  1:53 AM   Specimen: Nasopharyngeal  Result Value Ref Range Status   MRSA by PCR NEGATIVE NEGATIVE Final    Comment:        The GeneXpert MRSA Assay (FDA approved for NASAL specimens only), is one component of a comprehensive MRSA colonization surveillance program. It is not intended to diagnose MRSA infection nor to guide or monitor treatment for MRSA infections. Performed at [facility], [address] [place], [place] [address]     Radiology Studies: No results found. Scheduled Meds: . docusate sodium  100 mg Oral BID  . enoxaparin (LOVENOX) injection  40 mg Subcutaneous Q24H  . feeding supplement  237 mL Oral BID BM  . levothyroxine  100 mcg Oral Q0600  . multivitamin with minerals  1 tablet Oral Daily  . risperiDONE  0.5 mg Oral BID   Continuous Infusions:    LOS: 4 days    Time spent: 25 mins    Cipriano Bunker, MD [facility]   If 7PM-7AM, please contact night-coverage

## 2020-05-08 NOTE — Progress Notes (Signed)
Physical Therapy Treatment Patient Details Name: Megan Carpenter MRN: [id number] DOB: 08-May-1928 Today's Date: 05/08/2020    History of Present Illness Pt is an 87 y/o F who presented s/p mechanical fall in bathroom and L hip pain at [facility] (PLOF information obtained from pt's DTR in ED).  Pt found to have L displaced subcapital fracture of femoral neck. Now s/p L hemiarthroplasty with posterior hip precautions.  PMHx:  GERD, HTN, thyroid disease, Alz dementia, sundowning.    PT Comments    Pleasantly confused.  Supine AAROM.  To EOB with mod a x 1.  Steady in sitting with supervision.  She is able to stand pivot to commode with mod a x 1, void then continue on to recliner.  Overall transfer improved today but remains unable to take much weight for long on LLE before bucking.   Follow Up Recommendations  SNF     Equipment Recommendations  None recommended by PT    Recommendations for Other Services       Precautions / Restrictions Precautions Precautions: Fall;Posterior Hip Precaution Booklet Issued: No (pt did not remember instructions in a short time of checking recall) Precaution Comments: posterior hip Restrictions Weight Bearing Restrictions: Yes LLE Weight Bearing: Weight bearing as tolerated Other Position/Activity Restrictions: posterior precautions    Mobility  Bed Mobility Overal bed mobility: Needs Assistance Bed Mobility: Supine to Sit     Supine to sit: Mod assist        Transfers Overall transfer level: Needs assistance Equipment used: 1 person hand held assist Transfers: Sit to/from [employer] Sit to Stand: Min assist Stand pivot transfers: Mod assist       General transfer comment: increased ability to step and pivot feet today  Ambulation/Gait             General Gait Details: unable to walk   Stairs             Wheelchair Mobility    Modified Rankin (Stroke Patients Only)       Balance Overall  balance assessment: Needs assistance Sitting-balance support: Feet supported Sitting balance-Leahy Scale: Fair     Standing balance support: Bilateral upper extremity supported;During functional activity Standing balance-Leahy Scale: Poor                              Cognition Arousal/Alertness: Awake/alert Behavior During Therapy: WFL for tasks assessed/performed Overall Cognitive Status: No family/caregiver present to determine baseline cognitive functioning                                        Exercises Other Exercises Other Exercises: to commode to void    General Comments        Pertinent Vitals/Pain Pain Assessment: Faces Faces Pain Scale: Hurts a little bit Pain Location: L hip with attempts to mobilize Pain Descriptors / Indicators: Grimacing Pain Intervention(s): Limited activity within patient's tolerance;Monitored during session;Repositioned    Home Living                      Prior Function            PT Goals (current goals can now be found in the care plan section) Progress towards PT goals: Progressing toward goals    Frequency    BID      PT  Plan Current plan remains appropriate    Co-evaluation              AM-PAC PT "6 Clicks" Mobility   Outcome Measure  Help needed turning from your back to your side while in a flat bed without using bedrails?: A Lot Help needed moving from lying on your back to sitting on the side of a flat bed without using bedrails?: A Lot Help needed moving to and from a bed to a chair (including a wheelchair)?: A Lot Help needed standing up from a chair using your arms (e.g., wheelchair or bedside chair)?: A Little Help needed to walk in hospital room?: Total Help needed climbing 3-5 steps with a railing? : Total 6 Click Score: 11    End of Session Equipment Utilized During Treatment: Gait belt Activity Tolerance: Patient tolerated treatment well Patient left: in  chair;with call bell/phone within reach;with chair alarm set Nurse Communication: Mobility status;Other (comment) Pain - Right/Left: Right Pain - part of body: Hip     Time: 0786-7544 PT Time Calculation (min) (ACUTE ONLY): 23 min  Charges:  $Therapeutic Exercise: 8-22 mins $Therapeutic Activity: 8-22 mins                    Danielle Dess, PTA 05/08/20, 9:58 AM

## 2020-05-08 NOTE — Progress Notes (Signed)
Occupational Therapy Treatment Patient Details Name: Megan Carpenter MRN: [id number] DOB: [birth year] Today's Date: 05/08/2020    History of present illness Pt is an 87 y/o F who presented s/p mechanical fall in bathroom and L hip pain at [facility] (PLOF information obtained from pt's DTR in ED).  Pt found to have L displaced subcapital fracture of femoral neck. Now s/p L hemiarthroplasty with posterior hip precautions.  PMHx:  GERD, HTN, thyroid disease, Alz dementia, sundowning.   OT comments  Pt seen for OT treatment this date to f/u re: safety with ADLs/ADL mobility. OT engages pt in standing grooming ADLs wtih SETUP and MIN A/CGA for standing balance from BST. Pt requires MIN A with RW for STS. Cues throughout for safety. Requires completing 4 tasks in 2 standing bouts for seated rest break. Somewhat more anxious today, RN notifed.  Will continue to follow acutely. Continue to recommend SNF as safest d/c.    Follow Up Recommendations  SNF;Supervision/Assistance - 24 hour    Equipment Recommendations  Other (comment) (defer)    Recommendations for Other Services      Precautions / Restrictions Precautions Precautions: Fall;Posterior Hip Precaution Booklet Issued: No (pt did not remember instructions in a short time of checking recall) Precaution Comments: posterior hip Restrictions Weight Bearing Restrictions: Yes LLE Weight Bearing: Weight bearing as tolerated Other Position/Activity Restrictions: posterior precautions       Mobility Bed Mobility   General bed mobility comments: up to chair pre/post  Transfers Overall transfer level: Needs assistance Equipment used: Rolling walker (2 wheeled) Transfers: Sit to/from Stand Sit to Stand: Min assist         General transfer comment: cues for safety.    Balance Overall balance assessment: Needs assistance Sitting-balance support: Feet supported Sitting balance-Leahy Scale: Fair     Standing balance support:  Bilateral upper extremity supported;During functional activity Standing balance-Leahy Scale: Poor                             ADL either performed or assessed with clinical judgement   ADL Overall ADL's : Needs assistance/impaired     Grooming: Wash/dry face;Oral care;Applying deodorant;Wash/dry hands;Standing;Minimal assistance;Min guard Grooming Details (indicate cue type and reason): with SETUP on bed side table in front of chair. Pt noted to be bracing on chair to stand. Also with RW for UE support.                                     Vision       Perception     Praxis      Cognition Arousal/Alertness: Awake/alert Behavior During Therapy: WFL for tasks assessed/performed;Anxious Overall Cognitive Status: No family/caregiver present to determine baseline cognitive functioning                                 General Comments: pt somewhat more anxious today, doesn't want OT to leave. She follows simple one step commands well.        Exercises Other Exercises Other Exercises: OT engages pt in standing grooming ADLs wtih SETUP and MIN A/CGA for standing balance from BST. Pt requires MIN A with RW for STS. Cues throughout for safety. Requires completing 4 tasks in 2 standing bouts for seated rest break. Somewhat more anxious today, RN notifed.  Shoulder Instructions       General Comments      Pertinent Vitals/ Pain       Pain Assessment: Faces Faces Pain Scale: Hurts little more Pain Location: L hip with attempts to mobilize Pain Descriptors / Indicators: Grimacing Pain Intervention(s): Limited activity within patient's tolerance;Monitored during session  Home Living                                          Prior Functioning/Environment              Frequency  Min 1X/week        Progress Toward Goals  OT Goals(current goals can now be found in the care plan section)  Progress towards OT  goals: Progressing toward goals  Acute Rehab OT Goals Patient Stated Goal: none stated OT Goal Formulation: Patient unable to participate in goal setting Time For Goal Achievement: 05/20/20 Potential to Achieve Goals: Fair  Plan Discharge plan remains appropriate    Co-evaluation                 AM-PAC OT "6 Clicks" Daily Activity     Outcome Measure   Help from another person eating meals?: None Help from another person taking care of personal grooming?: A Little Help from another person toileting, which includes using toliet, bedpan, or urinal?: A Lot Help from another person bathing (including washing, rinsing, drying)?: A Lot Help from another person to put on and taking off regular upper body clothing?: A Little Help from another person to put on and taking off regular lower body clothing?: A Lot 6 Click Score: 16    End of Session Equipment Utilized During Treatment: Gait belt;Rolling walker  OT Visit Diagnosis: Unsteadiness on feet (R26.81);Muscle weakness (generalized) (M62.81)   Activity Tolerance Patient tolerated treatment well   Patient Left in bed;with call bell/phone within reach;with bed alarm set   Nurse Communication Other (comment) (notified that pt more anxious)        Time: 7867-6720 OT Time Calculation (min): 48 min  Charges: OT General Charges $OT Visit: 1 Visit OT Treatments $Self Care/Home Management : 23-37 mins $Therapeutic Activity: 8-22 mins  Rejeana Brock, MS, OTR/L ascom [phone] 05/08/20, 3:27 PM

## 2020-05-08 NOTE — Progress Notes (Signed)
Subjective: 3 Days Post-Op Procedure(s) (LRB): ARTHROPLASTY BIPOLAR HIP (HEMIARTHROPLASTY) (Left) Patient reports pain as mild.   Patient is well, and has had no acute complaints or problems Plan is to go Rehab after hospital stay. Negative for chest pain and shortness of breath Fever: no Gastrointestinal: Negative for nausea and vomiting Patient denies passing any gas this morning but she has had a BM following surgery.  Objective: Vital signs in last 24 hours: Temp:  [98.2 F (36.8 C)-98.8 F (37.1 C)] 98.2 F (36.8 C) (01/24 0429) Pulse Rate:  [80-85] 80 (01/24 0429) Resp:  [16-18] 16 (01/24 0429) BP: (125-143)/(59-82) 125/59 (01/24 0429) SpO2:  [91 %-93 %] 93 % (01/24 0429)  Intake/Output from previous day:  Intake/Output Summary (Last 24 hours) at 05/08/2020 0745 Last data filed at 05/07/2020 1416 Gross per 24 hour  Intake 897 ml  Output -  Net 897 ml    Intake/Output this shift: No intake/output data recorded.  Labs: Recent Labs    05/06/20 0615 05/07/20 0604  HGB 12.7 11.5*   Recent Labs    05/06/20 0615 05/07/20 0604  WBC 17.5* 12.5*  RBC 4.25 3.88  HCT 37.9 34.9*  PLT 164 144*   Recent Labs    05/07/20 0604 05/08/20 0345  NA 139 138  K 4.1 4.1  CL 106 103  CO2 26 26  BUN 15 13  CREATININE 0.54 0.61  GLUCOSE 155* 159*  CALCIUM 7.9* 7.7*   No results for input(s): LABPT, INR in the last 72 hours.   EXAM General - Patient is Alert and Oriented and pleasant. Extremity - Neurovascular intact Sensation intact distally Dorsiflexion/Plantar flexion intact Incision: scant drainage Compartment soft Dressing/Incision - Mild bloody drainage to the left hip incision. Motor Function - intact, moving foot and toes well on exam.   Past Medical History:  Diagnosis Date  . GERD (gastroesophageal reflux disease)   . Hypertension   . Thyroid disease     Assessment/Plan: 3 Days Post-Op Procedure(s) (LRB): ARTHROPLASTY BIPOLAR HIP  (HEMIARTHROPLASTY) (Left) Active Problems:   Hip fx (HCC)  Estimated body mass index is 24.89 kg/m as calculated from the following:   Height as of this encounter: 5\' 4"  (1.626 m).   Weight as of this encounter: 65.8 kg. Advance diet Up with therapy Discharge to SNF   Patient has had a BM. Upon discharge from hospital patient will continue Lovenox 40mg  daily for 14 days. Follow-up with [facility] in 10-14 days for staple removal.  DVT Prophylaxis - Lovenox, Foot Pumps and TED hose Weight-Bearing as tolerated to left leg  [facility], PA-C Orthopaedic Surgery 05/08/2020, 7:45 AM

## 2020-05-08 NOTE — Progress Notes (Signed)
Physical Therapy Treatment Patient Details Name: Megan Carpenter MRN: [id number] DOB: 14-Jun-1928 Today's Date: 05/08/2020    History of Present Illness Pt is an 87 y/o F who presented s/p mechanical fall in bathroom and L hip pain at [facility] (PLOF information obtained from pt's DTR in ED).  Pt found to have L displaced subcapital fracture of femoral neck. Now s/p L hemiarthroplasty with posterior hip precautions.  PMHx:  GERD, HTN, thyroid disease, Alz dementia, sundowning.    PT Comments    Pt had returned to bed with nursing but agrees to continued mobility.  Son in for session.  She is able to transition in and out of bed and stand x 2 for ex as below and is able to progress to sidesteps along bed to reposition which she was not able to do yesterday.     Follow Up Recommendations  SNF     Equipment Recommendations  None recommended by PT    Recommendations for Other Services       Precautions / Restrictions Precautions Precautions: Fall;Posterior Hip Precaution Booklet Issued: No (pt did not remember instructions in a short time of checking recall) Precaution Comments: posterior hip Restrictions Weight Bearing Restrictions: Yes LLE Weight Bearing: Weight bearing as tolerated Other Position/Activity Restrictions: posterior precautions    Mobility  Bed Mobility Overal bed mobility: Needs Assistance Bed Mobility: Supine to Sit;Sit to Supine     Supine to sit: Mod assist Sit to supine: Max assist      Transfers Overall transfer level: Needs assistance Equipment used: Rolling walker (2 wheeled) Transfers: Sit to/from Stand Sit to Stand: Min assist            Ambulation/Gait Ambulation/Gait assistance: Min guard Gait Distance (Feet): 3 Feet Assistive device: Rolling walker (2 wheeled) Gait Pattern/deviations: Step-to pattern     General Gait Details: sidesteps up to [facility] to reposition   Stairs             Wheelchair Mobility    Modified  Rankin (Stroke Patients Only)       Balance Overall balance assessment: Needs assistance Sitting-balance support: Feet supported Sitting balance-Leahy Scale: Fair     Standing balance support: Bilateral upper extremity supported;During functional activity Standing balance-Leahy Scale: Poor                              Cognition Arousal/Alertness: Awake/alert Behavior During Therapy: WFL for tasks assessed/performed Overall Cognitive Status: No family/caregiver present to determine baseline cognitive functioning                                        Exercises Other Exercises Other Exercises: standing LLE AROM    General Comments        Pertinent Vitals/Pain Pain Assessment: Faces Faces Pain Scale: Hurts little more Pain Location: L hip with attempts to mobilize Pain Descriptors / Indicators: Grimacing Pain Intervention(s): Monitored during session;Limited activity within patient's tolerance;Repositioned    Home Living                      Prior Function            PT Goals (current goals can now be found in the care plan section) Progress towards PT goals: Progressing toward goals    Frequency    BID  PT Plan Current plan remains appropriate    Co-evaluation              AM-PAC PT "6 Clicks" Mobility   Outcome Measure  Help needed turning from your back to your side while in a flat bed without using bedrails?: A Lot Help needed moving from lying on your back to sitting on the side of a flat bed without using bedrails?: A Lot Help needed moving to and from a bed to a chair (including a wheelchair)?: A Lot Help needed standing up from a chair using your arms (e.g., wheelchair or bedside chair)?: A Little Help needed to walk in hospital room?: A Lot Help needed climbing 3-5 steps with a railing? : Total 6 Click Score: 12    End of Session Equipment Utilized During Treatment: Gait belt Activity Tolerance:  Patient tolerated treatment well Patient left: in bed;with call bell/phone within reach;with bed alarm set;with family/visitor present Nurse Communication: Mobility status;Other (comment) Pain - Right/Left: Right Pain - part of body: Hip     Time: 6789-3810 PT Time Calculation (min) (ACUTE ONLY): 10 min  Charges:  $Therapeutic Activity: 8-22 mins                    Danielle Dess, PTA 05/08/20, 2:24 PM

## 2020-05-09 ENCOUNTER — Inpatient Hospital Stay: Payer: Medicare Other

## 2020-05-09 LAB — GLUCOSE, CAPILLARY
Glucose-Capillary: 214 mg/dL — ABNORMAL HIGH (ref 70–99)
Glucose-Capillary: 185 mg/dL — ABNORMAL HIGH (ref 70–99)
Glucose-Capillary: 185 mg/dL — ABNORMAL HIGH (ref 70–99)
Glucose-Capillary: 179 mg/dL — ABNORMAL HIGH (ref 70–99)
Glucose-Capillary: 154 mg/dL — ABNORMAL HIGH (ref 70–99)

## 2020-05-09 LAB — SURGICAL PATHOLOGY

## 2020-05-09 NOTE — Progress Notes (Signed)
Physical Therapy Treatment Patient Details Name: Megan Carpenter MRN: [id number] DOB: [birth year] Today's Date: 05/09/2020    History of Present Illness Pt is an 87 y/o F who presented s/p mechanical fall in bathroom and L hip pain at [facility] (PLOF information obtained from pt's DTR in ED).  Pt found to have L displaced subcapital fracture of femoral neck. Now s/p L hemiarthroplasty with posterior hip precautions.  PMHx:  GERD, HTN, thyroid disease, Alz dementia, sundowning.    PT Comments    Pt was supine in bed upon arriving for PM session. X ray was in negative for dislocation or concerns. Pt did endorse having severe headache and RN informed post session. She requested not to get OOB but was cooperative with performing there ex in bed. See exercises listed below. Overall pt tolerated PT well today. Will benefit from SNF at DC to address deficits with ambulation, balance, and overall safety with ADLs. Acute PT will continue to follow and progress as able per POC.    Follow Up Recommendations  SNF     Equipment Recommendations  None recommended by PT    Recommendations for Other Services       Precautions / Restrictions Precautions Precautions: Fall;Posterior Hip Precaution Booklet Issued: No Restrictions Weight Bearing Restrictions: Yes LLE Weight Bearing: Weight bearing as tolerated    Mobility  Bed Mobility    General bed mobility comments: pt requested not to get OOB but did perform ther ex in bed          Cognition Arousal/Alertness: Awake/alert Behavior During Therapy:  (cooperative) Overall Cognitive Status: History of cognitive impairments - at baseline (per son, she is in memory unit at ALF)        General Comments: Pt was awake but much less energetic for PM session than AM session. Did not want to participate at first but then was agreeable to LE ther ex      Exercises General Exercises - Lower Extremity Ankle Circles/Pumps: AROM;10 reps Quad  Sets: AROM;10 reps Gluteal Sets: AROM;10 reps Heel Slides: AAROM;10 reps Hip ABduction/ADduction: AAROM;10 reps Straight Leg Raises: AAROM;10 reps     Pertinent Vitals/Pain Pain Assessment: No/denies pain Faces Pain Scale: No hurt Pain Location: L hip with attempts to mobilize Pain Descriptors / Indicators: Grimacing Pain Intervention(s): Limited activity within patient's tolerance           PT Goals (current goals can now be found in the care plan section) Acute Rehab PT Goals Patient Stated Goal: none stated Progress towards PT goals: Progressing toward goals    Frequency    BID      PT Plan Current plan remains appropriate       AM-PAC PT "6 Clicks" Mobility   Outcome Measure  Help needed turning from your back to your side while in a flat bed without using bedrails?: A Lot Help needed moving from lying on your back to sitting on the side of a flat bed without using bedrails?: A Lot Help needed moving to and from a bed to a chair (including a wheelchair)?: A Lot Help needed standing up from a chair using your arms (e.g., wheelchair or bedside chair)?: A Lot Help needed to walk in hospital room?: A Lot Help needed climbing 3-5 steps with a railing? : A Lot 6 Click Score: 12    End of Session   Activity Tolerance: Patient tolerated treatment well Patient left: in bed;with bed alarm set;with call bell/phone within reach;with family/visitor present (son  present) Nurse Communication: Mobility status PT Visit Diagnosis: Unsteadiness on feet (R26.81);Muscle weakness (generalized) (M62.81);Pain;Difficulty in walking, not elsewhere classified (R26.2);History of falling (Z91.81) Pain - Right/Left: Right Pain - part of body: Hip     Time: 9147-8295 PT Time Calculation (min) (ACUTE ONLY): 20 min  Charges:  $Therapeutic Exercise: 8-22 mins                     Jetta Lout PTA 05/09/20, 4:36 PM

## 2020-05-09 NOTE — Progress Notes (Signed)
Physical Therapy Treatment Patient Details Name: Megan Carpenter MRN: [id number] DOB: 03-25-1929 Today's Date: 05/09/2020    History of Present Illness Pt is an 87 y/o F who presented s/p mechanical fall in bathroom and L hip pain at [facility] (PLOF information obtained from pt's DTR in ED).  Pt found to have L displaced subcapital fracture of femoral neck. Now s/p L hemiarthroplasty with posterior hip precautions.  PMHx:  GERD, HTN, thyroid disease, Alz dementia, sundowning.    PT Comments    Pt was attempting to get OOB independently upon arriving. Her bed linenes were soaked and pt states " I need to change my wet clothes." Pt is alert and follow commands consistently however is only oriented to self. Unable to recall hip precautions. Did fully participate and tolerate session well. Was able to get OOB and ambulate short distances. Did have LLE external rotation in all wt bearing. MD notified. No c/o pain. Pt would greatly benefit from SNF at DC to continue skilled PT while progressing pt to PLOF. Pt would benefit to improve safety with all ADL. Acute PT will continue to follow and progress as able per POC. Pt was in recliner with RN in room and chair alarm in place at conclusion of PT session.    Follow Up Recommendations  SNF     Equipment Recommendations  None recommended by PT    Recommendations for Other Services       Precautions / Restrictions Precautions Precautions: Fall;Posterior Hip Precaution Booklet Issued: No Restrictions Weight Bearing Restrictions: Yes LLE Weight Bearing: Weight bearing as tolerated    Mobility  Bed Mobility Overal bed mobility: Needs Assistance Bed Mobility: Supine to Sit Rolling: Mod assist   Supine to sit: Mod assist     General bed mobility comments: Pt required increased time to exit bed + mod assist. max vcs throughout for technique and sequencing.  Transfers Overall transfer level: Needs assistance Equipment used: Rolling  walker (2 wheeled) Transfers: Sit to/from Stand Sit to Stand: Min assist;From elevated surface         General transfer comment: Pt performed STS 3 x EOB prior to ambulation. She needs vcs for handplacement and improved technique for safety  Ambulation/Gait Ambulation/Gait assistance: Min assist Gait Distance (Feet): 8 Feet Assistive device: Rolling walker (2 wheeled) Gait Pattern/deviations: Step-to pattern Gait velocity: slow   General Gait Details: Pt has severe L toe out throughout ambulation however was able to ambulate 8 ft in room without LOB. min assist for safety. pt did endorse "stiffness" in LLE during ambulation.       Balance Overall balance assessment: Mild deficits observed, not formally tested Sitting-balance support: Feet supported Sitting balance-Leahy Scale: Fair     Standing balance support: Bilateral upper extremity supported;During functional activity Standing balance-Leahy Scale: Fair Standing balance comment: no LOB however min assist during dynamic task       Cognition Arousal/Alertness: Awake/alert Behavior During Therapy: Flat affect Overall Cognitive Status: No family/caregiver present to determine baseline cognitive functioning        General Comments: Pt is alert but pleasantly confused. Disoriented x 3. oriented to self only. did follow commands well throughout.      Exercises General Exercises - Lower Extremity Ankle Circles/Pumps: AROM;10 reps Quad Sets: AROM;5 reps Gluteal Sets: AROM;5 reps        Pertinent Vitals/Pain Pain Assessment: No/denies pain Faces Pain Scale: No hurt Pain Location: L hip with attempts to mobilize Pain Descriptors / Indicators: Grimacing Pain Intervention(s):  Limited activity within patient's tolerance;Monitored during session;Repositioned           PT Goals (current goals can now be found in the care plan section) Acute Rehab PT Goals Patient Stated Goal: none stated Progress towards PT goals:  Progressing toward goals    Frequency    BID      PT Plan Current plan remains appropriate       AM-PAC PT "6 Clicks" Mobility   Outcome Measure  Help needed turning from your back to your side while in a flat bed without using bedrails?: A Lot Help needed moving from lying on your back to sitting on the side of a flat bed without using bedrails?: A Lot Help needed moving to and from a bed to a chair (including a wheelchair)?: A Lot Help needed standing up from a chair using your arms (e.g., wheelchair or bedside chair)?: A Lot Help needed to walk in hospital room?: A Lot Help needed climbing 3-5 steps with a railing? : A Lot 6 Click Score: 12    End of Session Equipment Utilized During Treatment: Gait belt Activity Tolerance: Patient tolerated treatment well Patient left: in chair;with call bell/phone within reach;with chair alarm set;with nursing/sitter in room Nurse Communication: Mobility status;Other (comment) (messaged MD about severe LLE external rotation) PT Visit Diagnosis: Unsteadiness on feet (R26.81);Muscle weakness (generalized) (M62.81);Pain;Difficulty in walking, not elsewhere classified (R26.2);History of falling (Z91.81) Pain - Right/Left: Right Pain - part of body: Hip     Time: 0768-0881 PT Time Calculation (min) (ACUTE ONLY): 26 min  Charges:  $Gait Training: 8-22 mins $Therapeutic Activity: 8-22 mins                     Jetta Lout PTA 05/09/20, 9:18 AM

## 2020-05-09 NOTE — Progress Notes (Addendum)
PROGRESS NOTE    Megan Carpenter  YDX:[id number] DOB: 10-29-1928 DOA: 05/04/2020 PCP: Smitty Cords, DO   Brief Narrative:  This 87 years old female with PMH significant for GERD, hypertension, hypothyroidism, chronic dementia,  Alzheimer's type, progressive cognitive decline with history of sundowning on risperidone 0.25 mg was sent from nursing home status post fall. History is obtained from daughter who reports after she called the facility and she was told that her mom has slipped in the bathroom and had a left hip pain. She denies any LOC. X-ray hip shows displaced left femoral neck fracture. Orthopeadics consulted,  Patient underwent left hip hemiarthroplasty , tolerated well.  Postoperative day 4.  Assessment & Plan:   Active Problems:   Hip fx (HCC)  Displaced subcapital fracture of the left femoral neck: Patient presented status post fall in a nursing home. X-ray hip shows displaced subcapital fracture of left femoral neck. Continue hip fracture protocol. Orthopedic consulted, status post left hip hemiarthroplasty,  Postoperative day 4. She reports pain is controlled.  Adequate pain control PT and OT evaluation. Recommended weightbearing as tolerated on left leg, continue physical therapy. She probably needs rehab after discharge.  Pending insurance authorization.  Acute Sinusitis  Noted on CT scan.  Continue Z-Pak.  Hyperglycemia Hb A1c 6.4 monitor fingersticks.  GERD Stable.  HTN: BP improved. Hydralazine as needed  Hypothyroidism Continue synthroid  Chronic dementia, Alzheimer's type progressive decline with history of sundowning  Continue  Risperidone 0.25mg   Per last pcp note for behavioral disturbances and sleep Home dose of Risperdal 0.5 mg twice daily resumed   DVT prophylaxis:  Lovenox Code Status: Full code Family Communication:  No family at bed side. Disposition Plan:   Status is: Inpatient  Remains inpatient appropriate  because:Inpatient level of care appropriate due to severity of illness   Dispo: The patient is from: SNF              Anticipated d/c is to: SNF              Anticipated d/c date is:  1-2 days              Patient currently is medically stable for DC   Consultants:   Orthopedics  Procedures: Left hip hemiarthroplasty.    Antimicrobials:  Anti-infectives (From admission, onward)   Start     Dose/Rate Route Frequency Ordered Stop   05/05/20 1500  ceFAZolin (ANCEF) IVPB 2g/100 mL premix        2 g 200 mL/hr over 30 Minutes Intravenous Every 6 hours 05/05/20 1318 05/06/20 0606   05/05/20 0600  ceFAZolin (ANCEF) IVPB 2g/100 mL premix        2 g 200 mL/hr over 30 Minutes Intravenous 30 min pre-op 05/04/20 1440 05/05/20 0957   05/04/20 1930  azithromycin (ZITHROMAX) tablet 500 mg        500 mg Oral Daily 05/04/20 1930 05/08/20 0926   05/04/20 1402  ceFAZolin (ANCEF) IVPB 2g/100 mL premix  Status:  Discontinued        2 g 200 mL/hr over 30 Minutes Intravenous 30 min pre-op 05/04/20 1404 05/04/20 1440      Subjective: Patient was seen and examined at bedside. Overnight events noted.  Patient is s/p left hip hemiarthroplasty, tolerated well.  Postoperative day 4. Patient seems much improved, alert and oriented,  She appears at her baseline mental status.  Objective: Vitals:   05/09/20 0448 05/09/20 0801 05/09/20 1124 05/09/20 1517  BP: 120/71 126/80 Marland Kitchen)  151/80 (!) 146/68  Pulse: 97 99 (!) 101 (!) 102  Resp: 18 18 20 18   Temp: 98.3 F (36.8 C) 98.8 F (37.1 C) 98.2 F (36.8 C) 97.6 F (36.4 C)  TempSrc:      SpO2: 94% 93% 95% 94%  Weight:      Height:        Intake/Output Summary (Last 24 hours) at 05/09/2020 1523 Last data filed at 05/08/2020 1840 Gross per 24 hour  Intake 0 ml  Output --  Net 0 ml   Filed Weights   05/04/20 0855  Weight: 65.8 kg    Examination:  General exam: Appears calm and comfortable, not in any acute distress Respiratory system: Clear  to auscultation. Respiratory effort normal. Cardiovascular system: S1 & S2 heard, RRR. No JVD, murmurs, rubs, gallops or clicks. No pedal edema. Gastrointestinal system: Abdomen is nondistended, soft and nontender. No organomegaly or masses felt. Normal bowel sounds heard. Central nervous system: Alert and oriented x 2. No focal neurological deficits. Extremities: No edema, no cyanosis, no clubbing.  Left hip tenderness> improved Skin: No rashes, lesions or ulcers Psychiatry: Judgement and insight appear normal. Mood & affect appropriate.     Data Reviewed: I have personally reviewed following labs and imaging studies  CBC: Recent Labs  Lab 05/04/20 0900 05/05/20 0329 05/06/20 0615 05/07/20 0604  WBC 18.5* 17.4* 17.5* 12.5*  HGB 14.7 14.1 12.7 11.5*  HCT 45.5 41.9 37.9 34.9*  MCV 90.8 88.2 89.2 89.9  PLT 194 186 164 144*   Basic Metabolic Panel: Recent Labs  Lab 05/04/20 0900 05/05/20 0329 05/06/20 0615 05/07/20 0604 05/08/20 0345  NA 138 138 138 139 138  K 4.3 4.1 4.1 4.1 4.1  CL 102 102 108 106 103  CO2 24 25 23 26 26   GLUCOSE 235* 198* 162* 155* 159*  BUN 20 16 19 15 13   CREATININE 0.92 0.65 0.61 0.54 0.61  CALCIUM 9.1 8.8* 8.1* 7.9* 7.7*  MG  --   --  2.0  --   --   PHOS  --   --  2.1*  --   --    GFR: Estimated Creatinine Clearance: 44.5 mL/min (by C-G formula based on SCr of 0.61 mg/dL). Liver Function Tests: No results for input(s): AST, ALT, ALKPHOS, BILITOT, PROT, ALBUMIN in the last 168 hours. No results for input(s): LIPASE, AMYLASE in the last 168 hours. No results for input(s): AMMONIA in the last 168 hours. Coagulation Profile: No results for input(s): INR, PROTIME in the last 168 hours. Cardiac Enzymes: No results for input(s): CKTOTAL, CKMB, CKMBINDEX, TROPONINI in the last 168 hours. BNP (last 3 results) No results for input(s): PROBNP in the last 8760 hours. HbA1C: No results for input(s): HGBA1C in the last 72 hours. CBG: Recent Labs  Lab  05/08/20 1125 05/08/20 1645 05/08/20 2127 05/09/20 0801 05/09/20 1217  GLUCAP 252* 214* 184* 185* 185*   Lipid Profile: No results for input(s): CHOL, HDL, LDLCALC, TRIG, CHOLHDL, LDLDIRECT in the last 72 hours. Thyroid Function Tests: No results for input(s): TSH, T4TOTAL, FREET4, T3FREE, THYROIDAB in the last 72 hours. Anemia Panel: No results for input(s): VITAMINB12, FOLATE, FERRITIN, TIBC, IRON, RETICCTPCT in the last 72 hours. Sepsis Labs: No results for input(s): PROCALCITON, LATICACIDVEN in the last 168 hours.  Recent Results (from the past 240 hour(s))  SARS Coronavirus 2 by RT PCR (hospital order, performed in [facility] hospital lab) Nasopharyngeal Nasopharyngeal Swab     Status: None   Collection Time:  05/04/20  2:09 PM   Specimen: Nasopharyngeal Swab  Result Value Ref Range Status   SARS Coronavirus 2 NEGATIVE NEGATIVE Final    Comment: (NOTE) SARS-CoV-2 target nucleic acids are NOT DETECTED.  The SARS-CoV-2 RNA is generally detectable in upper and lower respiratory specimens during the acute phase of infection. The lowest concentration of SARS-CoV-2 viral copies this assay can detect is 250 copies / mL. A negative result does not preclude SARS-CoV-2 infection and should not be used as the sole basis for treatment or other patient management decisions.  A negative result may occur with improper specimen collection / handling, submission of specimen other than nasopharyngeal swab, presence of viral mutation(s) within the areas targeted by this assay, and inadequate number of viral copies (<250 copies / mL). A negative result must be combined with clinical observations, patient history, and epidemiological information.  Fact Sheet for Patients:   BoilerBrush.com.cy  Fact Sheet for Healthcare Providers: https://pope.com/  This test is not yet approved or  cleared by the [place] FDA and has been authorized for  detection and/or diagnosis of SARS-CoV-2 by FDA under an Emergency Use Authorization (EUA).  This EUA will remain in effect (meaning this test can be used) for the duration of the COVID-19 declaration under Section 564(b)(1) of the Act, 21 U.S.C. section 360bbb-3(b)(1), unless the authorization is terminated or revoked sooner.  Performed at [facility], [address] [place], [place] [address]   MRSA PCR Screening     Status: None   Collection Time: 05/05/20  1:53 AM   Specimen: Nasopharyngeal  Result Value Ref Range Status   MRSA by PCR NEGATIVE NEGATIVE Final    Comment:        The GeneXpert MRSA Assay (FDA approved for NASAL specimens only), is one component of a comprehensive MRSA colonization surveillance program. It is not intended to diagnose MRSA infection nor to guide or monitor treatment for MRSA infections. Performed at [facility], [address], [place], [place] [address]     Radiology Studies: DG Pelvis Portable  Result Date: 05/09/2020 CLINICAL DATA:  Postop left hip hemiarthroplasty 05/05/2020. EXAM: PORTABLE PELVIS 1-2 VIEWS COMPARISON:  Radiographs 05/05/2020. FINDINGS: 1312 hours. Stable postsurgical findings following left hip bipolar hemiarthroplasty. The hardware appears well positioned. No evidence of acute fracture or dislocation. A small amount of gas remains within the left hip periarticular soft tissues, and left hip skin staples are in place. IMPRESSION: No demonstrated complication following left hip hemiarthroplasty. Electronically Signed   By: Carey Bullocks M.D.   On: 05/09/2020 13:53   Scheduled Meds: . docusate sodium  100 mg Oral BID  . enoxaparin (LOVENOX) injection  40 mg Subcutaneous Q24H  . feeding supplement  237 mL Oral BID BM  . levothyroxine  100 mcg Oral Q0600  . multivitamin with minerals  1 tablet Oral Daily  . risperiDONE  0.5 mg Oral BID   Continuous Infusions:    LOS: 5 days    Time spent: 25  mins    Cipriano Bunker, MD [facility]   If 7PM-7AM, please contact night-coverage

## 2020-05-10 LAB — BASIC METABOLIC PANEL
Anion gap: 11 (ref 5–15)
BUN: 13 mg/dL (ref 8–23)
CO2: 24 mmol/L (ref 22–32)
Calcium: 8.3 mg/dL — ABNORMAL LOW (ref 8.9–10.3)
Chloride: 101 mmol/L (ref 98–111)
Creatinine, Ser: 0.55 mg/dL (ref 0.44–1.00)
GFR, Estimated: >60 mL/min (ref >60)
Glucose, Bld: 141 mg/dL — ABNORMAL HIGH (ref 70–99)
Potassium: 3.9 mmol/L (ref 3.5–5.1)
Sodium: 136 mmol/L (ref 135–145)

## 2020-05-10 LAB — GLUCOSE, CAPILLARY
Glucose-Capillary: 135 mg/dL — ABNORMAL HIGH (ref 70–99)
Glucose-Capillary: 149 mg/dL — ABNORMAL HIGH (ref 70–99)
Glucose-Capillary: 161 mg/dL — ABNORMAL HIGH (ref 70–99)
Glucose-Capillary: 158 mg/dL — ABNORMAL HIGH (ref 70–99)

## 2020-05-10 LAB — CBC
HCT: 38.3 % (ref 36.0–46.0)
Hemoglobin: 12.8 g/dL (ref 12.0–15.0)
MCH: 29.7 pg (ref 26.0–34.0)
MCHC: 33.4 g/dL (ref 30.0–36.0)
MCV: 88.9 fL (ref 80.0–100.0)
Platelets: 249 10*3/uL (ref 150–400)
RBC: 4.31 MIL/uL (ref 3.87–5.11)
RDW: 13.4 % (ref 11.5–15.5)
WBC: 12.1 10*3/uL — ABNORMAL HIGH (ref 4.0–10.5)
nRBC: 0 % (ref 0.0–0.2)

## 2020-05-10 LAB — MAGNESIUM: Magnesium: 2.2 mg/dL (ref 1.7–2.4)

## 2020-05-10 LAB — PHOSPHORUS: Phosphorus: 2.9 mg/dL (ref 2.5–4.6)

## 2020-05-10 NOTE — Progress Notes (Signed)
Occupational Therapy Treatment Patient Details Name: Megan Carpenter MRN: [id number] DOB: [birth year] Today's Date: 05/10/2020    History of present illness Pt is an 87 y/o F who presented s/p mechanical fall in bathroom and L hip pain at [facility] (PLOF information obtained from pt's DTR in ED).  Pt found to have L displaced subcapital fracture of femoral neck. Now s/p L hemiarthroplasty with posterior hip precautions.  PMHx:  GERD, HTN, thyroid disease, Alz dementia, sundowning.   OT comments  Pt seen for OT treatment this date to f/u re: safety with ADLs/ADL mobility. Pt is noted to be somewhat more drowsy during this session than last. RN notified. Pt is able to rouse and participate with OT, but requires constant cueing to keep eyes open/attend for safety. Pt up to chair when OT presents. OT engages pt in UB dressing in sitting. Pt requires extended time, cues to sequence and MIN A. OT engages pt in STS transfer to reposition with MOD A and SPS transfer from chair to bed with RW with cues for safe hand placement with MOD A. Pt does not control descent to EOB and requires assist for safety. Will continue to follow acutely. Continue to recommend SNF at d/c to recover strength to highest attainable level for safety with ADLs/ADL mobility.    Follow Up Recommendations  SNF;Supervision/Assistance - 24 hour    Equipment Recommendations  Other (comment) (defer)    Recommendations for Other Services      Precautions / Restrictions Precautions Precautions: Fall;Posterior Hip Precaution Booklet Issued: No Precaution Comments: posterior hip Required Braces or Orthoses:  (KI donned by PT to address knee buckling) Restrictions Weight Bearing Restrictions: Yes LLE Weight Bearing: Weight bearing as tolerated Other Position/Activity Restrictions: posterior precautions       Mobility Bed Mobility Overal bed mobility: Needs Assistance Bed Mobility: Sit to Supine     Supine to sit: Mod  assist Sit to supine: Max assist   General bed mobility comments: MAX A to manage LEs and trunk as well as cue pt to sequence task and attempt to contribute. In addition, MAX A to reposition in bed with use of draw sheet and trendelenburg.  Transfers Overall transfer level: Needs assistance Equipment used: Rolling walker (2 wheeled) Transfers: Sit to/from [employer] Sit to Stand: From elevated surface;Mod assist Stand pivot transfers: Mod assist;Max assist       General transfer comment: Pt able to step to recliner, but still experienced L leg buckling with KI. difficulty coordinating movements, step by step cueing    Balance Overall balance assessment: Mild deficits observed, not formally tested Sitting-balance support: Feet supported Sitting balance-Leahy Scale: Fair Sitting balance - Comments: able to sit unsupported with supervision   Standing balance support: Bilateral upper extremity supported;During functional activity Standing balance-Leahy Scale: Fair                             ADL either performed or assessed with clinical judgement   ADL                   Upper Body Dressing : Minimal assistance;Sitting Upper Body Dressing Details (indicate cue type and reason): to don clean gown, cues to sequence, increased time.                         Vision Patient Visual Report: No change from baseline     Perception  Praxis      Cognition Arousal/Alertness: Awake/alert Behavior During Therapy: WFL for tasks assessed/performed Overall Cognitive Status: History of cognitive impairments - at baseline                                 General Comments: Pt is awake and pleasant. able to consistently follow commands with increased time to process. Very motivated to get OOB        Exercises Other Exercises Other Exercises: OT engages pt in UB dressing in sitting. Pt requires extended time, cues to sequence and  MIN A. OT engages pt in STS transfer to reposition with MOD A and SPS transfer from chair to bed with RW with cues for safe hand placement with MOD A. Pt does not control descent to EOB and requires assist for safety. Other Exercises: 4 sit to stands from recliner with RW and modA. Cued for hand placement, last rep pt able to step forwards/backwards maxA   Shoulder Instructions       General Comments      Pertinent Vitals/ Pain       Pain Assessment: No/denies pain Faces Pain Scale: No hurt Pain Descriptors / Indicators: Grimacing Pain Intervention(s): Limited activity within patient's tolerance;Monitored during session;Repositioned  Home Living                                          Prior Functioning/Environment              Frequency  Min 1X/week        Progress Toward Goals  OT Goals(current goals can now be found in the care plan section)  Progress towards OT goals: Progressing toward goals  Acute Rehab OT Goals Patient Stated Goal: none stated OT Goal Formulation: Patient unable to participate in goal setting Time For Goal Achievement: 05/20/20 Potential to Achieve Goals: Fair  Plan Discharge plan remains appropriate    Co-evaluation                 AM-PAC OT "6 Clicks" Daily Activity     Outcome Measure   Help from another person eating meals?: None Help from another person taking care of personal grooming?: A Little Help from another person toileting, which includes using toliet, bedpan, or urinal?: A Lot   Help from another person to put on and taking off regular upper body clothing?: A Little Help from another person to put on and taking off regular lower body clothing?: A Lot 6 Click Score: 14    End of Session Equipment Utilized During Treatment: Gait belt;Rolling walker  OT Visit Diagnosis: Unsteadiness on feet (R26.81);Muscle weakness (generalized) (M62.81)   Activity Tolerance Patient tolerated treatment well    Patient Left in bed;with call bell/phone within reach;with bed alarm set   Nurse Communication Other (comment) (notified pt back to bed with bed alarm on middle setting)        Time: 1537-1600 OT Time Calculation (min): 23 min  Charges: OT General Charges $OT Visit: 1 Visit OT Treatments $Self Care/Home Management : 8-22 mins $Therapeutic Activity: 8-22 mins  Rejeana Brock, MS, OTR/L ascom [phone] 05/10/20, 5:49 PM

## 2020-05-10 NOTE — Progress Notes (Signed)
Physical Therapy Treatment Patient Details Name: Megan Carpenter MRN: [id number] DOB: 1928-11-20 Today's Date: 05/10/2020    History of Present Illness Pt is an 87 y/o F who presented s/p mechanical fall in bathroom and L hip pain at [facility] (PLOF information obtained from pt's DTR in ED).  Pt found to have L displaced subcapital fracture of femoral neck. Now s/p L hemiarthroplasty with posterior hip precautions.  PMHx:  GERD, HTN, thyroid disease, Alz dementia, sundowning.    PT Comments    Pt easily woken at start of session, cued to keep eyes open throughout session, pleasant and able to follow on step commands (intermittent tactile cues to facilitate needed). Supine exercises performed AAROM on LEs. Supine to sit modA to EOB, fair sitting balance noted once positioned in midline. Several sit <> Stands performed from EOB and from recliner with RW and max-modA. Cued for foot placement (including trying to decrease ER of L leg), anterior weight shift and hand placement on RW. Transferred to recliner ~10ft with maxA, L leg buckling noted still (significant) even with KI donned prior to mobility. Pt up in chair all needs in reach, asleep. The patient would benefit from further skilled PT intervention to continue to progress towards goals. Recommendation remains appropriate.        Follow Up Recommendations  SNF     Equipment Recommendations  None recommended by PT    Recommendations for Other Services       Precautions / Restrictions Precautions Precautions: Fall;Posterior Hip Precaution Booklet Issued: No Precaution Comments: posterior hip Required Braces or Orthoses:  (KI donned by PT to address knee buckling) Restrictions Weight Bearing Restrictions: Yes LLE Weight Bearing: Weight bearing as tolerated Other Position/Activity Restrictions: posterior precautions    Mobility  Bed Mobility Overal bed mobility: Needs Assistance Bed Mobility: Supine to Sit     Supine to  sit: Mod assist        Transfers Overall transfer level: Needs assistance Equipment used: Rolling walker (2 wheeled) Transfers: Sit to/from Stand Sit to Stand: From elevated surface;Mod assist Stand pivot transfers: Mod assist;Max assist       General transfer comment: Pt able to step to recliner, but still experienced L leg buckling with KI. difficulty coordinating movements, step by step cueing  Ambulation/Gait Ambulation/Gait assistance: Max assist;Mod assist Gait Distance (Feet): 3 Feet Assistive device: Rolling walker (2 wheeled) Gait Pattern/deviations: Step-to pattern     General Gait Details: pt has pronouced LLE knee buckling when taking steps today despite KI. unable to safely advance gait distances. Continued LLE external rotation/toe out   Stairs             Wheelchair Mobility    Modified Rankin (Stroke Patients Only)       Balance Overall balance assessment: Mild deficits observed, not formally tested Sitting-balance support: Feet supported Sitting balance-Leahy Scale: Fair Sitting balance - Comments: able to sit unsupported with supervision   Standing balance support: Bilateral upper extremity supported;During functional activity Standing balance-Leahy Scale: Fair                              Cognition Arousal/Alertness: Awake/alert Behavior During Therapy: WFL for tasks assessed/performed Overall Cognitive Status: History of cognitive impairments - at baseline                                 General Comments:  Pt is awake and pleasant. able to consistently follow commands with increased time to process. Very motivated to get OOB      Exercises Other Exercises Other Exercises: supine ankle pumps, heel slides, hip abduction AAROM (tactile cues needed throughout) prior to mobility Other Exercises: 4 sit to stands from recliner with RW and modA. Cued for hand placement, last rep pt able to step forwards/backwards  maxA    General Comments        Pertinent Vitals/Pain Pain Assessment: No/denies pain Faces Pain Scale: No hurt Pain Descriptors / Indicators: Grimacing Pain Intervention(s): Limited activity within patient's tolerance;Monitored during session;Repositioned    Home Living                      Prior Function            PT Goals (current goals can now be found in the care plan section) Progress towards PT goals: Progressing toward goals    Frequency    BID      PT Plan Current plan remains appropriate    Co-evaluation              AM-PAC PT "6 Clicks" Mobility   Outcome Measure  Help needed turning from your back to your side while in a flat bed without using bedrails?: A Lot Help needed moving from lying on your back to sitting on the side of a flat bed without using bedrails?: A Lot Help needed moving to and from a bed to a chair (including a wheelchair)?: A Lot Help needed standing up from a chair using your arms (e.g., wheelchair or bedside chair)?: A Lot Help needed to walk in hospital room?: A Lot Help needed climbing 3-5 steps with a railing? : A Lot 6 Click Score: 12    End of Session Equipment Utilized During Treatment: Gait belt Activity Tolerance: Patient tolerated treatment well Patient left: in chair;with call bell/phone within reach;with chair alarm set Nurse Communication: Mobility status PT Visit Diagnosis: Unsteadiness on feet (R26.81);Muscle weakness (generalized) (M62.81);Pain;Difficulty in walking, not elsewhere classified (R26.2);History of falling (Z91.81) Pain - Right/Left: Right Pain - part of body: Hip     Time: 1435-1459 PT Time Calculation (min) (ACUTE ONLY): 24 min  Charges:  $Therapeutic Exercise: 23-37 mins                     Olga Coaster PT, DPT 4:13 PM,05/10/20

## 2020-05-10 NOTE — TOC Progression Note (Signed)
Transition of Care [facility]) - Progression Note    Patient Details  Name: Megan Carpenter MRN: [id number] Date of Birth: 05/13/1928  Transition of [facility]) CM/SW Contact  Trenton Founds, RN Phone Number: 05/10/2020, 8:55 AM  Clinical Narrative:   RNCM reached out to patient's daughter Larene Beach regarding lack of available bed offers. Bed search was extended out to other facilities.     Expected Discharge Plan: Skilled Nursing Facility Barriers to Discharge: Continued Medical Work up  Expected Discharge Plan and Services Expected Discharge Plan: Skilled Nursing Facility       Living arrangements for the past 2 months: Assisted Living Facility ([facility])                                       Social Determinants of Health (SDOH) Interventions    Readmission Risk Interventions No flowsheet data found.

## 2020-05-10 NOTE — Progress Notes (Signed)
Subjective: 5 Days Post-Op Procedure(s) (LRB): ARTHROPLASTY BIPOLAR HIP (HEMIARTHROPLASTY) (Left) Patient reports pain as mild in the left hip. Patient is well, and has had no acute complaints or problems Plan is to go Rehab after hospital stay. Negative for chest pain and shortness of breath Fever: no Gastrointestinal: Negative for nausea and vomiting Patient denies passing any gas this morning but she has had a BM following surgery.  Objective: Vital signs in last 24 hours: Temp:  [97.5 F (36.4 C)-98.8 F (37.1 C)] 98 F (36.7 C) (01/26 0433) Pulse Rate:  [74-102] 87 (01/26 0433) Resp:  [15-20] 15 (01/25 2332) BP: (106-151)/(59-80) 126/67 (01/26 0433) SpO2:  [93 %-95 %] 94 % (01/26 0433)  Intake/Output from previous day: No intake or output data in the 24 hours ending 05/10/20 0735  Intake/Output this shift: No intake/output data recorded.  Labs: No results for input(s): HGB in the last 72 hours. No results for input(s): WBC, RBC, HCT, PLT in the last 72 hours. Recent Labs    05/08/20 0345  NA 138  K 4.1  CL 103  CO2 26  BUN 13  CREATININE 0.61  GLUCOSE 159*  CALCIUM 7.7*   No results for input(s): LABPT, INR in the last 72 hours.  EXAM General - Patient is Alert and pleasant. Extremity - Neurovascular intact Sensation intact distally Dorsiflexion/Plantar flexion intact Incision: scant drainage Compartment soft  Shortening of the left leg noted with patient in bed, denies any increase in pain with logrolling of the left leg. Dressing/Incision - Previous honeycomb has come loose, new honeycomb applied to the left hip this AM. Motor Function - intact, moving foot and toes well on exam.   Past Medical History:  Diagnosis Date  . GERD (gastroesophageal reflux disease)   . Hypertension   . Thyroid disease     Assessment/Plan: 5 Days Post-Op Procedure(s) (LRB): ARTHROPLASTY BIPOLAR HIP (HEMIARTHROPLASTY) (Left) Active Problems:   Hip fx  (HCC)  Estimated body mass index is 24.89 kg/m as calculated from the following:   Height as of this encounter: 5\' 4"  (1.626 m).   Weight as of this encounter: 65.8 kg. Advance diet Up with therapy Discharge to SNF   X-rays obtained yesterday of the left hip, some settling of the surgical hardware.  No evidence for dislocation.  No increase in pain for the patient, will continue to monitor. Patient has had a BM. Upon discharge from hospital patient will continue Lovenox 40mg  daily for 14 days. Follow-up with [facility] in 10-14 days for staple removal.  DVT Prophylaxis - Lovenox, Foot Pumps and TED hose Weight-Bearing as tolerated to left leg  [facility], PA-C Orthopaedic Surgery 05/10/2020, 7:35 AM

## 2020-05-10 NOTE — Progress Notes (Signed)
Case d/w Dr Joice Lofts. He has graciously agreed to take over as an attending role. Patient is medically stable for D/C when SNF bed available. We'll follow along distantly while she is here. Please don't hesitate to reach out if any questions.

## 2020-05-10 NOTE — Progress Notes (Signed)
Physical Therapy Treatment Patient Details Name: Megan Carpenter MRN: [id number] DOB: 06-20-1928 Today's Date: 05/10/2020    History of Present Illness Pt is an 87 y/o F who presented s/p mechanical fall in bathroom and L hip pain at [facility] (PLOF information obtained from pt's DTR in ED).  Pt found to have L displaced subcapital fracture of femoral neck. Now s/p L hemiarthroplasty with posterior hip precautions.  PMHx:  GERD, HTN, thyroid disease, Alz dementia, sundowning.    PT Comments    Pt was L side lying upon arriving. She is awake/alert and oriented x 3. Does have baseline cognition deficits however pt consistently follows commands. Pt is very pleasant and motivated to improve.Unable to recall hip precautions.Overall require increased assistance today with all safe functional mobility/transfers/gait. Pt has severe LLE knee buckle with attempts to progress RLE during ambulation. Overall did tolerate getting OOB to recliner without c/o pain. She tolerated performing there ex well. Will greatly benefit from SNF at DC to address deficits while improving independence with ADLs.    Follow Up Recommendations  SNF     Equipment Recommendations  None recommended by PT    Recommendations for Other Services       Precautions / Restrictions Precautions Precautions: Fall;Posterior Hip Precaution Booklet Issued: No Precaution Comments: posterior hip Restrictions Weight Bearing Restrictions: Yes LLE Weight Bearing: Weight bearing as tolerated    Mobility  Bed Mobility Overal bed mobility: Needs Assistance Bed Mobility: Sidelying to Sit Rolling:  (pt was L side lying upon arriving) Sidelying to sit: Mod assist;Max assist       General bed mobility comments: Pt was L side lying in bed upon arriving. Reviewed hip precautions however pt will need constant reminders. Pt required mod-max assist to achieve EOB sitting. Sat EOB x ~ 5 minutes performing LE ther ex prior to standing  and taking steps to recliner  Transfers Overall transfer level: Needs assistance Equipment used: Rolling walker (2 wheeled) Transfers: Sit to/from Stand Sit to Stand: Min assist;From elevated surface Stand pivot transfers: Mod assist;Max assist       General transfer comment: Pt stood EOB 2 x with min assist however when attempting to advance RLE to take steps, has severe LLE knee buckling. No knee buckling during session previous date.  Ambulation/Gait Ambulation/Gait assistance: Mod assist;Max assist Gait Distance (Feet): 3 Feet Assistive device: Rolling walker (2 wheeled) Gait Pattern/deviations: Step-to pattern Gait velocity: slow   General Gait Details: pt has pronouced LLE knee buckling when taking steps today. unable to safely advance gait distances. Continued LLE external rotation/toe out       Balance Overall balance assessment: Mild deficits observed, not formally tested Sitting-balance support: Feet supported Sitting balance-Leahy Scale: Fair     Standing balance support: Bilateral upper extremity supported;During functional activity Standing balance-Leahy Scale: Fair Standing balance comment: no LOB however min assist during dynamic task       Cognition Arousal/Alertness: Awake/alert Behavior During Therapy: WFL for tasks assessed/performed Overall Cognitive Status: History of cognitive impairments - at baseline      General Comments: Pt is awake and pleasant. able to consistently follow commands with increased time to process. Very motivated to get OOB      Exercises General Exercises - Lower Extremity Ankle Circles/Pumps: AROM;10 reps Quad Sets: AROM;10 reps Gluteal Sets: AROM;10 reps Heel Slides: AAROM;10 reps Hip ABduction/ADduction: AAROM;10 reps Straight Leg Raises: AAROM;10 reps        Pertinent Vitals/Pain Pain Assessment: No/denies pain Faces Pain Scale:  No hurt Pain Location: L hip with attempts to mobilize Pain Intervention(s):  Limited activity within patient's tolerance;Monitored during session;Repositioned           PT Goals (current goals can now be found in the care plan section) Acute Rehab PT Goals Patient Stated Goal: none stated Progress towards PT goals: Progressing toward goals    Frequency    BID      PT Plan Current plan remains appropriate       AM-PAC PT "6 Clicks" Mobility   Outcome Measure  Help needed turning from your back to your side while in a flat bed without using bedrails?: A Lot Help needed moving from lying on your back to sitting on the side of a flat bed without using bedrails?: A Lot Help needed moving to and from a bed to a chair (including a wheelchair)?: A Lot Help needed standing up from a chair using your arms (e.g., wheelchair or bedside chair)?: A Lot Help needed to walk in hospital room?: A Lot Help needed climbing 3-5 steps with a railing? : A Lot 6 Click Score: 12    End of Session Equipment Utilized During Treatment: Gait belt Activity Tolerance: Patient tolerated treatment well Patient left: in chair;with call bell/phone within reach;with chair alarm set Nurse Communication: Mobility status PT Visit Diagnosis: Unsteadiness on feet (R26.81);Muscle weakness (generalized) (M62.81);Pain;Difficulty in walking, not elsewhere classified (R26.2);History of falling (Z91.81) Pain - Right/Left: Right Pain - part of body: Hip     Time: 1657-9038 PT Time Calculation (min) (ACUTE ONLY): 23 min  Charges:  $Therapeutic Exercise: 8-22 mins $Therapeutic Activity: 8-22 mins                     Jetta Lout PTA 05/10/20, 9:02 AM

## 2020-05-11 NOTE — Progress Notes (Signed)
Physical Therapy Treatment Patient Details Name: Megan Carpenter MRN: [id number] DOB: November 12, 1928 Today's Date: 05/11/2020    History of Present Illness Pt is an 87 y/o F who presented s/p mechanical fall in bathroom and L hip pain at [facility] (PLOF information obtained from pt's DTR in ED).  Pt found to have L displaced subcapital fracture of femoral neck. Now s/p L hemiarthroplasty with posterior hip precautions.  PMHx:  GERD, HTN, thyroid disease, Alz dementia, sundowning.    PT Comments    Pt alert, in bed, denied pain at rest, did exhibit mild pain signs/symptoms with mobility but returned to resting in no distress at end of session. Supine to sit with modA and cues to facilitate motion, fair sitting balance noted. Pt was better able to initiate sit <> Stand transfers today, minA with RW from EOB (heavy pulling on RW), and modA from standard recliner height. Pt was able to increase her ambulation distance, improved LLE stability compared to previous session with KI on, but still experienced buckling needing mod-maxA for ambulation. LLE still externally rotated as well. (KI adjusted for fit by MD in room at end of session). Pt in recliner, all needs in reach, education on call bell reinforced, chair alarm on. The patient would benefit from further skilled PT intervention to continue to progress towards goals. Recommendation remains appropriate.      Follow Up Recommendations  SNF     Equipment Recommendations  None recommended by PT    Recommendations for Other Services       Precautions / Restrictions Precautions Precautions: Fall;Posterior Hip Precaution Booklet Issued: No Precaution Comments: posterior hip Required Braces or Orthoses: Other Brace;Knee Immobilizer - Left (KI donned to address knee buckling) Restrictions Weight Bearing Restrictions: Yes LLE Weight Bearing: Weight bearing as tolerated    Mobility  Bed Mobility Overal bed mobility: Needs Assistance Bed  Mobility: Supine to Sit     Supine to sit: Mod assist;HOB elevated     General bed mobility comments: ultimately still modA to sit EOB, assistance needed to facilitate motion and for weight shifting  Transfers Overall transfer level: Needs assistance Equipment used: Rolling walker (2 wheeled) Transfers: Sit to/from [employer] Sit to Stand: From elevated surface;Min assist Stand pivot transfers: Mod assist       General transfer comment: modA to stand pivot to recliner in room, but improved R foot clearance and navigation of RW  Ambulation/Gait Ambulation/Gait assistance: Mod assist;Max assist Gait Distance (Feet): 11 Feet Assistive device: Rolling walker (2 wheeled) Gait Pattern/deviations: Step-to pattern Gait velocity: decreased   General Gait Details: Pt with improved LLE stability compared to previous session with KI on, but still experienced buckling needing mod-maxA for ambulation. LLE still externally rotated as well.   Stairs             Wheelchair Mobility    Modified Rankin (Stroke Patients Only)       Balance Overall balance assessment: Needs assistance Sitting-balance support: Feet unsupported;Bilateral upper extremity supported (elevated bed, recliner tall for pt) Sitting balance-Leahy Scale: Fair     Standing balance support: Bilateral upper extremity supported;During functional activity Standing balance-Leahy Scale: Poor Standing balance comment: buckling noted of LLE                            Cognition Arousal/Alertness: Awake/alert Behavior During Therapy: WFL for tasks assessed/performed Overall Cognitive Status: History of cognitive impairments - at baseline  General Comments: Pt is awake and pleasant. able to consistently follow commands with increased time to process.      Exercises General Exercises - Lower Extremity Ankle Circles/Pumps: AROM;10  reps;Both Heel Slides: AROM;Strengthening;Right;10 reps;AAROM;Left Hip ABduction/ADduction: AROM;Strengthening;Right;AAROM;Left;10 reps    General Comments        Pertinent Vitals/Pain Pain Assessment: Faces Faces Pain Scale: Hurts a little bit Pain Location: L leg with walking Pain Descriptors / Indicators: Grimacing Pain Intervention(s): Limited activity within patient's tolerance;Monitored during session;Repositioned    Home Living                      Prior Function            PT Goals (current goals can now be found in the care plan section) Progress towards PT goals: Progressing toward goals    Frequency    BID      PT Plan Current plan remains appropriate    Co-evaluation              AM-PAC PT "6 Clicks" Mobility   Outcome Measure  Help needed turning from your back to your side while in a flat bed without using bedrails?: A Lot Help needed moving from lying on your back to sitting on the side of a flat bed without using bedrails?: A Lot Help needed moving to and from a bed to a chair (including a wheelchair)?: A Lot Help needed standing up from a chair using your arms (e.g., wheelchair or bedside chair)?: A Lot Help needed to walk in hospital room?: A Lot Help needed climbing 3-5 steps with a railing? : A Lot 6 Click Score: 12    End of Session Equipment Utilized During Treatment: Gait belt Activity Tolerance: Patient tolerated treatment well Patient left: in chair;with call bell/phone within reach;with chair alarm set Nurse Communication: Mobility status PT Visit Diagnosis: Unsteadiness on feet (R26.81);Muscle weakness (generalized) (M62.81);Pain;Difficulty in walking, not elsewhere classified (R26.2);History of falling (Z91.81) Pain - Right/Left: Left Pain - part of body: Hip     Time: 0932-1000 PT Time Calculation (min) (ACUTE ONLY): 28 min  Charges:  $Therapeutic Exercise: 23-37 mins                     Olga Coaster PT,  DPT 10:30 AM,05/11/20

## 2020-05-11 NOTE — TOC Progression Note (Signed)
Transition of Care [facility]) - Progression Note    Patient Details  Name: Megan Carpenter MRN: [id number] Date of Birth: November 14, 1928  [facility] of [facility] CM/SW Contact  Trenton Founds, RN Phone Number: 05/11/2020, 10:46 AM  Clinical Narrative:   RNCM spoke with daughter Larene Beach to present two bed offers of [address] in [place] and [address] in [place]. She will look at facilities and get back to this CM with choice.     Expected Discharge Plan: Skilled Nursing Facility Barriers to Discharge: Continued Medical Work up  Expected Discharge Plan and Services Expected Discharge Plan: Skilled Nursing Facility       Living arrangements for the past 2 months: Assisted Living Facility ([facility]) Expected Discharge Date: 05/11/20                                     Social Determinants of Health (SDOH) Interventions    Readmission Risk Interventions No flowsheet data found.

## 2020-05-11 NOTE — Progress Notes (Signed)
Physical Therapy Treatment Patient Details Name: Megan Carpenter MRN: [id number] DOB: September 29, 1928 Today's Date: 05/11/2020    History of Present Illness Pt is an 87 y/o F who presented s/p mechanical fall in bathroom and L hip pain at [facility] (PLOF information obtained from pt's DTR in ED).  Pt found to have L displaced subcapital fracture of femoral neck. Now s/p L hemiarthroplasty with posterior hip precautions.  PMHx:  GERD, HTN, thyroid disease, Alz dementia, sundowning.    PT Comments    Patient in bed, somewhat sidelying on L upon entering room. Agreeable to mobility. Session focused on transfer training and forwards/backwards stepping. x5 sit <> stands from EOB, minA and regressed to modA with fatigue. Pt did need maxA with RW for forward/backwards/laterally stepping due to L leg buckling/poor weight bearing. Pt repeated several times "there's a catch in my leg", no overt signs of pain. pt returned to supine and repositioned to promote quarter turned to R for patient comfort, pillows utilized to maintain posterior hip precautions. The patient would benefit from further skilled PT intervention to continue to progress towards goals. Recommendation remains appropriate.        Follow Up Recommendations  SNF     Equipment Recommendations  None recommended by PT    Recommendations for Other Services       Precautions / Restrictions Precautions Precautions: Fall;Posterior Hip Precaution Booklet Issued: No Precaution Comments: posterior hip Required Braces or Orthoses: Other Brace;Knee Immobilizer - Left Restrictions Weight Bearing Restrictions: Yes LLE Weight Bearing: Weight bearing as tolerated Other Position/Activity Restrictions: posterior precautions    Mobility  Bed Mobility Overal bed mobility: Needs Assistance Bed Mobility: Supine to Sit;Sit to Supine     Supine to sit: Mod assist Sit to supine: Mod assist      Transfers Overall transfer level: Needs  assistance Equipment used: Rolling walker (2 wheeled) Transfers: Sit to/from Stand Sit to Stand: Min assist;Mod assist         General transfer comment: minA from EOB x5, modA last attempt due to fatigue  Ambulation/Gait             General Gait Details: session focused on sit <> stands and forward/backward stepping at EOB. pt maxA for ambulation this PM due to difficulty with LLE weight bearing and placement   Stairs             Wheelchair Mobility    Modified Rankin (Stroke Patients Only)       Balance Overall balance assessment: Needs assistance Sitting-balance support: Feet unsupported;Bilateral upper extremity supported Sitting balance-Leahy Scale: Fair Sitting balance - Comments: able to sit unsupported with supervision   Standing balance support: Bilateral upper extremity supported;During functional activity Standing balance-Leahy Scale: Poor Standing balance comment: buckling noted of LLE                            Cognition Arousal/Alertness: Awake/alert Behavior During Therapy: WFL for tasks assessed/performed Overall Cognitive Status: History of cognitive impairments - at baseline                                 General Comments: Pt is awake and pleasant. able to consistently follow commands with increased time to process.      Exercises      General Comments        Pertinent Vitals/Pain Pain Assessment: Faces Faces Pain Scale:  Hurts a little bit Pain Location: L leg with walking Pain Descriptors / Indicators: Grimacing Pain Intervention(s): Limited activity within patient's tolerance;Monitored during session;Repositioned    Home Living                      Prior Function            PT Goals (current goals can now be found in the care plan section) Progress towards PT goals: Progressing toward goals    Frequency    BID      PT Plan Current plan remains appropriate    Co-evaluation               AM-PAC PT "6 Clicks" Mobility   Outcome Measure  Help needed turning from your back to your side while in a flat bed without using bedrails?: A Lot Help needed moving from lying on your back to sitting on the side of a flat bed without using bedrails?: A Lot Help needed moving to and from a bed to a chair (including a wheelchair)?: A Lot Help needed standing up from a chair using your arms (e.g., wheelchair or bedside chair)?: A Lot Help needed to walk in hospital room?: A Lot Help needed climbing 3-5 steps with a railing? : A Lot 6 Click Score: 12    End of Session Equipment Utilized During Treatment: Gait belt Activity Tolerance: Patient tolerated treatment well Patient left: with call bell/phone within reach;in bed;with bed alarm set Nurse Communication: Mobility status PT Visit Diagnosis: Unsteadiness on feet (R26.81);Muscle weakness (generalized) (M62.81);Pain;Difficulty in walking, not elsewhere classified (R26.2);History of falling (Z91.81) Pain - Right/Left: Left Pain - part of body: Hip     Time: 1440-1458 PT Time Calculation (min) (ACUTE ONLY): 18 min  Charges:  $Therapeutic Exercise: 8-22 mins                    Olga Coaster PT, DPT 4:24 PM,05/11/20

## 2020-05-11 NOTE — Progress Notes (Signed)
Patient is medically stable for D/C whenever SNF bed available.

## 2020-05-11 NOTE — Care Management Important Message (Signed)
Important Message  Patient Details  Name: Megan Carpenter MRN: [id number] Date of Birth: 09-Apr-1929   Medicare Important Message Given:  Yes     [name] 05/11/2020, 10:45 AM

## 2020-05-11 NOTE — Progress Notes (Signed)
Subjective: 6 Days Post-Op Procedure(s) (LRB): ARTHROPLASTY BIPOLAR HIP (HEMIARTHROPLASTY) (Left) Patient reports pain as mild in the left hip. Patient is well, and has had no acute complaints or problems Plan is to go Rehab after hospital stay. Negative for chest pain and shortness of breath Fever: no Gastrointestinal: Negative for nausea and vomiting Patient has had a BM.  Objective: Vital signs in last 24 hours: Temp:  [97.4 F (36.3 C)-98.3 F (36.8 C)] 97.7 F (36.5 C) (01/27 0817) Pulse Rate:  [70-92] 70 (01/27 0817) Resp:  [15-17] 15 (01/27 0817) BP: (107-140)/(52-90) 136/62 (01/27 0817) SpO2:  [94 %-96 %] 94 % (01/27 0817)  Intake/Output from previous day:  Intake/Output Summary (Last 24 hours) at 05/11/2020 0946 Last data filed at 05/10/2020 1413 Gross per 24 hour  Intake 1020 ml  Output -  Net 1020 ml    Intake/Output this shift: No intake/output data recorded.  Labs: Recent Labs    05/10/20 0805  HGB 12.8   Recent Labs    05/10/20 0805  WBC 12.1*  RBC 4.31  HCT 38.3  PLT 249   Recent Labs    05/10/20 0805  NA 136  K 3.9  CL 101  CO2 24  BUN 13  CREATININE 0.55  GLUCOSE 141*  CALCIUM 8.3*   No results for input(s): LABPT, INR in the last 72 hours.  EXAM General - Patient is Alert and pleasant. Extremity - Neurovascular intact Sensation intact distally Dorsiflexion/Plantar flexion intact Incision: scant drainage Compartment soft  Patient is working with PT this AM, holding left leg in external rotation when working with therapy. Dressing/Incision - Honeycomb dressing intact without drainage. Motor Function - intact, moving foot and toes well on exam.   Past Medical History:  Diagnosis Date  . GERD (gastroesophageal reflux disease)   . Hypertension   . Thyroid disease     Assessment/Plan: 6 Days Post-Op Procedure(s) (LRB): ARTHROPLASTY BIPOLAR HIP (HEMIARTHROPLASTY) (Left) Active Problems:   Hip fx (HCC)  Estimated body mass  index is 24.89 kg/m as calculated from the following:   Height as of this encounter: 5\' 4"  (1.626 m).   Weight as of this encounter: 65.8 kg. Advance diet Up with therapy Discharge to SNF   X-rays obtained of the left hip, some settling of the surgical hardware.  No evidence for dislocation.  No increase in pain for the patient, will continue to monitor. Knee immobilizer for left leg when working with PT. Patient has had a BM. Upon discharge from hospital patient will continue Lovenox 40mg  daily for 14 days. Follow-up with [facility] in 10-14 days for staple removal. Patient is stable for discharge to SNF when a bed is available.  DVT Prophylaxis - Lovenox, Foot Pumps and TED hose Weight-Bearing as tolerated to left leg  [facility], PA-C Orthopaedic Surgery 05/11/2020, 9:46 AM

## 2020-05-11 NOTE — Discharge Summary (Addendum)
Physician Discharge Summary  Patient ID: Megan Carpenter MRN: [id number] DOB/AGE: 08-10-1928 87 y.o.  Admit date: 05/04/2020 Discharge date: 05/12/2020  Admission Diagnoses:  Hip fx (HCC) [S72.009A] Fall [W19.XXXA] Closed fracture of left hip, initial encounter [facility]) [S72.002A]  Discharge Diagnoses: Patient Active Problem List   Diagnosis Date Noted  . Hip fx (HCC) 05/04/2020  . Palliative care patient 08/03/2018  . Severe protein-calorie malnutrition (HCC) 07/02/2016  . Impacted cerumen of both ears 03/13/2016  . Hearing reduced, bilateral 03/13/2016  . Late onset Alzheimer's disease with behavioral disturbance (HCC) 11/27/2015  . Elevated hemoglobin A1c 03/28/2015  . GERD (gastroesophageal reflux disease) 03/28/2015  . Hypertension 03/28/2015  . Hypothyroidism 03/28/2015  . Osteoporosis 03/28/2015    Past Medical History:  Diagnosis Date  . GERD (gastroesophageal reflux disease)   . Hypertension   . Thyroid disease      Transfusion: None.   Consultants (if any): Treatment Team:  Poggi, Excell Seltzer, MD  Delfino Lovett, MD  Discharged Condition: Improved  Hospital Course: Megan Carpenter is an 87 y.o. female who was admitted 05/04/2020 with a diagnosis of a displaced left femoral neck fracture and went to the operating room on 05/05/2020 and underwent the above named procedures.   Acute Sinusitis: Continue Z-pak  Hypothyroidism: Continue Synthroid.  Dementia, Alzheimer's Type: Continue home dose of Risperdal 0.5 mg twice daily   Surgeries: Procedure(s): ARTHROPLASTY BIPOLAR HIP (HEMIARTHROPLASTY) on 05/05/2020 Patient tolerated the surgery well. Taken to PACU where she was stabilized and then transferred to the orthopedic floor.  Started on Lovenox 40mg  q 24 hrs. Foot pumps applied bilaterally at 80 mm. Heels elevated on bed with rolled towels. No evidence of DVT. Negative Homan. Physical therapy started on day #1 for gait training and transfer. OT started day #1 for  ADL and assisted devices.  While working with PT, the left leg was noted to be in slight external rotation, repeat x-rays of the left leg demonstrated no evidence of dislocation and the patient is not reporting any increase pain in the left leg.  Patient's IV was removed on POD6.  Foley was removed on POD1.  Implants: Biomet press-fit system with a #9 laterally offset reduced proximal profile Echo femoral stem, a 44 mm outer diameter shell, and a -3 mm neck adapter.  She was given perioperative antibiotics:  Anti-infectives (From admission, onward)   Start     Dose/Rate Route Frequency Ordered Stop   05/05/20 1500  ceFAZolin (ANCEF) IVPB 2g/100 mL premix        2 g 200 mL/hr over 30 Minutes Intravenous Every 6 hours 05/05/20 1318 05/06/20 0606   05/05/20 0600  ceFAZolin (ANCEF) IVPB 2g/100 mL premix        2 g 200 mL/hr over 30 Minutes Intravenous 30 min pre-op 05/04/20 1440 05/05/20 0957   05/04/20 1930  azithromycin (ZITHROMAX) tablet 500 mg        500 mg Oral Daily 05/04/20 1930 05/08/20 0926   05/04/20 1402  ceFAZolin (ANCEF) IVPB 2g/100 mL premix  Status:  Discontinued        2 g 200 mL/hr over 30 Minutes Intravenous 30 min pre-op 05/04/20 1404 05/04/20 1440    .  She was given sequential compression devices, early ambulation, and Lovenox for DVT prophylaxis.  She benefited maximally from the hospital stay and there were no complications.    Recent vital signs:  Vitals:   05/11/20 2023 05/12/20 0446  BP: 103/62 (!) 119/54  Pulse: 71 75  Resp:  18  Temp: 98 F (36.7 C) 99.1 F (37.3 C)  SpO2: 95% 94%   Recent laboratory studies:  Lab Results  Component Value Date   HGB 12.8 05/10/2020   HGB 11.5 (L) 05/07/2020   HGB 12.7 05/06/2020   Lab Results  Component Value Date   WBC 12.1 (H) 05/10/2020   PLT 249 05/10/2020   No results found for: INR Lab Results  Component Value Date   NA 136 05/10/2020   K 3.9 05/10/2020   CL 101 05/10/2020   CO2 24 05/10/2020    BUN 13 05/10/2020   CREATININE 0.55 05/10/2020   GLUCOSE 141 (H) 05/10/2020   Discharge Medications:   Allergies as of 05/12/2020      Reactions   Penicillins Hives      Medication List    TAKE these medications   enoxaparin 40 MG/0.4ML injection Commonly known as: LOVENOX Inject 0.4 mLs (40 mg total) into the skin daily for 14 doses.   HYDROcodone-acetaminophen 5-325 MG tablet Commonly known as: NORCO/VICODIN Take 1-2 tablets by mouth every 6 (six) hours as needed for moderate pain.   hydrOXYzine 25 MG capsule Commonly known as: VISTARIL Take 25 mg by mouth 3 (three) times daily.   levothyroxine 100 MCG tablet Commonly known as: SYNTHROID TAKE 1 TABLET BY MOUTH BEFORE BREAKFAST DAILY What changed: See the new instructions.   melatonin 5 MG Tabs TAKE 1 TABLET BY MOUTH DAILY AT BEDTIME   risperiDONE 0.25 MG tablet Commonly known as: RISPERDAL TAKE 1 TABLET BY MOUTH WITH LUNCH AND ONE WITH DINNER DAILY   risperiDONE 0.5 MG tablet Commonly known as: RISPERDAL Take 0.5 mg by mouth 2 (two) times daily.   risperiDONE 1 MG tablet Commonly known as: RISPERDAL Take 1 mg by mouth 2 (two) times daily.   traMADol 50 MG tablet Commonly known as: ULTRAM Take 1 tablet (50 mg total) by mouth every 6 (six) hours as needed for moderate pain.      Diagnostic Studies: DG Chest 1 View  Result Date: 05/04/2020 CLINICAL DATA:  Preop.  History of hypertension. EXAM: CHEST  1 VIEW COMPARISON:  12/24/2007 FINDINGS: Cardiac silhouette is normal in size. No mediastinal or hilar masses. Lungs demonstrate prominent bronchovascular markings bilaterally. No evidence of pneumonia or pulmonary edema. No pleural effusion or pneumothorax. Skeletal structures are demineralized but grossly intact. IMPRESSION: No acute cardiopulmonary disease. Electronically Signed   By: Amie Portland M.D.   On: 05/04/2020 14:05   CT Head Wo Contrast  Result Date: 05/04/2020 CLINICAL DATA:  Pain following fall EXAM:  CT HEAD WITHOUT CONTRAST TECHNIQUE: Contiguous axial images were obtained from the base of the skull through the vertex without intravenous contrast. COMPARISON:  November 28, 2018 FINDINGS: Brain: Ventricles and sulci are normal in size and configuration for age. There is no intracranial mass, hemorrhage, extra-axial fluid collection, or midline shift. There is stable patchy small vessel disease in the centra semiovale bilaterally. No evident acute infarct. Vascular: No hyperdense vessel. There is calcification in each carotid siphon region. Skull: Bony calvarium appears intact. Sinuses/Orbits: Mucosal thickening noted in several ethmoid air cells. Other visualized paranasal sinuses are clear. Orbits appear symmetric bilaterally. Other: There is airspace opacity in multiple mastoid air cells bilaterally. IMPRESSION: Patchy periventricular small vessel disease, stable. No acute infarct. No mass or hemorrhage. There are foci of arterial vascular calcification. There is mucosal thickening in several ethmoid air cells. There are scattered areas of airspace opacity in multiple mastoid air cells  bilaterally. Electronically Signed   By: Bretta Bang III M.D.   On: 05/04/2020 13:39   DG Pelvis Portable  Result Date: 05/09/2020 CLINICAL DATA:  Postop left hip hemiarthroplasty 05/05/2020. EXAM: PORTABLE PELVIS 1-2 VIEWS COMPARISON:  Radiographs 05/05/2020. FINDINGS: 1312 hours. Stable postsurgical findings following left hip bipolar hemiarthroplasty. The hardware appears well positioned. No evidence of acute fracture or dislocation. A small amount of gas remains within the left hip periarticular soft tissues, and left hip skin staples are in place. IMPRESSION: No demonstrated complication following left hip hemiarthroplasty. Electronically Signed   By: Carey Bullocks M.D.   On: 05/09/2020 13:53   DG HIP UNILAT W OR W/O PELVIS 2-3 VIEWS LEFT  Result Date: 05/05/2020 CLINICAL DATA:  Status post total hip  replacement on the left EXAM: DG HIP (WITH OR WITHOUT PELVIS) 2-3V LEFT COMPARISON:  May 04, 2020. FINDINGS: Frontal pelvis and lateral left hip images were obtained. Patient is status post total hip replacement on the left with prosthetic components well-seated. No acute fracture or dislocation. Moderate narrowing right hip joint. IMPRESSION: Total hip replacement on the left with prosthetic components well-seated. No acute fracture or dislocation. Narrowing right hip joint. Electronically Signed   By: Bretta Bang III M.D.   On: 05/05/2020 12:12   DG HIP UNILAT WITH PELVIS 2-3 VIEWS LEFT  Result Date: 05/04/2020 CLINICAL DATA:  Fall.  Left hip pain. EXAM: DG HIP (WITH OR WITHOUT PELVIS) 2-3V LEFT COMPARISON:  None. FINDINGS: Displaced subcapital fracture of the left femoral neck. Distal fracture component has displaced superiorly by 2.5 cm. There is varus angulation between femoral head distal fracture component. No other fractures.  No bone lesions. Hip joints, SI joints and symphysis pubis are normally aligned. Soft tissues are unremarkable. IMPRESSION: 1. Displaced subcapital left femoral neck fracture with varus angulation. No significant comminution. No dislocation Electronically Signed   By: Amie Portland M.D.   On: 05/04/2020 14:06   Disposition: Discharge to SNF today pending bed availability.   Follow-up Information    Poggi, Excell Seltzer, MD. Go on 05/26/2020.   Specialty: Orthopedic Surgery Why: (Appt w/ Janell Quiet, PA-C)  For staple removal;  @ 10:45 am Contact information: [address] [facility] [place] [address] [phone]              Signed: Meriel Pica PA-C 05/12/2020, 7:41 AM

## 2020-05-12 LAB — SARS CORONAVIRUS 2 BY RT PCR (HOSPITAL ORDER, PERFORMED IN ~~LOC~~ HOSPITAL LAB): SARS Coronavirus 2: NEGATIVE

## 2020-05-12 NOTE — Progress Notes (Signed)
   05/12/20 1340  Clinical Encounter Type  Visited With Patient and family together  Visit Type Initial  Referral From Nurse  Consult/Referral To Chaplain  Spiritual Encounters  Spiritual Needs Prayer  While rounding Chaplain Ashleyanne Hemmingway visited [place]. Pt was sitting up in her bed and her son was sitting over by the window. They both welcomed the visit. I used reflective listening, left words of encouragement. Prayer was offered and accepted.

## 2020-05-12 NOTE — Progress Notes (Signed)
Subjective: 7 Days Post-Op Procedure(s) (LRB): ARTHROPLASTY BIPOLAR HIP (HEMIARTHROPLASTY) (Left) Patient is resting comfortably in bed, denies having any pain this morning. Patient is well, and has had no acute complaints or problems Plan is to go Rehab after hospital stay. Negative for chest pain and shortness of breath Fever: no Gastrointestinal: Negative for nausea and vomiting Patient has had a BM.  Objective: Vital signs in last 24 hours: Temp:  [97.7 F (36.5 C)-99.1 F (37.3 C)] 99.1 F (37.3 C) (01/28 0446) Pulse Rate:  [70-87] 75 (01/28 0446) Resp:  [15-18] 18 (01/28 0446) BP: (90-136)/(51-62) 119/54 (01/28 0446) SpO2:  [94 %-95 %] 94 % (01/28 0446)  Intake/Output from previous day:  Intake/Output Summary (Last 24 hours) at 05/12/2020 0740 Last data filed at 05/11/2020 1348 Gross per 24 hour  Intake 240 ml  Output -  Net 240 ml    Intake/Output this shift: No intake/output data recorded.  Labs: Recent Labs    05/10/20 0805  HGB 12.8   Recent Labs    05/10/20 0805  WBC 12.1*  RBC 4.31  HCT 38.3  PLT 249   Recent Labs    05/10/20 0805  NA 136  K 3.9  CL 101  CO2 24  BUN 13  CREATININE 0.55  GLUCOSE 141*  CALCIUM 8.3*   No results for input(s): LABPT, INR in the last 72 hours.  EXAM General - Patient is Alert and pleasant. Extremity - Neurovascular intact Sensation intact distally Dorsiflexion/Plantar flexion intact Incision: dressing C/D/I Compartment soft  Dressing/Incision - Honeycomb dressing intact without drainage. Motor Function - intact, moving foot and toes well on exam.  Abdomen is soft with normal bowel sounds.  Past Medical History:  Diagnosis Date  . GERD (gastroesophageal reflux disease)   . Hypertension   . Thyroid disease     Assessment/Plan: 7 Days Post-Op Procedure(s) (LRB): ARTHROPLASTY BIPOLAR HIP (HEMIARTHROPLASTY) (Left) Active Problems:   Hip fx (HCC)  Estimated body mass index is 24.89 kg/m as  calculated from the following:   Height as of this encounter: 5\' 4"  (1.626 m).   Weight as of this encounter: 65.8 kg. Advance diet Up with therapy Discharge to SNF   X-rays obtained of the left hip, some settling of the surgical hardware.  No evidence for dislocation.  No increase in pain for the patient, will continue to monitor. Knee immobilizer for left leg when working with PT. Patient has had a BM. Upon discharge from hospital patient will continue Lovenox 40mg  daily for 14 days. Follow-up with [facility] in 10-14 days for staple removal. Patient is stable for discharge to SNF when a bed is available.  Plan for possible discharge to SNF today.  DVT Prophylaxis - Lovenox, Foot Pumps and TED hose Weight-Bearing as tolerated to left leg  [facility], PA-C Orthopaedic Surgery 05/12/2020, 7:40 AM

## 2020-05-12 NOTE — Progress Notes (Signed)
Physical Therapy Treatment Patient Details Name: Megan Carpenter MRN: [id number] DOB: 27-Sep-1928 Today's Date: 05/12/2020    History of Present Illness Pt is an 87 y/o F who presented s/p mechanical fall in bathroom and L hip pain at [facility] (PLOF information obtained from pt's DTR in ED).  Pt found to have L displaced subcapital fracture of femoral neck. Now s/p L hemiarthroplasty with posterior hip precautions.  PMHx:  GERD, HTN, thyroid disease, Alz dementia, sundowning.    PT Comments    Patient alert, agreeable to PT, did not show any pain signs/symptoms with mobility. Pt demonstrated improvement in ambulation distance, tolerance, and sit <> stands this session. ModA for bed mobility with improved initiation,  Fair sitting balance noted. A total of 5 times sit <> Stand with RWmin-ModA performed. Pt incontinent of urine with mobility, extended time to clean patient, changes linens/gowns and clean the floors to ensure safety. She ambulated ~56ft with RW and modA, PT able to assist with L knee blocking to improved stability during ambulation. Pt in chair, with all needs in reach at end of session. The patient would benefit from further skilled PT intervention to continue to progress towards goals. Recommendation remains appropriate.    Follow Up Recommendations  SNF     Equipment Recommendations  None recommended by PT    Recommendations for Other Services       Precautions / Restrictions Precautions Precautions: Fall;Posterior Hip Precaution Booklet Issued: No Precaution Comments: posterior hip Required Braces or Orthoses: Other Brace;Knee Immobilizer - Left Restrictions Weight Bearing Restrictions: Yes LLE Weight Bearing: Weight bearing as tolerated    Mobility  Bed Mobility Overal bed mobility: Needs Assistance Bed Mobility: Supine to Sit     Supine to sit: Mod assist     General bed mobility comments: improved intiation this session, and improved immediate  sitting balance as well  Transfers Overall transfer level: Needs assistance Equipment used: Rolling walker (2 wheeled) Transfers: Sit to/from Stand Sit to Stand: Min assist;Mod assist         General transfer comment: minA initially, with fatigue did need modA for 5th rep  Ambulation/Gait Ambulation/Gait assistance: Mod assist;Max assist Gait Distance (Feet): 18 Feet Assistive device: Rolling walker (2 wheeled)   Gait velocity: decreased   General Gait Details: step through pattern, PT assisted with L knee block to promote stability during ambulation   Stairs             Wheelchair Mobility    Modified Rankin (Stroke Patients Only)       Balance Overall balance assessment: Needs assistance Sitting-balance support: Feet unsupported;Bilateral upper extremity supported Sitting balance-Leahy Scale: Fair Sitting balance - Comments: able to sit unsupported with supervision     Standing balance-Leahy Scale: Poor Standing balance comment: reliant on UE support                            Cognition Arousal/Alertness: Awake/alert Behavior During Therapy: WFL for tasks assessed/performed Overall Cognitive Status: History of cognitive impairments - at baseline                                 General Comments: Pt is awake and pleasant. able to consistently follow commands with increased time to process.      Exercises Other Exercises Other Exercises: Pt able to sit <> stand x5 during session, pt incontinent of urine.  Floor cleaned, socks, gown, linens changed, diaper donned prior to further ambulation attempts.    General Comments        Pertinent Vitals/Pain Pain Assessment: Faces Faces Pain Scale: No hurt    Home Living                      Prior Function            PT Goals (current goals can now be found in the care plan section) Progress towards PT goals: Progressing toward goals    Frequency    BID       PT Plan Current plan remains appropriate    Co-evaluation              AM-PAC PT "6 Clicks" Mobility   Outcome Measure  Help needed turning from your back to your side while in a flat bed without using bedrails?: A Lot Help needed moving from lying on your back to sitting on the side of a flat bed without using bedrails?: A Lot Help needed moving to and from a bed to a chair (including a wheelchair)?: A Lot Help needed standing up from a chair using your arms (e.g., wheelchair or bedside chair)?: A Lot Help needed to walk in hospital room?: A Lot Help needed climbing 3-5 steps with a railing? : A Lot 6 Click Score: 12    End of Session Equipment Utilized During Treatment: Gait belt Activity Tolerance: Patient tolerated treatment well Patient left: with call bell/phone within reach;in chair;with chair alarm set Nurse Communication: Mobility status PT Visit Diagnosis: Unsteadiness on feet (R26.81);Muscle weakness (generalized) (M62.81);Pain;Difficulty in walking, not elsewhere classified (R26.2);History of falling (Z91.81) Pain - Right/Left: Left Pain - part of body: Hip     Time: 1030-1103 PT Time Calculation (min) (ACUTE ONLY): 33 min  Charges:  $Gait Training: 8-22 mins $Therapeutic Exercise: 8-22 mins                     Olga Coaster PT, DPT 12:58 PM,05/12/20

## 2020-05-12 NOTE — Discharge Instructions (Signed)
Distal Femur Fracture Treated With Immobilization  A distal femur fracture is a break in the lower part of the thigh bone (femur) near the knee joint. If the fracture is stable and the bone is still in the normal position (nondisplaced), the injury may be treated with immobilization. This involves the use of a cast or splint to hold the leg in place. Immobilization ensures that the bone continues to stay in the correct position while the leg is healing. What are the causes? This condition may be caused by:  A fall. This injury can result from the impact of the fall or other violent contact.  A motor vehicle accident.  A sports injury.  Other collisions with a hard surface. What increases the risk? You are more likely to develop this condition if:  You are female.  You are 6-94 years old.  You participate in high-energy sports such as soccer, ice hockey, football, and baseball.  You have a condition that weakens the bones, such as osteoporosis.  You have had a knee replacement. What are the signs or symptoms? Symptoms of this condition include:  Pain.  Swelling.  Bruising.  Inability to bend your knee.  Misshapen knee.  Inability to walk.  Inability to use your injured leg to support your body weight. How is this diagnosed? This condition may be diagnosed based on:  Your symptoms.  A physical exam.  Other tests, such as: ? Imaging studies, such as an X-ray, CT scan, MRI scan, or ultrasound. ? A procedure to view the inside of your knee with a small camera (arthroscopy). How is this treated? This condition may be treated with:  A splint. You will wear the splint until the swelling goes down.  A cast. This is used to keep the fractured bone from moving while it heals. A cast is usually put on after swelling has gone down.  Physical therapy. Follow these instructions at home: Medicines  Take over-the-counter and prescription medicines only as told by your  health care provider.  Do not drive or operate heavy machinery while taking pain medicine.  If you are taking prescription pain medicine, take actions to prevent or treat constipation. Your health care provider may recommend that you: ? Drink enough fluid to keep your urine pale yellow. ? Eat foods that are high in fiber, such as fresh fruits and vegetables, whole grains, and beans. ? Limit foods that are high in fat and processed sugars, such as fried or sweet foods. ? Take an over-the-counter or prescription medicine for constipation. If you have a splint:  Wear the splint as told by your health care provider. Remove it only as told by your health care provider.  Loosen the splint if your toes tingle, become numb, or turn cold and blue.  Keep the splint clean.  If the splint is not waterproof: ? Do not let it get wet. ? Cover it with a watertight covering when you take a bath or a shower. If you have a cast:  Do not stick anything inside the cast to scratch your skin. Doing that increases your risk of infection.  Check the skin around the cast every day. Tell your health care provider about any concerns.  You may put lotion on dry skin around the edges of the cast. Do not put lotion on the skin underneath the cast.  Keep the cast clean.  If the cast is not waterproof: ? Do not let it get wet. ? Cover it with  a watertight covering when you take a bath or a shower. Activity  Do not use your leg to support your body weight until your health care provider says that you can. Follow weight-bearing restrictions.  Use crutches, a cane, or a walker as directed.  Return to your normal activities as directed by your health care provider. Ask your health care provider what activities are safe for you.  Do not drive until your health care provider approves. Managing pain, stiffness, and swelling  If directed, put ice on the injured area: ? If you have a removable splint, remove it  as told by your health care provider. ? Put ice in a plastic bag. ? Place a towel between your skin and the bag. ? Leave the ice on for 20 minutes, 2-3 times a day.  Move your toes often to avoid stiffness and to lessen swelling.  Raise the injured area above the level of your heart while you are lying down.   General instructions  Do not put pressure on any part of the cast or splint until it is fully hardened. This may take several hours.  Do not use any products that contain nicotine or tobacco, such as cigarettes and e-cigarettes. These can delay bone healing. If you need help quitting, ask your health care provider.  Keep all follow-up visits as told by your health care provider. This is important.   Contact a health care provider if:  You have knee pain and swelling.  You have trouble walking.  Your cast becomes wet or damaged or suddenly feels too tight. Get help right away if:  Your pain and swelling get worse.  You have severe pain below the fracture.  Your skin or toenails turn blue or gray, feel cold, or become numb.  You have fluid, blood, or pus coming from under your cast.  You develop a fever.  You have pain, swelling, or redness in your leg. Summary  A distal femur fracture is a break in the lower part of the thigh bone (femur).  Falls are the most common causes of femur fractures, but sports-related injuries and motor vehicle accidents also can cause this.  If the fracture is stable and the bone is still in the normal position (nondisplaced), the injury may be treated with immobilization. Immobilization ensures that the bone continues to stay in the correct position while the leg is healing. This information is not intended to replace advice given to you by your health care provider. Make sure you discuss any questions you have with your health care provider. Document Revised: 03/31/2019 Document Reviewed: 05/21/2017 Elsevier Patient Education  2021  [employer] POSTERIOR TOTAL HIP REPLACEMENT POSTOPERATIVE DIRECTIONS  Hip Rehabilitation, Guidelines Following Surgery  The results of a hip operation are greatly improved after range of motion and muscle strengthening exercises. Follow all safety measures which are given to protect your hip. If any of these exercises cause increased pain or swelling in your joint, decrease the amount until you are comfortable again. Then slowly increase the exercises. Call your caregiver if you have problems or questions.   HOME CARE INSTRUCTIONS  Remove items at home which could result in a fall. This includes throw rugs or furniture in walking pathways.   ICE to the affected hip every three hours for 30 minutes at a time and then as needed for pain and swelling.  Continue to use ice on the hip for pain and swelling from surgery. You may notice swelling that will  progress down to the foot and ankle.  This is normal after surgery.  Elevate the leg when you are not up walking on it.    Continue to use the breathing machine which will help keep your temperature down.  It is common for your temperature to cycle up and down following surgery, especially at night when you are not up moving around and exerting yourself.  The breathing machine keeps your lungs expanded and your temperature down.  DIET You may resume your previous home diet once your are discharged from the hospital.  DRESSING / WOUND CARE / SHOWERING You may change your dressing 3-5 days after surgery.  Then change the dressing every day with sterile gauze.  Please use good hand washing techniques before changing the dressing.  Do not use any lotions or creams on the incision until instructed by your surgeon. You need to keep your dressing dry after discharge.   Change the surgical dressing if needed with Physical Therapy and reapply a dry dressing each time.    ACTIVITY Walk with your walker as instructed. Use walker as long as suggested by  your caregivers. Avoid periods of inactivity such as sitting longer than an hour when not asleep. This helps prevent blood clots.  You may resume a sexual relationship in one month or when given the OK by your doctor.  You may return to work once you are cleared by your doctor.  Do not drive a car for 6 weeks or until released by you surgeon.  Do not drive while taking narcotics.  WEIGHT BEARING Weight bearing as tolerated with assist device (walker, cane, etc) as directed, use it as long as suggested by your surgeon or therapist, typically at least 4-6 weeks.  POSTOPERATIVE CONSTIPATION PROTOCOL Constipation - defined medically as fewer than three stools per week and severe constipation as less than one stool per week.  One of the most common issues patients have following surgery is constipation.  Even if you have a regular bowel pattern at home, your normal regimen is likely to be disrupted due to multiple reasons following surgery.  Combination of anesthesia, postoperative narcotics, change in appetite and fluid intake all can affect your bowels.  In order to avoid complications following surgery, here are some recommendations in order to help you during your recovery period.  Colace (docusate) - Pick up an over-the-counter form of Colace or another stool softener and take twice a day as long as you are requiring postoperative pain medications.  Take with a full glass of water daily.  If you experience loose stools or diarrhea, hold the colace until you stool forms back up.  If your symptoms do not get better within 1 week or if they get worse, check with your doctor.  Dulcolax (bisacodyl) - Pick up over-the-counter and take as directed by the product packaging as needed to assist with the movement of your bowels.  Take with a full glass of water.  Use this product as needed if not relieved by Colace only.   MiraLax (polyethylene glycol) - Pick up over-the-counter to have on hand.  MiraLax is  a solution that will increase the amount of water in your bowels to assist with bowel movements.  Take as directed and can mix with a glass of water, juice, soda, coffee, or tea.  Take if you go more than two days without a movement. Do not use MiraLax more than once per day. Call your doctor if you are still constipated  or irregular after using this medication for 7 days in a row.  If you continue to have problems with postoperative constipation, please contact the office for further assistance and recommendations.  If you experience "the worst abdominal pain ever" or develop nausea or vomiting, please contact the office immediatly for further recommendations for treatment.  ITCHING  If you experience itching with your medications, try taking only a single pain pill, or even half a pain pill at a time.  You can also use Benadryl over the counter for itching or also to help with sleep.   TED HOSE STOCKINGS Wear the elastic stockings on both legs for three weeks following surgery during the day but you may remove then at night for sleeping.  MEDICATIONS See your medication summary on the "After Visit Summary" that the nursing staff will review with you prior to discharge.  You may have some home medications which will be placed on hold until you complete the course of blood thinner medication.  It is important for you to complete the blood thinner medication as prescribed by your surgeon.  Continue your approved medications as instructed at time of discharge.  PRECAUTIONS If you experience chest pain or shortness of breath - call 911 immediately for transfer to the hospital emergency department.  If you develop a fever greater that 101 F, purulent drainage from wound, increased redness or drainage from wound, foul odor from the wound/dressing, or calf pain - CONTACT YOUR SURGEON.                                                   FOLLOW-UP APPOINTMENTS Make sure you keep all of your appointments  after your operation with your surgeon and caregivers. You should call the office at the above phone number and make an appointment for approximately two weeks after the date of your surgery or on the date instructed by your surgeon outlined in the "After Visit Summary".  RANGE OF MOTION AND STRENGTHENING EXERCISES  These exercises are designed to help you keep full movement of your hip joint. Follow your caregiver's or physical therapist's instructions. Perform all exercises about fifteen times, three times per day or as directed. Exercise both hips, even if you have had only one joint replacement. These exercises can be done on a training (exercise) mat, on the floor, on a table or on a bed. Use whatever works the best and is most comfortable for you. Use music or television while you are exercising so that the exercises are a pleasant break in your day. This will make your life better with the exercises acting as a break in routine you can look forward to.  Lying on your back, slowly slide your foot toward your buttocks, raising your knee up off the floor. Then slowly slide your foot back down until your leg is straight again.  Lying on your back spread your legs as far apart as you can without causing discomfort.  Lying on your side, raise your upper leg and foot straight up from the floor as far as is comfortable. Slowly lower the leg and repeat.  Lying on your back, tighten up the muscle in the front of your thigh (quadriceps muscles). You can do this by keeping your leg straight and trying to raise your heel off the floor. This helps strengthen the largest  muscle supporting your knee.  Lying on your back, tighten up the muscles of your buttocks both with the legs straight and with the knee bent at a comfortable angle while keeping your heel on the floor.      IF YOU ARE TRANSFERRED TO A SKILLED REHAB FACILITY If the patient is transferred to a skilled rehab facility following release from the  hospital, a list of the current medications will be sent to the facility for the patient to continue.  When discharged from the skilled rehab facility, please have the facility set up the patient's Home Health Physical Therapy prior to being released. Also, the skilled facility will be responsible for providing the patient with their medications at time of release from the facility to include their pain medication, the muscle relaxants, and their blood thinner medication. If the patient is still at the rehab facility at time of the two week follow up appointment, the skilled rehab facility will also need to assist the patient in arranging follow up appointment in our office and any transportation needs.  MAKE SURE YOU:  Understand these instructions.  Get help right away if you are not doing well or get worse.    Pick up stool softner and laxative for home use following surgery while on pain medications. Do not submerge incision under water. Please use good hand washing techniques while changing dressing each day. May shower starting three days after surgery. Please use a clean towel to pat the incision dry following showers. Continue to use ice for pain and swelling after surgery. Do not use any lotions or creams on the incision until instructed by your surgeon.

## 2020-05-12 NOTE — Progress Notes (Signed)
Nutrition Follow-up  DOCUMENTATION CODES:   Not applicable  INTERVENTION:  Increase Ensure Enlive po TID, each supplement provides 350 kcal and 20 grams of protein  Continue MVI with minerals po daily  Unable to increase supplement at this time, orders have been reconciled for discharge  NUTRITION DIAGNOSIS:   Increased nutrient needs related to post-op healing as evidenced by estimated needs.  GOAL:   Patient will meet greater than or equal to 90% of their needs    MONITOR:   Diet advancement,PO intake,Supplement acceptance,Labs,Weight trends,I & O's  REASON FOR ASSESSMENT:   Consult Assessment of nutrition requirement/status  ASSESSMENT:   87 year old female with PMHx of HTN, GERD, hypothyroidism, Alzheimer's dementia admitted with displaced subcapital fracture of left femoral neck, acute sinusitis.  Pt is s/p left hemiarthroplasty on 1/21 Pt sitting up in bedside chair working with PT this morning. Pt with poor appetite and intake, she ate 0% of breakfast this morning as well as 0% of 1 documented meal on 1/27 and 0% of 2 documented meals on 1/26. She is getting Ensure supplement twice daily, PT reports empty Ensure at bedside. Will increase supplement to 3 times daily.   No new weights this admission to trend. I/Os: +3772 ml x 24 hrs  Plan for possible discharge to SNF today  Medications reviewed and include: Colace, MVI  Labs: CBGs [id number]   Diet Order:   Diet Order            Diet heart healthy/carb modified Room service appropriate? Yes; Fluid consistency: Thin  Diet effective now                 EDUCATION NEEDS:   No education needs have been identified at this time  Skin:  Skin Assessment: Skin Integrity Issues: Skin Integrity Issues:: Incisions Incisions: closed incision left hip  Last BM:  1/26 - type 6 (medium;brown)  Height:   Ht Readings from Last 1 Encounters:  05/04/20 5\' 4"  (1.626 m)    Weight:   Wt Readings from Last 1  Encounters:  05/04/20 65.8 kg    BMI:  Body mass index is 24.89 kg/m.  Estimated Nutritional Needs:   Kcal:  1700-1900  Protein:  85-95 grams  Fluid:  1.7-1.9 L/day   05/06/20, RD, LDN Clinical Nutrition After Hours/Weekend Pager # in Amion

## 2020-05-12 NOTE — Plan of Care (Signed)
  Problem: Education: Goal: Knowledge of General Education information will improve Description: Including pain rating scale, medication(s)/side effects and non-pharmacologic comfort measures 05/12/2020 1420 by Alesia Banda, RN Outcome: Adequate for Discharge 05/12/2020 1419 by Alesia Banda, RN Outcome: Progressing   Problem: Health Behavior/Discharge Planning: Goal: Ability to manage health-related needs will improve 05/12/2020 1420 by Alesia Banda, RN Outcome: Adequate for Discharge 05/12/2020 1419 by Alesia Banda, RN Outcome: Progressing   Problem: Clinical Measurements: Goal: Ability to maintain clinical measurements within normal limits will improve 05/12/2020 1420 by Alesia Banda, RN Outcome: Adequate for Discharge 05/12/2020 1419 by Alesia Banda, RN Outcome: Progressing Goal: Will remain free from infection 05/12/2020 1420 by Alesia Banda, RN Outcome: Adequate for Discharge 05/12/2020 1419 by Alesia Banda, RN Outcome: Progressing Goal: Diagnostic test results will improve 05/12/2020 1420 by Alesia Banda, RN Outcome: Adequate for Discharge 05/12/2020 1419 by Alesia Banda, RN Outcome: Progressing Goal: Respiratory complications will improve 05/12/2020 1420 by Alesia Banda, RN Outcome: Adequate for Discharge 05/12/2020 1419 by Alesia Banda, RN Outcome: Progressing Goal: Cardiovascular complication will be avoided 05/12/2020 1420 by Alesia Banda, RN Outcome: Adequate for Discharge 05/12/2020 1419 by Alesia Banda, RN Outcome: Progressing   Problem: Activity: Goal: Risk for activity intolerance will decrease 05/12/2020 1420 by Alesia Banda, RN Outcome: Adequate for Discharge 05/12/2020 1419 by Alesia Banda, RN Outcome: Progressing   Problem: Nutrition: Goal: Adequate nutrition will be maintained 05/12/2020 1420 by Alesia Banda, RN Outcome: Adequate for Discharge 05/12/2020 1419 by Alesia Banda, RN Outcome: Progressing   Problem:  Coping: Goal: Level of anxiety will decrease 05/12/2020 1420 by Alesia Banda, RN Outcome: Adequate for Discharge 05/12/2020 1419 by Alesia Banda, RN Outcome: Progressing   Problem: Elimination: Goal: Will not experience complications related to bowel motility 05/12/2020 1420 by Alesia Banda, RN Outcome: Adequate for Discharge 05/12/2020 1419 by Alesia Banda, RN Outcome: Progressing Goal: Will not experience complications related to urinary retention 05/12/2020 1420 by Alesia Banda, RN Outcome: Adequate for Discharge 05/12/2020 1419 by Alesia Banda, RN Outcome: Progressing   Problem: Pain Managment: Goal: General experience of comfort will improve 05/12/2020 1420 by Alesia Banda, RN Outcome: Adequate for Discharge 05/12/2020 1419 by Alesia Banda, RN Outcome: Progressing   Problem: Safety: Goal: Ability to remain free from injury will improve 05/12/2020 1420 by Alesia Banda, RN Outcome: Adequate for Discharge 05/12/2020 1419 by Alesia Banda, RN Outcome: Progressing   Problem: Skin Integrity: Goal: Risk for impaired skin integrity will decrease 05/12/2020 1420 by Alesia Banda, RN Outcome: Adequate for Discharge 05/12/2020 1419 by Alesia Banda, RN Outcome: Progressing

## 2020-05-12 NOTE — Progress Notes (Signed)
Patient ready for discharge to [facility] health. AVS in packet. Family at bedside and is aware of disposition plan. Attempted to call report at facility was placed on hold for 12 minutes. Will try again at a later time.

## 2020-05-22 ENCOUNTER — Ambulatory Visit (INDEPENDENT_AMBULATORY_CARE_PROVIDER_SITE_OTHER): Payer: Medicare Other | Admitting: General Practice

## 2020-05-22 DIAGNOSIS — F0281 Dementia in other diseases classified elsewhere with behavioral disturbance: Secondary | ICD-10-CM

## 2020-05-22 DIAGNOSIS — F419 Anxiety disorder, unspecified: Secondary | ICD-10-CM

## 2020-05-22 DIAGNOSIS — S72002A Fracture of unspecified part of neck of left femur, initial encounter for closed fracture: Secondary | ICD-10-CM

## 2020-05-22 DIAGNOSIS — R296 Repeated falls: Secondary | ICD-10-CM

## 2020-05-22 DIAGNOSIS — F02818 Dementia in other diseases classified elsewhere, unspecified severity, with other behavioral disturbance: Secondary | ICD-10-CM

## 2020-05-22 DIAGNOSIS — I10 Essential (primary) hypertension: Secondary | ICD-10-CM

## 2020-05-22 NOTE — Patient Instructions (Signed)
Visit Information  PATIENT GOALS: Goals Addressed            This Visit's Progress   . RNCM: Prevent Falls-Dementia       Timeframe:  Long-Range Goal Priority:  High Start Date:                             Expected End Date:        05-22-2021               Follow Up Date 07-03-2020   - always use handrails on the stairs - always wear shoes or slippers with non-slip sole - install bathroom grab bars - keep a flashlight by the bed - make an emergency alert plan in case I fall - pick up clutter from the floors - remove throw rugs or use nonslip pads - use a cane or walker - use nightlight in the bathroom, halls - always wear low-heeled or flat shoes or slippers with nonskid soles    Why is this important?    There may be trouble with balance and getting around. Falls can happen.    Notes: Patient with a fall on 05-04-2020 with fracture to left hip    . RNCM: Prevent Falls/Injuries       Follow Up Date 07-03-2020    - add more outdoor lighting - always use handrails on the stairs - always wear low-heeled or flat shoes or slippers with nonskid soles - call the doctor if I am feeling too drowsy - install bathroom grab bars - keep a flashlight by the bed - learn how to get back up if I fall - make an emergency alert plan in case I fall - pick up clutter from the floors - use a nonslip pad with throw rugs, or remove them completely - use a cane or walker - use a nightlight in the bathroom - wear my glasses and/or hearing aid - attend therapy    Why is this important?    Most falls happen when it is hard for you to walk safely. Your balance may be off because of an illness. You may have pain in your knees, hip or other joints.   You may be overly tired or taking medicines that make you sleepy. You may not be able to see or hear clearly.   Falls can lead to broken bones, bruises or other injuries.   There are things you can do to help prevent falling.     Notes: Patient  had a fall with injury on 05-04-2020 left hip fracture resulting in hospitalization        The patient verbalized understanding of instructions, educational materials, and care plan provided today and declined offer to receive copy of patient instructions, educational materials, and care plan.   Telephone follow up appointment with care management team member scheduled for:07-03-2020 at 11:45 am  Alto Denver RN, MSN, CCM Community Care Coordinator [facility]  [facility] [place] Mobile: [phone]

## 2020-05-22 NOTE — Chronic Care Management (AMB) (Signed)
Chronic Care Management   CCM RN Visit Note  05/22/2020 Name: Megan Carpenter MRN: [id number] DOB: 03/29/1929  Subjective: Megan Carpenter is a 87 y.o. year old female who is a primary care patient of Olin Hauser, DO. The care management team was consulted for assistance with disease management and care coordination needs.    Engaged with patient by telephone for follow up visit in response to provider referral for case management and/or care coordination services.   Consent to Services:  The patient was given information about Chronic Care Management services, agreed to services, and gave verbal consent prior to initiation of services.  Please see initial visit note for detailed documentation.   Patient agreed to services and verbal consent obtained.   Assessment: Review of patient past medical history, allergies, medications, health status, including review of consultants reports, laboratory and other test data, was performed as part of comprehensive evaluation and provision of chronic care management services.   SDOH (Social Determinants of Health) assessments and interventions performed:    CCM Care Plan  Allergies  Allergen Reactions   Penicillins Hives    Outpatient Encounter Medications as of 05/22/2020  Medication Sig Note   enoxaparin (LOVENOX) 40 MG/0.4ML injection Inject 0.4 mLs (40 mg total) into the skin daily for 14 doses.    HYDROcodone-acetaminophen (NORCO/VICODIN) 5-325 MG tablet Take 1-2 tablets by mouth every 6 (six) hours as needed for moderate pain.    hydrOXYzine (VISTARIL) 25 MG capsule Take 25 mg by mouth 3 (three) times daily.    levothyroxine (SYNTHROID) 100 MCG tablet TAKE 1 TABLET BY MOUTH BEFORE BREAKFAST DAILY (Patient taking differently: Take 100 mcg by mouth daily before breakfast. TAKE 1 TABLET BY MOUTH BEFORE BREAKFAST DAILY)    melatonin 5 MG TABS TAKE 1 TABLET BY MOUTH DAILY AT BEDTIME (Patient taking differently: Take 5 mg by mouth  at bedtime.)    risperiDONE (RISPERDAL) 0.25 MG tablet TAKE 1 TABLET BY MOUTH WITH LUNCH AND ONE WITH DINNER DAILY (Patient not taking: Reported on 05/04/2020)    risperiDONE (RISPERDAL) 0.5 MG tablet Take 0.5 mg by mouth 2 (two) times daily.    risperiDONE (RISPERDAL) 1 MG tablet Take 1 mg by mouth 2 (two) times daily. 05/04/2020: Increased dose, not started yet   traMADol (ULTRAM) 50 MG tablet Take 1 tablet (50 mg total) by mouth every 6 (six) hours as needed for moderate pain.    No facility-administered encounter medications on file as of 05/22/2020.    Patient Active Problem List   Diagnosis Date Noted   Hip fx ([place]) 05/04/2020   Palliative care patient 08/03/2018   Severe protein-calorie malnutrition ([place]) 07/02/2016   Impacted cerumen of both ears 03/13/2016   Hearing reduced, bilateral 03/13/2016   Late onset Alzheimer's disease with behavioral disturbance ([place]) 11/27/2015   Elevated hemoglobin A1c 03/28/2015   GERD (gastroesophageal reflux disease) 03/28/2015   Hypertension 03/28/2015   Hypothyroidism 03/28/2015   Osteoporosis 03/28/2015    Conditions to be addressed/monitored:HTN, Anxiety, Dementia and Fall with injury on 05-04-2020  Care Plan : RNCM: Dementia (Adult)  Updates made by Vanita Ingles since 05/22/2020 12:00 AM    Problem: RNCM: Behavioral Symptoms- Anxiety/Dementia/Alzheimers Dx   Priority: Medium    Goal: Behavior Symptoms Management   Note:   Current Barriers:   Knowledge Deficits related to resources available for patient with decline in health and memory issues impacting where patient can live   Care Coordination needs related to placement/safety  in  a patient with dementia/Alzheimer's/anxiety  Chronic Disease Management support and education needs related to effective management of dementia/Alzheimer's/Anxiety and patient being in a safe environment  Lacks caregiver support.   Cognitive Deficits  Unable to independently manage  dementia, alzheimer's and anxiety  Unable to self administer medications as prescribed  Does not adhere to prescribed medication regimen  Lacks social connections  Unable to perform ADLs independently  Unable to perform IADLs independently  Does not maintain contact with provider office  Does not contact provider office for questions/concerns  Nurse Case Manager Clinical Goal(s):   Over the next 120 days, patient will verbalize understanding of plan for effective management of dementia/Alzheimer's/anxiety e  Over the next 120 days, patient will work with RNCM, CCM and pcp  to address needs related to placement concerns and expressed needs of needing help with recommendations by the daughter  Over the next 120 days, patient will work with CM clinical social worker to assist patients daughter with placement options/ideas  Interventions:   1:1 collaboration with Parks Ranger, Devonne Doughty, DO regarding development and update of comprehensive plan of care as evidenced by provider attestation and co-signature  Inter-disciplinary care team collaboration (see longitudinal plan of care)  Evaluation of current treatment plan related to dementia/alzheimers/anxiety and placement needs  and patient's adherence to plan as established by provider.  Advised patient to utilize resources sent by email to the patients daughter Vickie  at [email or web address]. Currently the patient is at [facility] in [place] but a decision has to be made about placement soon. She is taking therapy post hip fracture on 05-04-2020.  Provided education to patient re: resources for LTC in [facility] and other resources to help with meeting patients needs due to decline in health and dementia   Collaborated with LCSW and pcp regarding new need for placement and recommendations for helping the daughter  Social Work referral for help with placement ideas.  The family want the daughter to bring the patient home to  live with her but the daughter knows she will not have help and support from the family  Discussed plans with patient for ongoing care management follow up and provided patient with direct contact information for care management team  Patient Goals/Self-Care Activities Over the next 120 days, patient will:  - Patient will attend all scheduled provider appointments Patient will call provider office for new concerns or questions Patient will work with BSW to address care coordination needs and will continue to work with the clinical team to address health care and disease management related needs.   -Family will work with [facility] and CCM team to help with finding adequate placement for the patient - action plan for worsening symptoms mutually developed - Data processing manager information provided - medication list reviewed - pain assessed - symptom review completed  Follow Up Plan: Telephone follow up appointment with care management team member scheduled for:07-03-2020 R 11:45 AM       Task: RNCM: Develop Strategies to Manage Behavior   Note:   Care Management Activities:    - action plan for worsening symptoms mutually developed - community resource information provided - medication list reviewed - pain assessed - symptom review completed       Care Plan : RNCM: Hypertension (Adult)  Updates made by Vanita Ingles since 05/22/2020 12:00 AM    Problem: RNCM: Hypertension (Hypertension)   Priority: Medium    Goal: RNCM: Hypertension Monitored   Priority: Medium  Note:   Objective:   Last  practice recorded BP readings:  BP Readings from Last 3 Encounters:  05/12/20 (!) 110/57  06/09/19 (!) 140/111  12/15/18 (!) 160/79     Most recent eGFR/CrCl: No results found for: EGFR  No components found for: CRCL Current Barriers:   Knowledge Deficits related to basic understanding of hypertension pathophysiology and self care management  Knowledge Deficits related to understanding  of medications prescribed for management of hypertension  Non-adherence to prescribed medication regimen  Cognitive Deficits  Limited Social Lawyer.   Unable to self administer medications as prescribed  Does not attend all scheduled provider appointments  Does not adhere to prescribed medication regimen  Does not adhere to prescribed psychotropic medication regimen  Lacks social connections  Unable to perform ADLs independently  Unable to perform IADLs independently  Does not maintain contact with provider office  Does not contact provider office for questions/concerns Case Manager Clinical Goal(s):   Over the next 120 days, patient will verbalize understanding of plan for hypertension management  Over the next 120 days, patient will demonstrate improved adherence to prescribed treatment plan for hypertension as evidenced by taking all medications as prescribed, monitoring and recording blood pressure as directed, adhering to low sodium/DASH diet  Over the next 120 days, patient will demonstrate improved health management independence as evidenced by checking blood pressure as directed and notifying PCP if SBP>160 or DBP > 90, taking all medications as prescribe, and adhering to a low sodium diet as discussed. Interventions:   Collaboration with Olin Hauser, DO regarding development and update of comprehensive plan of care as evidenced by provider attestation and co-signature  Inter-disciplinary care team collaboration (see longitudinal plan of care)  Evaluation of current treatment plan related to hypertension self management and patient's adherence to plan as established by provider.  Provided education to patient re: stroke prevention, s/s of heart attack and stroke, DASH diet, complications of uncontrolled blood pressure  Reviewed medications with patient and discussed importance of compliance  Discussed plans with patient for  ongoing care management follow up and provided patient with direct contact information for care management team  Advised patient, providing education and rationale, to monitor blood pressure daily and record, calling PCP for findings outside established parameters.  Patient Goals/Self-Care Activities  Over the next 120 days, patient will:  - UNABLE to independently manage HTN due to dementia  Self administers medications as prescribed Attends all scheduled provider appointments Calls provider office for new concerns, questions, or BP outside discussed parameters Checks BP and records as discussed Follows a low sodium diet/DASH diet - blood pressure trends reviewed - depression screen reviewed - home or ambulatory blood pressure monitoring encouraged Follow Up Plan: Telephone follow up appointment with care management team member scheduled for: 07-03-2020 at 11:45 am   Task: Identify and Monitor Blood Pressure Elevation   Note:   Care Management Activities:    - blood pressure trends reviewed - depression screen reviewed - home or ambulatory blood pressure monitoring encouraged       Care Plan : RNCM: Fall Risk (Adult)  Updates made by Vanita Ingles since 05/22/2020 12:00 AM    Problem: RNCM: Fall Risk   Priority: High    Long-Range Goal: RNCM: Absence of Fall and Fall-Related Injury   Priority: High  Note:   Current Barriers:   Knowledge Deficits related to fall precautions in patient with advanced dementia   Decreased adherence to prescribed treatment for fall prevention  Unable to self administer medications  as prescribed  Does not adhere to prescribed medication regimen  Lacks social connections  Unable to perform ADLs independently  Unable to perform IADLs independently  Does not maintain contact with provider office  Does not contact provider office for questions/concerns  Knowledge Deficits related to resources in the area for patient to go after rehab is  completed   Care Coordination needs related to safety/falls and placement  in a patient with dementia, recent fall with fracture to left hip, and not being able to return to [facility] at this time dur to change in condition  Chronic Disease Management support and education needs related to falls with injury in a patient with dementia and changes   Lacks caregiver support.   Film/video editor.   Cognitive Deficits Clinical Goal(s):   Over the next 120 days, patient will demonstrate improved adherence to prescribed treatment plan for decreasing falls as evidenced by patient reporting and review of EMR  Over the next 120 days, patient will verbalize using fall risk reduction strategies discussed  Over the next 120 days, patient will not experience additional falls  Over the next 120 days, patient will verbalize understanding of plan for safety in the patients living environment and decreasing incidence of falls   Over the next 120 days, patient will work with [facility], CCM team and pcp to address needs related to fall with injury- fracture of left hip and the patients need for new housing  Over the next 120 days, patient will work with CM clinical Education officer, museum to assist with placement needs  Interventions:   Collaboration with Parks Ranger, Devonne Doughty, DO regarding development and update of comprehensive plan of care as evidenced by provider attestation and co-signature  Inter-disciplinary care team collaboration (see longitudinal plan of care)  Provided written and verbal education re: Potential causes of falls and Fall prevention strategies  Reviewed medications and discussed potential side effects of medications such as dizziness and frequent urination  Assessed for s/s of orthostatic hypotension  Assessed for falls since last encounter.  Assessed patients knowledge of fall risk prevention secondary to previously provided education.  Assessed working status of life alert  bracelet and patient adherence  Provided patient information for fall alert systems  Evaluation of current treatment plan related to falls and safety  and patient's adherence to plan as established by provider.  Advised patient to call for any new falls or questions   Provided education to patient re: long term care facilities in the area of [facility] by email to the patients daughter. A decision has to be made on what are the best living arrangements as the patient is not stable enough to go to [place] to live after status post fall with hip fracture on May 04, 2020.  Currently at [facility] in [place], [place].  The daughter is seeking resources due to having to pay out of pocket. The family wants the daughter to bring the patient to her home but her daughter does not feel this is the best thing because she has her own health problems and knows she will not have adequate help in the home from her family.   Collaborated with pcp and LCSW  regarding assistance for housing recommendations/resources   Social Work referral for additional support for facilities in the area   Discussed plans with patient for ongoing care management follow up and provided patient with direct contact information for care management team Patient Goals/Self-Care Activities  Over the next 120  days, patient will:   -  Utilize walker (assistive device) appropriately with all ambulation, currently wheelchair bound- working with rehab - De-clutter walkways - Change positions slowly - Wear secure fitting shoes at all times with ambulation - Utilize home lighting for dim lit areas - Demonstrate self and pet awareness at all times - activities of daily living skills assessed - assistive or adaptive device use encouraged - barriers to physical activity or exercise addressed - barriers to physical activity or exercise identified - barriers to safety identified - cognition assessed - cognitive-stimulating  activities promoted - fall prevention plan reviewed and updated - fear of falling, loss of independence and pain acknowledged - incontinence managed - modification of home and work environment promoted - participation in rehabilitation therapy encouraged Follow Up Plan: Telephone follow up appointment with care management team member scheduled for: 07-03-2020 at 11:45 am   Task: RNCM: Identify and Manage Contributors to Fall Risk   Note:   Care Management Activities:    - activities of daily living skills assessed - assistive or adaptive device use encouraged - barriers to physical activity or exercise addressed - barriers to physical activity or exercise identified - barriers to safety identified - cognition assessed - cognitive-stimulating activities promoted - fall prevention plan reviewed and updated - fear of falling, loss of independence and pain acknowledged - incontinence managed - modification of home and work environment promoted - participation in rehabilitation therapy encouraged         Plan:Telephone follow up appointment with care management team member scheduled for:  07-03-2020 at 11:45 am  [place], MSN, [facility] Mobile: [phone]

## 2020-05-24 ENCOUNTER — Ambulatory Visit: Payer: Self-pay | Admitting: Licensed Clinical Social Worker

## 2020-05-24 DIAGNOSIS — I10 Essential (primary) hypertension: Secondary | ICD-10-CM | POA: Diagnosis not present

## 2020-05-24 DIAGNOSIS — F0281 Dementia in other diseases classified elsewhere with behavioral disturbance: Secondary | ICD-10-CM

## 2020-05-24 DIAGNOSIS — G301 Alzheimer's disease with late onset: Secondary | ICD-10-CM | POA: Diagnosis not present

## 2020-05-24 DIAGNOSIS — F02818 Dementia in other diseases classified elsewhere, unspecified severity, with other behavioral disturbance: Secondary | ICD-10-CM

## 2020-05-24 DIAGNOSIS — F419 Anxiety disorder, unspecified: Secondary | ICD-10-CM

## 2020-05-24 DIAGNOSIS — R296 Repeated falls: Secondary | ICD-10-CM

## 2020-05-24 DIAGNOSIS — S72002A Fracture of unspecified part of neck of left femur, initial encounter for closed fracture: Secondary | ICD-10-CM | POA: Diagnosis not present

## 2020-05-24 NOTE — Chronic Care Management (AMB) (Signed)
Chronic Care Management    Clinical Social Work Note  05/24/2020 Name: Megan Carpenter MRN: [id number] DOB: 07/05/1928  Megan Carpenter is a 87 y.o. year old female who is a primary care patient of Olin Hauser, DO. The CCM team was consulted to assist the patient with chronic disease management and/or care coordination needs related to: Level of Care Concerns.   Engaged with patient by telephone for follow up visit in response to provider referral for social work chronic care management and care coordination services.   Consent to Services:  The patient was given the following information about Chronic Care Management services today, agreed to services, and gave verbal consent: 1. CCM service includes personalized support from designated clinical staff supervised by the primary care provider, including individualized plan of care and coordination with other care providers 2. 24/7 contact phone numbers for assistance for urgent and routine care needs. 3. Service will only be billed when office clinical staff spend 20 minutes or more in a month to coordinate care. 4. Only one practitioner may furnish and bill the service in a calendar month. 5.The patient may stop CCM services at any time (effective at the end of the month) by phone call to the office staff. 6. The patient will be responsible for cost sharing (co-pay) of up to 20% of the service fee (after annual deductible is met). Patient agreed to services and consent obtained.  Patient agreed to services and consent obtained.   Assessment: Review of patient past medical history, allergies, medications, and health status, including review of relevant consultants reports was performed today as part of a comprehensive evaluation and provision of chronic care management and care coordination services.     SDOH (Social Determinants of Health) assessments and interventions performed:    Advanced Directives Status: See Care Plan for related  entries.  CCM Care Plan  Allergies  Allergen Reactions  . Penicillins Hives    Outpatient Encounter Medications as of 05/24/2020  Medication Sig Note  . enoxaparin (LOVENOX) 40 MG/0.4ML injection Inject 0.4 mLs (40 mg total) into the skin daily for 14 doses.   Marland Kitchen HYDROcodone-acetaminophen (NORCO/VICODIN) 5-325 MG tablet Take 1-2 tablets by mouth every 6 (six) hours as needed for moderate pain.   . hydrOXYzine (VISTARIL) 25 MG capsule Take 25 mg by mouth 3 (three) times daily.   Marland Kitchen levothyroxine (SYNTHROID) 100 MCG tablet TAKE 1 TABLET BY MOUTH BEFORE BREAKFAST DAILY (Patient taking differently: Take 100 mcg by mouth daily before breakfast. TAKE 1 TABLET BY MOUTH BEFORE BREAKFAST DAILY)   . melatonin 5 MG TABS TAKE 1 TABLET BY MOUTH DAILY AT BEDTIME (Patient taking differently: Take 5 mg by mouth at bedtime.)   . risperiDONE (RISPERDAL) 0.25 MG tablet TAKE 1 TABLET BY MOUTH WITH LUNCH AND ONE WITH DINNER DAILY (Patient not taking: Reported on 05/04/2020)   . risperiDONE (RISPERDAL) 0.5 MG tablet Take 0.5 mg by mouth 2 (two) times daily.   . risperiDONE (RISPERDAL) 1 MG tablet Take 1 mg by mouth 2 (two) times daily. 05/04/2020: Increased dose, not started yet  . traMADol (ULTRAM) 50 MG tablet Take 1 tablet (50 mg total) by mouth every 6 (six) hours as needed for moderate pain.    No facility-administered encounter medications on file as of 05/24/2020.    Patient Active Problem List   Diagnosis Date Noted  . Hip fx ([place]) 05/04/2020  . Palliative care patient 08/03/2018  . Severe protein-calorie malnutrition ([place]) 07/02/2016  . Impacted cerumen  of both ears 03/13/2016  . Hearing reduced, bilateral 03/13/2016  . Late onset Alzheimer's disease with behavioral disturbance ([place]) 11/27/2015  . Elevated hemoglobin A1c 03/28/2015  . GERD (gastroesophageal reflux disease) 03/28/2015  . Hypertension 03/28/2015  . Hypothyroidism 03/28/2015  . Osteoporosis 03/28/2015    Care Plan : General Social  Work (Adult)  Updates made by Greg Cutter, LCSW since 05/24/2020 12:00 AM    Problem: Quality of Life (General Plan of Care)     Long-Range Goal: Quality of Life Maintained   Start Date: 05/24/2020  Priority: High  Note:   Evidence-based guidance:   Assess patient's thoughts about quality of life, goals and expectations, and dissatisfaction or desire to improve.   Identify issues of primary importance such as mental health, illness, exercise tolerance, pain, sexual function and intimacy, cognitive change, social isolation, finances and relationships.   Assess and monitor for signs/symptoms of psychosocial concerns, especially depression or ideations regarding harm to others or self; provide or refer for mental health services as needed.   Identify sensory issues that impact quality of life such as hearing loss, vision deficit; strategize ways to maintain or improve hearing, vision.   Promote access to services in the community to support independence such as support groups, home visiting programs, financial assistance, handicapped parking tags, durable medical equipment and emergency responder.   Promote activities to decrease social isolation such as group support or social, leisure and recreational activities, employment, use of social media; consider safety concerns about being out of home for activities.   Provide patient an opportunity to share by storytelling or a "life review" to give positive meaning to life and to assist with coping and negative experiences.   Encourage patient to tap into hope to improve sense of self.   Counsel based on prognosis and as early as possible about end-of-life and palliative care; consider referral to palliative care provider.   Advocate for the development of palliative care plan that may include avoidance of unnecessary testing and intervention, symptom control, discontinuation of medications, hospice and organ donation.   Counsel as early as  possible those with life-limiting chronic disease about palliative care; consider referral to palliative care provider.   Advocate for the development of palliative care plan.   Notes:   Timeframe:  Long-Range Goal Priority:  Medium  Start Date:  05/24/20                    Expected End Date:  08/21/20                 Follow Up Date- 30 days from 05/24/20  Current Barriers:  . Financial constraints related to seeking LTC placement where Medicaid is accepted . Limited social support . Level of care concerns . ADL IADL limitations . Mental Health Concerns  . Limited education about Personal Care Service Resources within [facility] . Limited access to caregiver . Cognitive Deficits . Memory Deficits . Inability to perform ADL's independently . Inability to perform IADL's independently  Clinical Social Work Clinical Goal(s):  Marland Kitchen Over the next 90 days, client will work with SW to address concerns related to needing more support in the home or pursing LTC placement  Interventions: . Phone call to patient's daughter regarding status of placement in an alternative facility due to closing of current facility. LCSW completed joint virtual telephone visit with patient's caregiver and PCP on 02/09/20. Family is wanting FL2 completed in order to secure long term placement. Patient  completed PPD skin test at pharmacy this morning and will go back on Friday to have her results results. FL2 was completed by PCP and family will pick this document up form front desk. Patient was successfully placed at [facility] for [place] on 02/11/20. Family was paying out pocket for placement. However, patient had an ED admission and is currently at [facility] in [place], [place] and will be discharging after 21 days of rehab. Per Lynelle Smoke, SW at [facility], patient is unable to go back to [facility] because she is unable to bare weight and has to be walking with her walker. Patient is now wheelchair bound. Family  has a follow up appointment with patient's surgeon tomorrow to check her prognosis. The daughter is very concerned about patient returning back home. Her family wants the daughter to bring her to her house and care for her. She does not want to do this because she knows she will not have any help from the family and the burden will fall all on her. Daughter already has health issues, caregiver strain and weighs 85 pounds. CCM RNCM sent emails with resources that CCM LCSW provided in the past. Daughter reports that they have already sold one of patient's homes and will be looking into selling the other to get additional funds to provide out of pocket long term care. Family will plan to pay out of pocket for patient to stay at [facility] after her rehab days end or they will consider hiring personal care services. CCM LCSW advised family to contact [facility] today to get information on their two caregiver support programs. . Patient celebrated her 54th birthday with cupcakes and family. Patient has been updated on her transition to [facility] and caregiver reports that she is happy with this plan.  . Patient interviewed and appropriate assessments performed . Provided patient with information about Medicaid enrollment and requirements. Family has successfully applied for Medicaid and was approved for services last week. However, the facility that patient currently resides at will not accept Medicaid as a form of payment for placement.  . Discussed plans with patient for ongoing care management follow up and provided patient with direct contact information for care management team . Advised patient to contact CCM program or PCP office for any urgent concerns  . Assisted patient/caregiver with obtaining information about health plan benefits . Provided education and assistance to client regarding Advanced Directives. . A voluntary and extensive discussion about advanced care planning including  explanation and discussion of advanced was undertaken with the patient's family.  Explanation regarding healthcare proxy and living will was reviewed and packet with forms with explanation of how to fill them out was given.   . Provided education to patient/caregiver regarding level of care options. . Provided education to patient/caregiver about Hospice and/or Palliative Care services . Family confirmed that patient is already on the wait list for [facility] program through [facility] but pt still has a 1 year left on the wait list. LCSW sent secure text message to family with [facility] contact information so that she can check and inquire where pt is at on the wait list. However, patient will not need this service once she is placed long term.  . Family confirm that patient has been denied Medicaid in the past due to patient's life insurance policy but was approved for services in August of 2021. Family wish to discontinue Medicaid as they are fearful that they will go after  patient's assets. DSS informed family that they can not discontinue patient's Medicaid until she sells her house on 03/03/20 and has too many assets to qualify for their program. Family will pay privately for [facility].  Marland Kitchen LCSW sent text message to daughter with a complete list of available ALF's within [facility] in the past. Daugher will revert back to this list as needed. This list includes each facility, if it accepts state funding or not and important DSS key contact numbers.  . Provided emotional support to family as they struggle with caregiver burnout symptoms and struggles to find facility care for patient. Support resource education provided.  Patient Self Care Activities:  . Currently UNABLE TO independently take appropriate care of self in the home and is in need of more support or LTC placement  Please see past updates related to this goal by clicking on the "Past Updates" button in the selected goal        Task: Support and Maintain Acceptable Degree of Health, Comfort and Happiness   Note:   Care Management Activities:    - affirmation provided - community involvement promoted - expression of thoughts about present/future encouraged - independence in all possible areas promoted - life review by storytelling encouraged - patient strengths promoted - psychosocial concerns monitored - self-expression encouraged - sleep diary encouraged - sleep hygiene techniques encouraged - social relationships promoted - strategies to maintain hearing and/or vision promoted - strategies to maintain intimacy promoted - wellness behaviors promoted    Notes:       Follow Up Plan: SW will follow up with patient by phone over the next 30 days      Eula Fried, [employer], MSW, [email or web address] Phone: [phone]

## 2020-06-07 ENCOUNTER — Telehealth: Payer: Self-pay | Admitting: Licensed Clinical Social Worker

## 2020-06-07 ENCOUNTER — Telehealth: Payer: Self-pay

## 2020-06-07 NOTE — Telephone Encounter (Signed)
  Chronic Care Management    Clinical Social Work General Follow Up Note  06/07/2020 Name: Megan Carpenter MRN: [id number] DOB: Sep 16, 1928  Megan Carpenter is a 87 y.o. year old female who is a primary care patient of Smitty Cords, DO. The CCM team was consulted for assistance with Level of Care Concerns.   Review of patient status, including review of consultants reports, relevant laboratory and other test results, and collaboration with appropriate care team members and the patient's provider was performed as part of comprehensive patient evaluation and provision of chronic care management services.    LCSW completed CCM outreach attempt today but was unable to reach patient successfully. A HIPPA compliant voice message was left encouraging patient to return call once available. LCSW will ask Scheduling Care Guide to reschedule CCM SW appointment with patient as well.  Outpatient Encounter Medications as of 06/07/2020  Medication Sig Note  . enoxaparin (LOVENOX) 40 MG/0.4ML injection Inject 0.4 mLs (40 mg total) into the skin daily for 14 doses.   Marland Kitchen HYDROcodone-acetaminophen (NORCO/VICODIN) 5-325 MG tablet Take 1-2 tablets by mouth every 6 (six) hours as needed for moderate pain.   . hydrOXYzine (VISTARIL) 25 MG capsule Take 25 mg by mouth 3 (three) times daily.   Marland Kitchen levothyroxine (SYNTHROID) 100 MCG tablet TAKE 1 TABLET BY MOUTH BEFORE BREAKFAST DAILY (Patient taking differently: Take 100 mcg by mouth daily before breakfast. TAKE 1 TABLET BY MOUTH BEFORE BREAKFAST DAILY)   . melatonin 5 MG TABS TAKE 1 TABLET BY MOUTH DAILY AT BEDTIME (Patient taking differently: Take 5 mg by mouth at bedtime.)   . risperiDONE (RISPERDAL) 0.25 MG tablet TAKE 1 TABLET BY MOUTH WITH LUNCH AND ONE WITH DINNER DAILY (Patient not taking: Reported on 05/04/2020)   . risperiDONE (RISPERDAL) 0.5 MG tablet Take 0.5 mg by mouth 2 (two) times daily.   . risperiDONE (RISPERDAL) 1 MG tablet Take 1 mg by mouth 2 (two)  times daily. 05/04/2020: Increased dose, not started yet  . traMADol (ULTRAM) 50 MG tablet Take 1 tablet (50 mg total) by mouth every 6 (six) hours as needed for moderate pain.    No facility-administered encounter medications on file as of 06/07/2020.    Follow Up Plan: Scheduling Care Guide will reach out to patient to reschedule appointment.   [name], BSW, MSW, LCSW [facility]  [facility] [email or web address] Phone: [phone]

## 2020-07-03 ENCOUNTER — Telehealth: Payer: Self-pay

## 2020-07-03 NOTE — Chronic Care Management (AMB) (Signed)
  Care Management   Note  07/03/2020 Name: LETECIA ARPS MRN: [id number] DOB: 13-Dec-1928  ARNOLD DEPINTO is a 87 y.o. year old female who is a primary care patient of Smitty Cords, DO and is actively engaged with the care management team. I reached out to [name] by phone today to assist with re-scheduling a follow up visit with the RN Case Manager  Follow up plan: Unsuccessful telephone outreach attempt made. A HIPAA compliant phone message was left for the patient providing contact information and requesting a return call.  The care management team will reach out to the patient again over the next 5 days.  If patient returns call to provider office, please advise to call Embedded Care Management Care Guide Penne Lash  at [phone]  Penne Lash, RMA Care Guide, Embedded Care Coordination [facility]  [place], [place] [address] Direct Dial: [phone] [email or web address]: [email or web address]

## 2020-07-06 ENCOUNTER — Telehealth: Payer: Self-pay | Admitting: *Deleted

## 2020-07-06 NOTE — Chronic Care Management (AMB) (Signed)
  Care Management   Note  07/06/2020 Name: [name] MRN: [id number] DOB: Mar 11, 1929  Megan Carpenter is a 87 y.o. year old female who is a primary care patient of Smitty Cords, DO and is actively engaged with the care management team. I reached out to [name] by phone today to assist with re-scheduling a follow up visit with the Licensed Clinical Social Worker  Follow up plan: Unsuccessful telephone outreach attempt made. A HIPAA compliant phone message was left for the patient providing contact information and requesting a return call.  The care management team will reach out to the patient again over the next 7 days.  If patient returns call to provider office, please advise to call Embedded Care Management Care Guide Avie Arenas at [phone]  Shubham [facility] Guide, Embedded Care Coordination [facility]  Care Management

## 2020-07-06 NOTE — Telephone Encounter (Signed)
Please reschedule LCSW [facility]

## 2020-07-11 NOTE — Chronic Care Management (AMB) (Signed)
  Care Management   Note  07/11/2020 Name: [name] MRN: [id number] DOB: 03/06/1929  Megan Carpenter is a 87 y.o. year old female who is a primary care patient of Smitty Cords, DO and is actively engaged with the care management team. I reached out to [name] by phone today to assist with re-scheduling a follow up visit with the Licensed Clinical Social Worker  Follow up plan: Second unsuccessful telephone outreach attempt made. A HIPAA compliant phone message was left for the patient providing contact information and requesting a return call.  The care management team will reach out to the patient again over the next 7 days.  If patient returns call to provider office, please advise to call Embedded Care Management Care Guide Avie Arenas at [phone]  Annabeth Tortora [facility] Guide, Embedded Care Coordination [facility]  Care Management

## 2020-07-18 NOTE — Chronic Care Management (AMB) (Signed)
  Care Management   Note  07/18/2020 Name: Megan Carpenter MRN: [id number] DOB: 10-08-1928  Megan Carpenter is a 87 y.o. year old female who is a primary care patient of Smitty Cords, DO and is actively engaged with the care management team. I reached out to [name] by phone today to assist with re-scheduling a follow up visit with the Licensed Clinical Social Worker  Follow up plan: Third unsuccessful telephone outreach attempt made. A HIPAA compliant phone message was left for the patient providing contact information and requesting a return call.  Unable to make contact on outreach attempts x 3. PCP Karamalegos,Alexander J,DO notified via routed documentation in medical record.  We have been unable to make contact with the patient for follow up. The care management team is available to follow up with the patient after provider conversation with the patient regarding recommendation for care management engagement and subsequent re-referral to the care management team.  If patient returns call to provider office, please advise to call Embedded Care Management Care Guide Avie Arenas at [phone]  Kristi Hyer [facility] Guide, Embedded Care Coordination [facility]  Care Management

## 2020-07-20 NOTE — Progress Notes (Signed)
Additional note, patient was called 3 more times to reschedule by another CCM CG and was unsuccessful

## 2020-10-25 ENCOUNTER — Telehealth: Payer: Self-pay | Admitting: Family Medicine

## 2020-10-25 NOTE — Telephone Encounter (Signed)
Copied from CRM [id number]. Topic: Medicare AWV >> Oct 25, 2020 12:36 PM Claudette Laws R wrote: Reason for CRM:  Left message for patient to call back and schedule Medicare Annual Wellness Visit (AWV) to be done virtually or by telephone.  No hx of AWV eligible as of 04/15/2009 awvi  Please schedule at anytime with [facility].      40 Minutes appointment   Any questions, please call me at [phone]

## 2021-04-29 ENCOUNTER — Encounter: Payer: Self-pay | Admitting: Emergency Medicine

## 2021-04-29 ENCOUNTER — Emergency Department

## 2021-04-29 ENCOUNTER — Emergency Department
Admission: EM | Admit: 2021-04-29 | Discharge: 2021-04-29 | Disposition: A | Discharge status: Skilled Nursing Facility | Attending: Emergency Medicine | Admitting: Emergency Medicine

## 2021-04-29 ENCOUNTER — Other Ambulatory Visit: Payer: Self-pay

## 2021-04-29 DIAGNOSIS — S0101XA Laceration without foreign body of scalp, initial encounter: Secondary | ICD-10-CM | POA: Insufficient documentation

## 2021-04-29 DIAGNOSIS — E039 Hypothyroidism, unspecified: Secondary | ICD-10-CM | POA: Diagnosis not present

## 2021-04-29 DIAGNOSIS — F0390 Unspecified dementia without behavioral disturbance: Secondary | ICD-10-CM | POA: Diagnosis not present

## 2021-04-29 DIAGNOSIS — S199XXA Unspecified injury of neck, initial encounter: Secondary | ICD-10-CM | POA: Insufficient documentation

## 2021-04-29 DIAGNOSIS — Z8673 Personal history of transient ischemic attack (TIA), and cerebral infarction without residual deficits: Secondary | ICD-10-CM

## 2021-04-29 DIAGNOSIS — S0990XA Unspecified injury of head, initial encounter: Secondary | ICD-10-CM | POA: Diagnosis present

## 2021-04-29 DIAGNOSIS — W1789XA Other fall from one level to another, initial encounter: Secondary | ICD-10-CM | POA: Insufficient documentation

## 2021-04-29 DIAGNOSIS — I10 Essential (primary) hypertension: Secondary | ICD-10-CM | POA: Diagnosis not present

## 2021-04-29 MED ORDER — LORAZEPAM 1 MG PO TABS
1.0000 mg | ORAL_TABLET | Freq: Once | ORAL | Status: AC
  Administered 2021-04-29: 1 mg via ORAL
  Filled 2021-04-29: qty 1

## 2021-04-29 MED ORDER — TRAMADOL HCL 50 MG PO TABS: 50.0000 mg | ORAL_TABLET | Freq: Two times a day (BID) | ORAL | 0 refills | Status: AC

## 2021-04-29 MED ORDER — LIDOCAINE-EPINEPHRINE 2 %-1:100000 IJ SOLN
10.0000 mL | Freq: Once | INTRAMUSCULAR | Status: AC
  Administered 2021-04-29: 10 mL via INTRADERMAL
  Filled 2021-04-29: qty 1

## 2021-04-29 NOTE — ED Notes (Signed)
Pt noted to have already removed new dressing.

## 2021-04-29 NOTE — Discharge Instructions (Addendum)
CTs of the head, cervical spine, and lumbar spine do not show any acute fractures.  The staples should be removed in 1 week.   Megan Carpenter should return for any new or recurrent falls, change in mental status, vomiting, severe headache, weakness or numbness, strokelike symptoms, or any other new or worsening symptoms that are concerning.

## 2021-04-29 NOTE — ED Provider Notes (Signed)
[facility] Provider Note    Event Date/Time   First MD Initiated Contact with Patient 04/29/21 [phone]     (approximate)   History   Fall   HPI  Megan Carpenter is a 88 y.o. female with history of dementia, hypertension, hypothyroidism, and GERD who presents with head injury after all at her assisted living facility.  The patient told the nurse that she tripped.  To me she states that her dog got out and she was trying to catch him.  She denies any acute pain or other acute complaints.    Physical Exam   Triage Vital Signs: ED Triage Vitals  Enc Vitals Group     BP 04/29/21 0741 131/73     Pulse Rate 04/29/21 0741 (!) 103     Resp 04/29/21 0741 18     Temp 04/29/21 0741 98.6 F (37 C)     Temp Source 04/29/21 0741 Oral     SpO2 04/29/21 0741 96 %     Weight 04/29/21 0742 125 lb (56.7 kg)     Height 04/29/21 0742 5\' 2"  (1.575 m)     Head Circumference --      Peak Flow --      Pain Score --      Pain Loc --      Pain Edu? --      Excl. in GC? --     Most recent vital signs: Vitals:   04/29/21 1045 04/29/21 1100  BP: 96/73 (!) 141/68  Pulse: 85 81  Resp: 18   Temp:    SpO2: 97% 93%     General: Awake, oriented x2, no distress.  CV:  Good peripheral perfusion.  Resp:  Normal effort.  Abd:  No distention.  Other:  EOMI.  PERRLA.  No facial droop.  Motor and sensory intact in all extremities.  No ataxia.  5 cm hematoma to left parieto-occipital scalp with 1cm superficial laceration.   ED Results / Procedures / Treatments   Labs (all labs ordered are listed, but only abnormal results are displayed) Labs Reviewed - No data to display   EKG    RADIOLOGY  CT head: I reviewed the images and do not see evidence of ICH.  I reviewed the radiology report which shows a possible 2 cm subacute cerebellar infarct but no acute findings.  CT cervical spine: I reviewed the images; there is no evidence of acute fracture  IMPRESSION:  1. 2  cm infarct in the superior left cerebellum, likely subacute.  2. No evidence of intracranial injury or cervical spine fracture.  3. Posterior scalp hematoma without calvarial fracture.   CT lumbar spine: I reviewed the images.  There is no evidence of acute fracture.  I reviewed the radiology report which indicates remote T12 fracture but no acute findings.  IMPRESSION:  1. No acute finding.  2. Remote T12 compression fracture with advanced height loss and  moderate retropulsion. Retropulsed bone posteriorly displaces the  cord.  3. Lumbar spine degeneration with moderate left foraminal stenosis  at L5-S1.     PROCEDURES:  Critical Care performed: No  ..Laceration Repair  Date/Time: 04/29/2021 1:00 PM Performed by: 05/01/2021, MD Authorized by: Dionne Bucy, MD   Consent:    Consent obtained:  Verbal   Consent given by:  Healthcare agent   Risks discussed:  Infection, pain, retained foreign body, poor cosmetic result and poor wound healing   Alternatives discussed:  No treatment Universal  protocol:    Patient identity confirmed:  Arm band and verbally with patient Anesthesia:    Anesthesia method:  Local infiltration   Local anesthetic:  Lidocaine 1% w/o epi Laceration details:    Location:  Scalp   Scalp location:  L parietal   Length (cm):  1 Exploration:    Hemostasis achieved with:  Direct pressure   Contaminated: no   Treatment:    Area cleansed with:  Povidone-iodine Skin repair:    Repair method:  Staples   Number of staples:  3 Approximation:    Approximation:  Close Repair type:    Repair type:  Simple Post-procedure details:    Dressing:  Open (no dressing)   Procedure completion:  Tolerated well, no immediate complications    MEDICATIONS ORDERED IN ED: Medications  lidocaine-EPINEPHrine (XYLOCAINE W/EPI) 2 %-1:100000 (with pres) injection 10 mL (has no administration in time range)  LORazepam (ATIVAN) tablet 1 mg (has no  administration in time range)  LORazepam (ATIVAN) tablet 1 mg (1 mg Oral Given 04/29/21 1037)     IMPRESSION / MDM / ASSESSMENT AND PLAN / ED COURSE  I reviewed the triage vital signs and the nursing notes.  88 year old female with PMH as noted above presents with head injury after an apparent mechanical fall at her facility.  She also has a small skin tear to the left index finger but denies other injuries or any acute pain.  On exam the patient is overall well-appearing and her vital signs are normal.  Neurologic exam is nonfocal.  There is a hematoma and abrasion to left side of her head with a small, superficial laceration.  CT the head and cervical spine show no acute traumatic findings, with a there is a 2 cm cerebellar infarct which may be subacute.  However, the patient has no neurologic deficits.  I consulted Dr. Thomasena Edis from neurology who reviewed the images.  He advises that the infarct is likely subacute, especially given that the patient has no ataxia, however he recommends an MRI for further evaluation.   ----------------------------------------- 12:58 PM on 04/29/2021 -----------------------------------------  I discussed case with the patient's daughter and hospice nurse.  Based on their goals of care and hospice recommendations, they have decided to forego further work-up for possible subacute stroke given the lack of any acute symptoms.  Therefore, I have canceled the MRI.  The patient is now reporting more low back pain.  She does not have any focal tenderness but I obtained a CT to rule out compression fracture.  It is negative for acute findings although she does have a remote T12 fracture.  The patient remains alert and is moving all extremities.  I repaired the laceration.  She is stable for discharge back to her facility.  I gave her thorough return precautions to the daughter and she expressed understanding.    FINAL CLINICAL IMPRESSION(S) / ED DIAGNOSES   Final  diagnoses:  Minor head injury, initial encounter  Laceration of scalp, initial encounter  Prior cerebellar infarct without late effect     Rx / DC Orders   ED Discharge Orders          Ordered    traMADol (ULTRAM) 50 MG tablet  2 times daily        04/29/21 1257             Note:  This document was prepared using Dragon voice recognition software and may include unintentional dictation errors.    Dionne Bucy, MD  04/29/21 1302 ° °

## 2021-04-29 NOTE — ED Notes (Signed)
Pt is continually taking off all dressing placed on L hand skin tear.  3rd dressing placed (xeroform, Kerlix, and tape)

## 2021-04-29 NOTE — ED Triage Notes (Signed)
Pt via EMS from [facility], pt states she tripped. Denies any LOC. Pt does have a hematoma to the posterior head and has a small skin tear to the L index finger. States the back of her head and her finger hurts but denies any other pain. Pt is alert but has a hx of dementia. No blood thinners.

## 2021-04-29 NOTE — ED Triage Notes (Signed)
Pt in via EMS from [facility] with c/o fall. Staff reports no LOC, hematoma to back of head and small skin tear on left index finger. 96% RA, HR 80, 164/80, temp 100.0 oral, FSBS 260. Pt also c/o lower back pain

## 2021-04-29 NOTE — ED Notes (Signed)
Report given to [place] at [facility].  Facility aware she had 3 staples placed to posterior head and a skin tear on L hand.

## 2021-04-29 NOTE — ED Notes (Signed)
Per Hospice, MRI can be cancelled because Pt is on comfort care.  EDP made aware.

## 2023-02-09 ENCOUNTER — Emergency Department

## 2023-02-09 ENCOUNTER — Emergency Department
Admission: EM | Admit: 2023-02-09 | Discharge: 2023-02-10 | Disposition: A | Discharge status: Home / Self Care | Attending: Emergency Medicine | Admitting: Emergency Medicine

## 2023-02-09 ENCOUNTER — Other Ambulatory Visit: Payer: Self-pay

## 2023-02-09 DIAGNOSIS — S0101XA Laceration without foreign body of scalp, initial encounter: Secondary | ICD-10-CM | POA: Diagnosis not present

## 2023-02-09 DIAGNOSIS — S0990XA Unspecified injury of head, initial encounter: Secondary | ICD-10-CM | POA: Diagnosis present

## 2023-02-09 DIAGNOSIS — W01198A Fall on same level from slipping, tripping and stumbling with subsequent striking against other object, initial encounter: Secondary | ICD-10-CM | POA: Diagnosis not present

## 2023-02-09 DIAGNOSIS — F0390 Unspecified dementia without behavioral disturbance: Secondary | ICD-10-CM | POA: Diagnosis not present

## 2023-02-09 DIAGNOSIS — I10 Essential (primary) hypertension: Secondary | ICD-10-CM | POA: Insufficient documentation

## 2023-02-09 DIAGNOSIS — W19XXXA Unspecified fall, initial encounter: Secondary | ICD-10-CM

## 2023-02-09 LAB — CBC WITH DIFFERENTIAL/PLATELET
Abs Immature Granulocytes: 0.12 10*3/uL — ABNORMAL HIGH (ref 0.00–0.07)
Basophils Absolute: 0 10*3/uL (ref 0.0–0.1)
Basophils Relative: 0 %
Eosinophils Absolute: 0.2 10*3/uL (ref 0.0–0.5)
Eosinophils Relative: 2 %
HCT: 40.4 % (ref 36.0–46.0)
Hemoglobin: 13.3 g/dL (ref 12.0–15.0)
Immature Granulocytes: 2 %
Lymphocytes Relative: 25 %
Lymphs Abs: 1.8 10*3/uL (ref 0.7–4.0)
MCH: 29.8 pg (ref 26.0–34.0)
MCHC: 32.9 g/dL (ref 30.0–36.0)
MCV: 90.6 fL (ref 80.0–100.0)
Monocytes Absolute: 0.8 10*3/uL (ref 0.1–1.0)
Monocytes Relative: 11 %
Neutro Abs: 4.2 10*3/uL (ref 1.7–7.7)
Neutrophils Relative %: 60 %
Platelets: 198 10*3/uL (ref 150–400)
RBC: 4.46 MIL/uL (ref 3.87–5.11)
RDW: 12.6 % (ref 11.5–15.5)
WBC: 7.1 10*3/uL (ref 4.0–10.5)
nRBC: 0 % (ref 0.0–0.2)

## 2023-02-09 LAB — COMPREHENSIVE METABOLIC PANEL
ALT: 13 U/L (ref 0–44)
AST: 16 U/L (ref 15–41)
Albumin: 4 g/dL (ref 3.5–5.0)
Alkaline Phosphatase: 94 U/L (ref 38–126)
Anion gap: 9 (ref 5–15)
BUN: 24 mg/dL — ABNORMAL HIGH (ref 8–23)
CO2: 27 mmol/L (ref 22–32)
Calcium: 8.9 mg/dL (ref 8.9–10.3)
Chloride: 102 mmol/L (ref 98–111)
Creatinine, Ser: 0.66 mg/dL (ref 0.44–1.00)
GFR, Estimated: >60 mL/min (ref >60)
Glucose, Bld: 214 mg/dL — ABNORMAL HIGH (ref 70–99)
Potassium: 3.8 mmol/L (ref 3.5–5.1)
Sodium: 138 mmol/L (ref 135–145)
Total Bilirubin: 0.6 mg/dL (ref 0.3–1.2)
Total Protein: 7.2 g/dL (ref 6.5–8.1)

## 2023-02-09 MED ORDER — FENTANYL CITRATE PF 50 MCG/ML IJ SOSY
50.0000 ug | PREFILLED_SYRINGE | Freq: Once | INTRAMUSCULAR | Status: AC
  Administered 2023-02-09: 50 ug via INTRAMUSCULAR
  Filled 2023-02-09: qty 1

## 2023-02-09 NOTE — ED Notes (Signed)
Pt in CT.

## 2023-02-09 NOTE — ED Triage Notes (Signed)
Bleeding on back of head noted- controlled with gauze wrap.

## 2023-02-09 NOTE — Discharge Instructions (Signed)
Please follow-up with your doctor in 7 to 10 days for staple removal.  Please keep the area covered with Neosporin.  Return to the emergency department for any symptom concerning to yourself or staff members.  Use Tylenol for pain and fevers.  Up to 1000 mg per dose, up to 4 times per day.  Do not take more than 4000 mg of Tylenol/acetaminophen within 24 hours.Marland Kitchen

## 2023-02-09 NOTE — ED Triage Notes (Signed)
First Nurse Note:  BIB [facility] from [facility].   Pt reportedly fell backwards and hit her head on the floor. Laceration noted to posterior skull and they put a bandage in place. Denies LOC or daily thinners. Pt has dementia at baseline and facility reported to EMS that she is currently at neurological baseline.   EMS VS:  172/81 HR 73 98% RA

## 2023-02-09 NOTE — ED Provider Notes (Signed)
[facility] Provider Note    Event Date/Time   First MD Initiated Contact with Patient 02/09/23 2145     (approximate)  History   Chief Complaint: Fall  HPI  Megan Carpenter is a 89 y.o. female with a past medical history of dementia, gastric reflux, hypertension presents from a nursing facility after a fall.  According to the patient patient is coming from [facility] where she fell backwards hitting her head on the floor.  Did have a small laceration to the back of the head.  Here the patient is denying any other symptoms continues to state that she needs to go home.  Patient's daughter is here with the patient who is concerned that she could have injured her back or pelvis due to the fall.  Patient continues to try to get up out of bed.  Physical Exam   Triage Vital Signs: ED Triage Vitals [02/09/23 1958]  Encounter Vitals Group     BP (!) 179/70     Systolic BP Percentile      Diastolic BP Percentile      Pulse Rate 69     Resp 17     Temp 97.8 F (36.6 C)     Temp Source Oral     SpO2 94 %     Weight      Height      Head Circumference      Peak Flow      Pain Score 6     Pain Loc      Pain Education      Exclude from Growth Chart     Most recent vital signs: Vitals:   02/09/23 1958  BP: (!) 179/70  Pulse: 69  Resp: 17  Temp: 97.8 F (36.6 C)  SpO2: 94%    General: Awake, no distress.  Small approximate 2 cm laceration to the posterior scalp, hemostatic CV:  Good peripheral perfusion.  Regular rate and rhythm  Resp:  Normal effort.  Equal breath sounds bilaterally.  Abd:  No distention.  Soft, nontender.  No rebound or guarding.  ED Results / Procedures / Treatments   EKG  EKG viewed and interpreted by myself shows a normal sinus rhythm at 67 bpm with a narrow QRS, normal axis, normal intervals, no concerning ST changes.  RADIOLOGY  I have reviewed and interpreted CT head images.  No significant bleed seen on my  evaluation. Radiologist read the CT is negative   MEDICATIONS ORDERED IN ED: Medications - No data to display   IMPRESSION / MDM / ASSESSMENT AND PLAN / ED COURSE  I reviewed the triage vital signs and the nursing notes.  Patient's presentation is most consistent with acute presentation with potential threat to life or bodily function.  Patient presents the emergency department for mechanical fall.  Patient has a history of dementia denies any symptoms.  Patient's labs are reassuring occluding normal CBC reassuring chemistry.  Patient has a small laceration to the occipital scalp hemostatic will repair with 2 staples.  CT scan of the head is reassuring.  On exam however patient does have a shortened left lower extremity compared to her right lower extremity.  Daughter states prior hip replacement on the right lower extremity.  Will obtain left hip x-rays.  Patient does appear to have some minimal discomfort when moving her left leg although not to the degree that I would suspect a fracture however with her dementia makes exam somewhat difficult.  Will also obtain x-ray imaging of the back to ensure no spinal fracture.  Overall the patient appears well.  X-rays are pending.  Patient care signed out to oncoming provider.  LACERATION REPAIR Performed by: Minna Antis Authorized by: Minna Antis Consent: Verbal consent obtained. Risks and benefits: risks, benefits and alternatives were discussed Consent given by: patient Patient identity confirmed: provided demographic data Prepped and Draped in normal sterile fashion Wound explored  Laceration Location: occipital scalp    Laceration Length: 2cm  No Foreign Bodies seen or palpated  Irrigation method: syringe Amount of cleaning: standard  Skin closure: Staples  Number of staples: 2  Patient tolerance: Patient tolerated the procedure well with no immediate complications.   FINAL CLINICAL IMPRESSION(S) / ED DIAGNOSES    Fall Occipital scalp laceration    Note:  This document was prepared using Dragon voice recognition software and may include unintentional dictation errors.   Minna Antis, MD 02/09/23 2258

## 2023-02-09 NOTE — ED Notes (Signed)
Pt was brought back fro radiology by rad tech and I was informed that pt would not be still enough to complete imaging even with multiple personnel assistance.

## 2023-02-10 ENCOUNTER — Emergency Department

## 2023-02-10 DIAGNOSIS — S0101XA Laceration without foreign body of scalp, initial encounter: Secondary | ICD-10-CM | POA: Diagnosis not present

## 2023-02-10 MED ORDER — HYDROMORPHONE HCL 1 MG/ML IJ SOLN
0.5000 mg | Freq: Once | INTRAMUSCULAR | Status: AC
  Administered 2023-02-10: 0.5 mg via INTRAMUSCULAR

## 2023-02-10 MED ORDER — HYDROMORPHONE HCL 1 MG/ML IJ SOLN
0.5000 mg | Freq: Once | INTRAMUSCULAR | Status: DC
  Filled 2023-02-10: qty 0.5

## 2023-02-10 NOTE — ED Notes (Signed)
Radiology was called to attempt imaging again.

## 2023-02-10 NOTE — ED Notes (Signed)
Called [facility], spoke with rep. Chelsea, Coward stated she would put the pt on the list and the EMS would be there shortly.

## 2023-02-10 NOTE — ED Provider Notes (Signed)
Patient received in signout from Dr. Lenard Lance pending plain film imaging of the thoracic and lumbar spine as well as her pelvis and left hip.  Memory care patient with an unwitnessed fall with an occipital laceration requiring 2 staples for reapproximation.  Plain film of the T-spine questions age-indeterminate mild compression deformities of T4 and 5, but she has no tenderness here and this does not clinically correlate. Pelvis and hip question thin rim of lucency of the femoral portion of her prosthetic but patient has no signs of loosening, she is ranging this left hip.  Discussed with daughter at the bedside and she is comfortable with patient returning to facility.  Patient's pain is controlled and suitable for return to memory care facility.   Delton Prairie, MD 02/10/23 [phone]

## 2023-06-02 IMAGING — CT CT CERVICAL SPINE W/O CM
4 of 8 series · 11 of 33 positions shown, 12 images · non-contrast
Comparison: 05/04/2020

CLINICAL DATA: Neck trauma.  Posterior head hematoma



[Series 3: c spine soft · axial · 0.39mm/px · z∈[-171,-127]mm · 2 of 66 slices shown]
[im 22/66  soft-tissue]
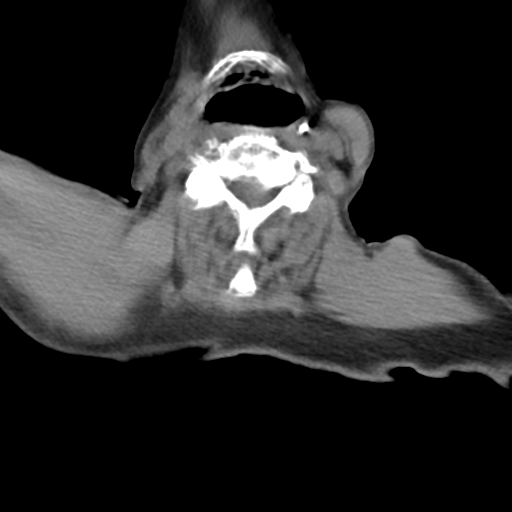
[im 44/66  soft-tissue]
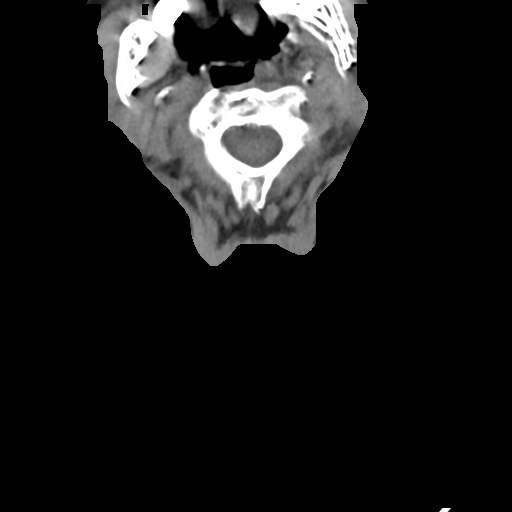

[Series 6: sagittal bone · sagittal · 0.21mm/px · 5 of 49 slices shown]
[im 9/49  bone]
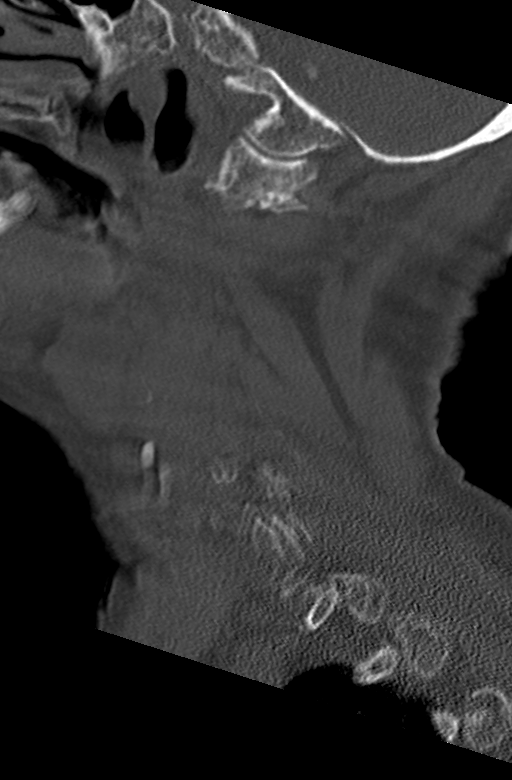
[im 17/49  bone]
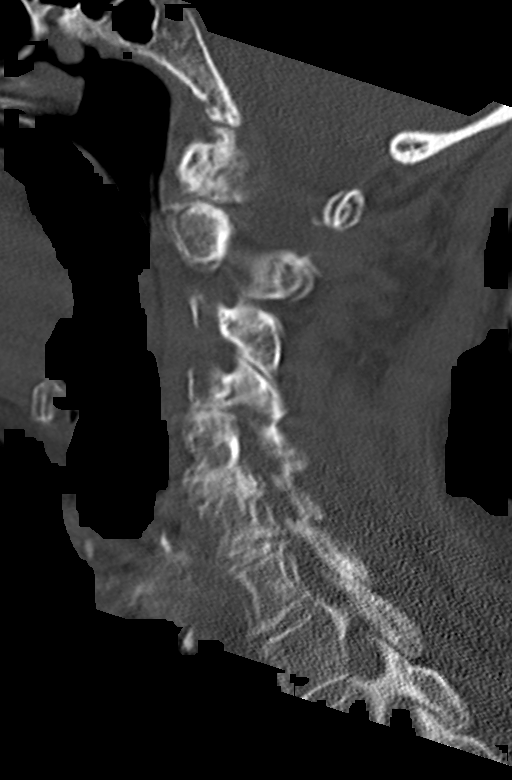
[im 25/49  bone]
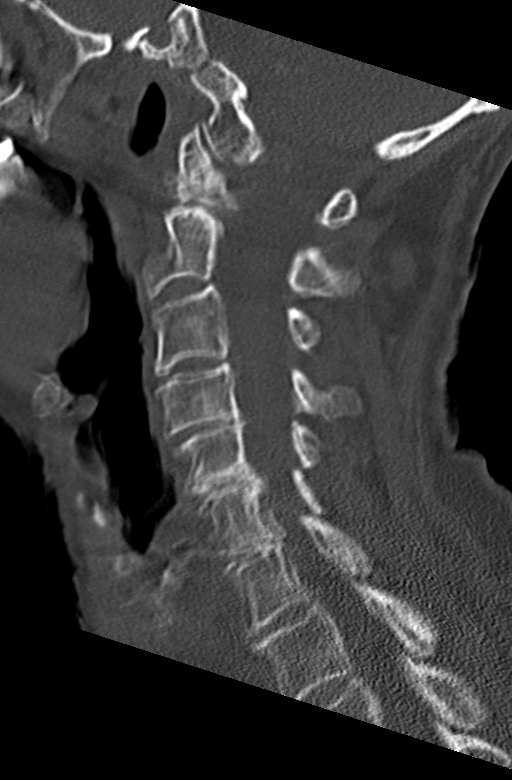
[im 33/49  bone]
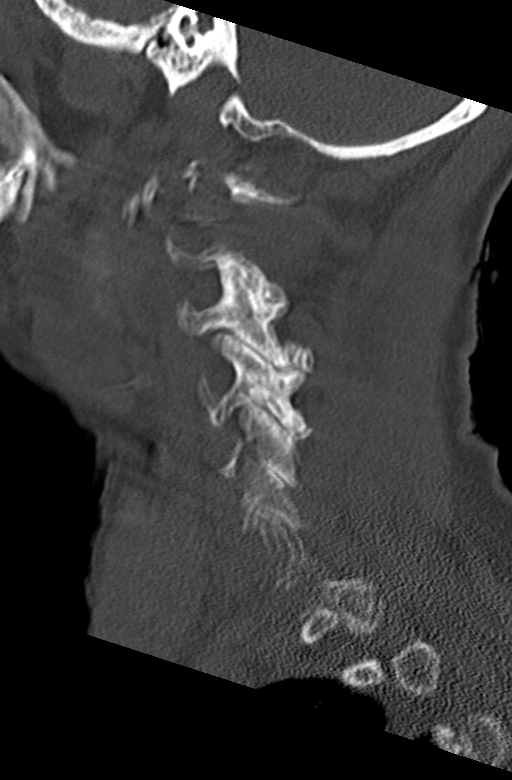
[im 41/49  bone]
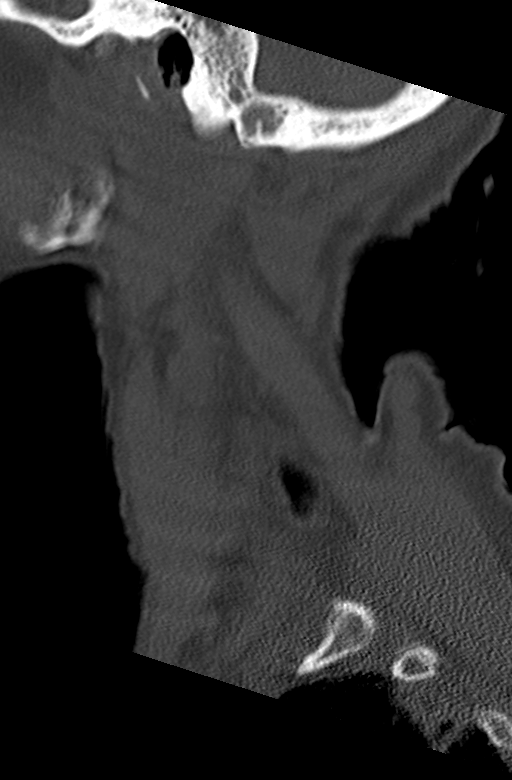

[Series 7: coronal bone · coronal · 0.19mm/px · 2 of 54 slices shown]
[im 24/54  bone]
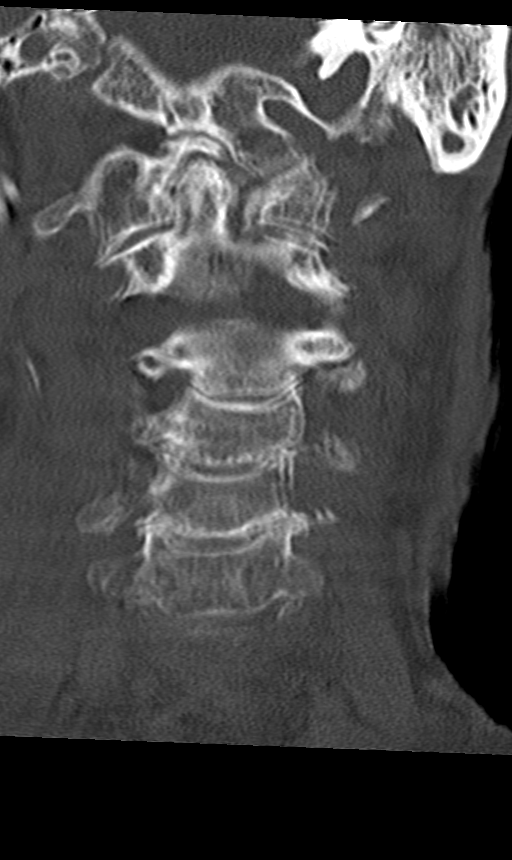
[im 47/54  bone]
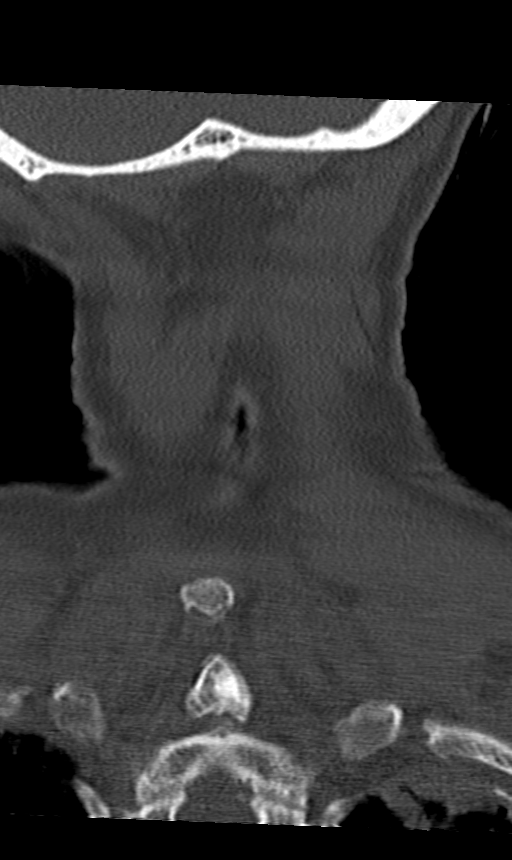

[Series 8: orthogonal bone · axial · 0.21mm/px · z∈[-199,-148]mm · 2 of 82 slices shown, 3 images]
[im 28/82  soft-tissue]
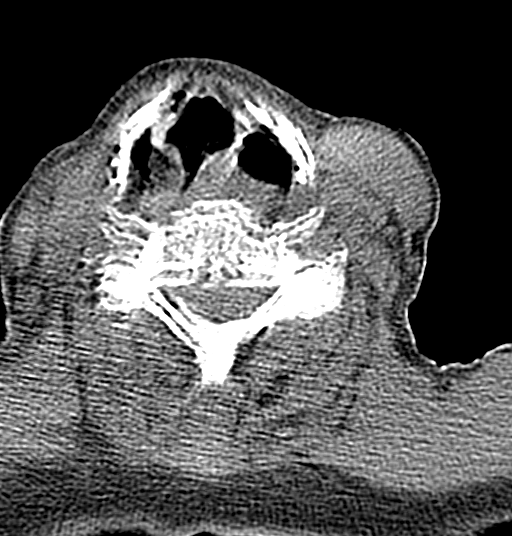
[im 28/82  bone]
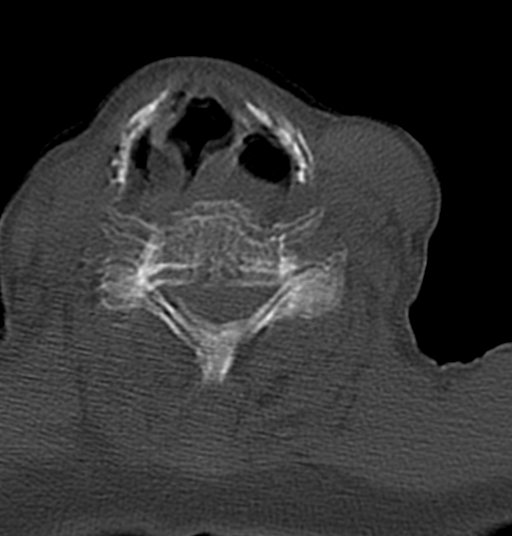
[im 55/82  bone]
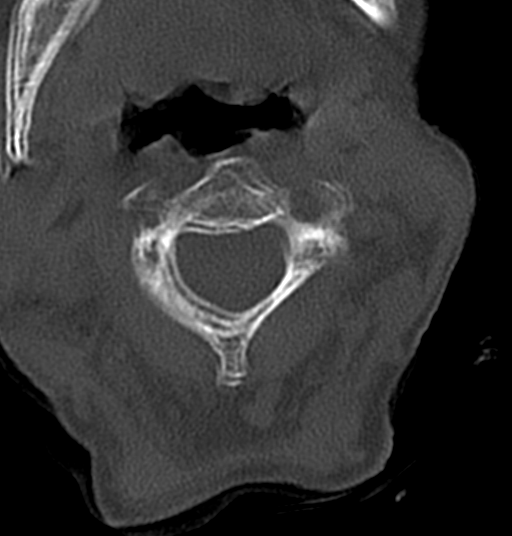

[11 of 33 positions shown; findings below may reference images not displayed]

FINDINGS: CT HEAD FINDINGS

Brain: Approximately 2 cm low-density in the upper left cerebellum
reaching the surface, interval. No volume gain or loss. No acute
hemorrhage, hydrocephalus, or collection. Chronic small vessel
ischemia in the hemispheric white matter that is mild for age. Mild
for age volume loss in the brain.

Vascular: No hyperdense vessel or unexpected calcification.

Skull: Posterior scalp hematoma without fracture.

Sinuses/Orbits: No evidence of injury.  Bilateral cataract resection

CT CERVICAL SPINE FINDINGS

Alignment: Normal.

Skull base and vertebrae: No acute fracture. No primary bone lesion
or focal pathologic process.

Soft tissues and spinal canal: No prevertebral fluid or swelling. No
visible canal hematoma.

Disc levels: Disc space narrowing with endplate and facet spurring
asymmetric to the left.

Upper chest: Biapical pleural based scarring.
IMPRESSION: 1. 2 cm infarct in the superior left cerebellum, likely subacute.
2. No evidence of intracranial injury or cervical spine fracture.
3. Posterior scalp hematoma without calvarial fracture.

## 2024-03-14 ENCOUNTER — Other Ambulatory Visit: Payer: Self-pay

## 2024-03-14 ENCOUNTER — Emergency Department
Admission: EM | Admit: 2024-03-14 | Discharge: 2024-03-14 | Disposition: A | Discharge status: Skilled Nursing Facility | Attending: Emergency Medicine | Admitting: Emergency Medicine

## 2024-03-14 ENCOUNTER — Emergency Department

## 2024-03-14 DIAGNOSIS — S0101XA Laceration without foreign body of scalp, initial encounter: Secondary | ICD-10-CM | POA: Diagnosis present

## 2024-03-14 DIAGNOSIS — G309 Alzheimer's disease, unspecified: Secondary | ICD-10-CM | POA: Insufficient documentation

## 2024-03-14 DIAGNOSIS — H1032 Unspecified acute conjunctivitis, left eye: Secondary | ICD-10-CM | POA: Insufficient documentation

## 2024-03-14 DIAGNOSIS — W01198A Fall on same level from slipping, tripping and stumbling with subsequent striking against other object, initial encounter: Secondary | ICD-10-CM | POA: Insufficient documentation

## 2024-03-14 DIAGNOSIS — I10 Essential (primary) hypertension: Secondary | ICD-10-CM | POA: Diagnosis not present

## 2024-03-14 DIAGNOSIS — W19XXXA Unspecified fall, initial encounter: Secondary | ICD-10-CM

## 2024-03-14 DIAGNOSIS — S0990XA Unspecified injury of head, initial encounter: Secondary | ICD-10-CM

## 2024-03-14 MED ORDER — ERYTHROMYCIN 5 MG/GM OP OINT: 1.0000 | TOPICAL_OINTMENT | Freq: Every day | OPHTHALMIC | 0 refills | Status: AC

## 2024-03-14 NOTE — ED Provider Notes (Signed)
 [facility] Provider Note    Event Date/Time   First MD Initiated Contact with Patient 03/14/24 1009     (approximate)   History   Fall   HPI  Megan Carpenter is a 89 y.o. female with a past medical history of Alzheimer's disease with behavioral disturbance, hypertension, GERD who presents today for evaluation after a fall.  This was a witnessed fall where patient had a mechanical fall while walking backwards and hit her head.  There was no loss of consciousness.  Patient has been able to ambulate since the fall.  She is not had any nausea or vomiting.  She had bleeding from the back of her head.  She is not anticoagulated.  No other injury sustained.  She was not on the ground for a prolonged period of time.  She has otherwise been acting her normal self according to [employer].  Patient Active Problem List   Diagnosis Date Noted   Hip fx (HCC) 05/04/2020   Palliative care patient 08/03/2018   Severe protein-calorie malnutrition 07/02/2016   Impacted cerumen of both ears 03/13/2016   Hearing reduced, bilateral 03/13/2016   Late onset Alzheimer's disease with behavioral disturbance (HCC) 11/27/2015   Elevated hemoglobin A1c 03/28/2015   GERD (gastroesophageal reflux disease) 03/28/2015   Hypertension 03/28/2015   Hypothyroidism 03/28/2015   Osteoporosis 03/28/2015          Physical Exam   Triage Vital Signs: ED Triage Vitals  Encounter Vitals Group     BP 03/14/24 0929 (!) 203/57     Girls Systolic BP Percentile --      Girls Diastolic BP Percentile --      Boys Systolic BP Percentile --      Boys Diastolic BP Percentile --      Pulse Rate 03/14/24 0929 78     Resp 03/14/24 0929 18     Temp 03/14/24 0929 98 F (36.7 C)     Temp src --      SpO2 03/14/24 0929 100 %     Weight 03/14/24 0945 125 lb (56.7 kg)     Height 03/14/24 0945 5' 2 (1.575 m)     Head Circumference --      Peak Flow --      Pain Score 03/14/24 0926 0      Pain Loc --      Pain Education --      Exclude from Growth Chart --     Most recent vital signs: Vitals:   03/14/24 0929 03/14/24 1109  BP: (!) 203/57 (!) 188/60  Pulse: 78 80  Resp: 18 18  Temp: 98 F (36.7 C)   SpO2: 100% 100%    Physical Exam Vitals and nursing note reviewed.  Constitutional:      General: Awake and alert. No acute distress.    Appearance: Normal appearance. The patient is normal weight.  HENT:     Head: Normocephalic. 0.5cm superficial laceration to back of head.  No active bleeding.  No hematoma.    Mouth: Mucous membranes are moist.  Eyes:     General: PERRL. Normal EOMs        Right eye: No discharge.        Left eye: Left eye injection and crusting along the eyelid margin.  No periorbital edema or erythema.  Pupil is round and reactive.  Normal extraocular movements.    Conjunctiva/sclera: Conjunctivae normal.  Cardiovascular:     Rate and  Rhythm: Normal rate and regular rhythm.     Pulses: Normal pulses.  Pulmonary:     Effort: Pulmonary effort is normal. No respiratory distress.     Breath sounds: Normal breath sounds.  Abdominal:     Abdomen is soft. There is no abdominal tenderness. No rebound or guarding. No distention. Musculoskeletal:        General: No swelling. Normal range of motion.     Cervical back: Normal range of motion and neck supple. No midline cervical spine tenderness.  Full range of motion of neck.  Normal strength and sensation in bilateral upper extremities. Normal grip strength bilaterally.  Normal intrinsic muscle function of the hand bilaterally.  Normal radial pulses bilaterally. Skin:    General: Skin is warm and dry.     Capillary Refill: Capillary refill takes less than 2 seconds.  Neurological:     Mental Status: The patient is awake and alert.  No focal neurological deficits.  Moving all 4 extremities normally.  At mental baseline according to daughter at bedside and [facility] staff    ED Results /  Procedures / Treatments   Labs (all labs ordered are listed, but only abnormal results are displayed) Labs Reviewed - No data to display   EKG     RADIOLOGY I independently reviewed and interpreted imaging and agree with radiologists findings.     PROCEDURES:  Critical Care performed:   Procedures   MEDICATIONS ORDERED IN ED: Medications - No data to display   IMPRESSION / MDM / ASSESSMENT AND PLAN / ED COURSE  I reviewed the triage vital signs and the nursing notes.   Differential diagnosis includes, but is not limited to, intracranial hemorrhage, contusion, concussion.  Patient is awake and alert, hemodynamically stable and afebrile.  She is at her mental baseline per her daughter at bedside, as well as per [facility].  Patient is moving all extremities normally and she is able to walk up and down the hallway.  I do not suspect hip fracture.  CT head and neck obtained per [place] criteria are negative for acute traumatic findings.  She does have a small <1cm laceration to her occiput without hematoma or active bleeding, it appears to be superficial.  No need for staples which daughter is in agreement with.  Daughter did notice that she has had some discharge from her left eye and crusting along her eyelashes.  She is requesting ointment for this.  Patient declines having any eye pain or visual changes.  Patient and daughter feel comfortable with discharge home.  We discussed return precautions and outpatient follow-up.  Patient was discharged in stable condition with her daughter.   Patient's presentation is most consistent with acute complicated illness / injury requiring diagnostic workup.   Clinical Course as of 03/14/24 1638  Sun Mar 14, 2024  1018 Discussed with staff at [facility], patient was walking backwards and slipped and hit her head but did not pass out.  She has been able to ambulate since.  She is at her mental baseline. [JP]    Clinical Course  User Index [JP] Renald Haithcock E, PA-C     FINAL CLINICAL IMPRESSION(S) / ED DIAGNOSES   Final diagnoses:  Fall, initial encounter  Injury of head, initial encounter  Laceration of scalp, initial encounter  Acute conjunctivitis of left eye, unspecified acute conjunctivitis type     Rx / DC Orders   ED Discharge Orders  Ordered    erythromycin ophthalmic ointment  Daily at bedtime        03/14/24 1103             Note:  This document was prepared using Dragon voice recognition software and may include unintentional dictation errors.   Taesean Reth E, PA-C 03/14/24 1638    Dorothyann Drivers, MD 03/15/24 2102

## 2024-03-14 NOTE — Discharge Instructions (Addendum)
 You may apply the ointment to your eye as directed.  Your CT scans are normal.  Your laceration did not need to be closed with staples, you may wash your hair normally.  Please return for any new, worsening, or changing symptoms or other concerns.  It was a pleasure caring for you today.

## 2024-03-14 NOTE — ED Triage Notes (Addendum)
 Pt comes with c/o fall via EMS from [facility]. Pt was walking backwards and fell. Pt states she hit her head on wall. No loc or thinners. Pt has dementia.   Pt does have small lac to back of head and no bleeding noted.   VSS per EMS
